# Patient Record
Sex: Female | Born: 1980
Health system: Southern US, Community
[De-identification: ages and names within clinical notes are randomized; demographics above are authoritative.]

## PROBLEM LIST (undated history)

## (undated) DIAGNOSIS — K8020 Calculus of gallbladder without cholecystitis without obstruction: Secondary | ICD-10-CM

## (undated) DIAGNOSIS — D649 Anemia, unspecified: Secondary | ICD-10-CM

---

## 1999-03-10 ENCOUNTER — Emergency Department (HOSPITAL_COMMUNITY): Admission: EM | Admit: 1999-03-10 | Discharge: 1999-03-10 | Payer: Self-pay | Admitting: Emergency Medicine

## 1999-09-17 ENCOUNTER — Emergency Department (HOSPITAL_COMMUNITY): Admission: EM | Admit: 1999-09-17 | Discharge: 1999-09-17 | Payer: Self-pay | Admitting: Emergency Medicine

## 1999-09-23 ENCOUNTER — Emergency Department (HOSPITAL_COMMUNITY): Admission: EM | Admit: 1999-09-23 | Discharge: 1999-09-23 | Payer: Self-pay | Admitting: Emergency Medicine

## 1999-10-28 ENCOUNTER — Inpatient Hospital Stay (HOSPITAL_COMMUNITY): Admission: AD | Admit: 1999-10-28 | Discharge: 1999-10-28 | Payer: Self-pay | Admitting: Obstetrics & Gynecology

## 1999-11-03 ENCOUNTER — Inpatient Hospital Stay (HOSPITAL_COMMUNITY): Admission: AD | Admit: 1999-11-03 | Discharge: 1999-11-03 | Payer: Self-pay | Admitting: Obstetrics & Gynecology

## 1999-11-22 ENCOUNTER — Inpatient Hospital Stay (HOSPITAL_COMMUNITY): Admission: AD | Admit: 1999-11-22 | Discharge: 1999-11-22 | Payer: Self-pay | Admitting: Obstetrics

## 1999-12-21 ENCOUNTER — Inpatient Hospital Stay (HOSPITAL_COMMUNITY): Admission: AD | Admit: 1999-12-21 | Discharge: 1999-12-21 | Payer: Self-pay | Admitting: *Deleted

## 1999-12-23 ENCOUNTER — Inpatient Hospital Stay (HOSPITAL_COMMUNITY): Admission: AD | Admit: 1999-12-23 | Discharge: 1999-12-23 | Payer: Self-pay | Admitting: *Deleted

## 2000-01-17 ENCOUNTER — Inpatient Hospital Stay (HOSPITAL_COMMUNITY): Admission: AD | Admit: 2000-01-17 | Discharge: 2000-01-17 | Payer: Self-pay | Admitting: *Deleted

## 2000-01-17 ENCOUNTER — Encounter: Payer: Self-pay | Admitting: *Deleted

## 2000-03-02 ENCOUNTER — Inpatient Hospital Stay (HOSPITAL_COMMUNITY): Admission: AD | Admit: 2000-03-02 | Discharge: 2000-03-02 | Payer: Self-pay | Admitting: *Deleted

## 2000-04-23 ENCOUNTER — Inpatient Hospital Stay (HOSPITAL_COMMUNITY): Admission: AD | Admit: 2000-04-23 | Discharge: 2000-04-23 | Payer: Self-pay | Admitting: Obstetrics

## 2000-05-22 ENCOUNTER — Inpatient Hospital Stay (HOSPITAL_COMMUNITY): Admission: AD | Admit: 2000-05-22 | Discharge: 2000-05-25 | Payer: Self-pay | Admitting: Obstetrics & Gynecology

## 2000-05-22 ENCOUNTER — Encounter: Payer: Self-pay | Admitting: Obstetrics & Gynecology

## 2000-06-08 ENCOUNTER — Encounter: Admission: RE | Admit: 2000-06-08 | Discharge: 2000-09-06 | Payer: Self-pay | Admitting: Obstetrics

## 2000-10-14 ENCOUNTER — Inpatient Hospital Stay (HOSPITAL_COMMUNITY): Admission: AD | Admit: 2000-10-14 | Discharge: 2000-10-16 | Payer: Self-pay | Admitting: *Deleted

## 2000-10-14 ENCOUNTER — Encounter: Payer: Self-pay | Admitting: Obstetrics and Gynecology

## 2002-06-30 ENCOUNTER — Encounter: Admission: RE | Admit: 2002-06-30 | Discharge: 2002-06-30 | Payer: Self-pay | Admitting: Family Medicine

## 2002-09-22 ENCOUNTER — Encounter: Admission: RE | Admit: 2002-09-22 | Discharge: 2002-09-22 | Payer: Self-pay | Admitting: Family Medicine

## 2003-07-30 ENCOUNTER — Emergency Department (HOSPITAL_COMMUNITY): Admission: EM | Admit: 2003-07-30 | Discharge: 2003-07-30 | Payer: Self-pay | Admitting: Emergency Medicine

## 2003-08-24 ENCOUNTER — Emergency Department (HOSPITAL_COMMUNITY): Admission: EM | Admit: 2003-08-24 | Discharge: 2003-08-25 | Payer: Self-pay | Admitting: Emergency Medicine

## 2004-04-14 ENCOUNTER — Ambulatory Visit: Payer: Self-pay | Admitting: Family Medicine

## 2004-05-05 ENCOUNTER — Ambulatory Visit: Payer: Self-pay | Admitting: Family Medicine

## 2004-05-11 ENCOUNTER — Ambulatory Visit (HOSPITAL_COMMUNITY): Admission: RE | Admit: 2004-05-11 | Discharge: 2004-05-11 | Payer: Self-pay | Admitting: *Deleted

## 2004-06-24 ENCOUNTER — Ambulatory Visit: Payer: Self-pay | Admitting: Family Medicine

## 2004-07-06 ENCOUNTER — Ambulatory Visit (HOSPITAL_COMMUNITY): Admission: RE | Admit: 2004-07-06 | Discharge: 2004-07-06 | Payer: Self-pay | Admitting: *Deleted

## 2004-10-13 ENCOUNTER — Ambulatory Visit: Payer: Self-pay | Admitting: Family Medicine

## 2004-10-13 ENCOUNTER — Encounter: Payer: Self-pay | Admitting: Family Medicine

## 2004-12-02 ENCOUNTER — Inpatient Hospital Stay (HOSPITAL_COMMUNITY): Admission: AD | Admit: 2004-12-02 | Discharge: 2004-12-02 | Payer: Self-pay | Admitting: Obstetrics & Gynecology

## 2004-12-12 ENCOUNTER — Inpatient Hospital Stay (HOSPITAL_COMMUNITY): Admission: AD | Admit: 2004-12-12 | Discharge: 2004-12-12 | Payer: Self-pay | Admitting: *Deleted

## 2004-12-26 ENCOUNTER — Inpatient Hospital Stay (HOSPITAL_COMMUNITY): Admission: AD | Admit: 2004-12-26 | Discharge: 2004-12-26 | Payer: Self-pay | Admitting: Family Medicine

## 2004-12-29 ENCOUNTER — Encounter (INDEPENDENT_AMBULATORY_CARE_PROVIDER_SITE_OTHER): Payer: Self-pay | Admitting: *Deleted

## 2004-12-29 LAB — CONVERTED CEMR LAB

## 2005-01-10 ENCOUNTER — Ambulatory Visit: Payer: Self-pay | Admitting: Family Medicine

## 2005-01-17 ENCOUNTER — Ambulatory Visit: Payer: Self-pay | Admitting: Sports Medicine

## 2005-02-21 ENCOUNTER — Ambulatory Visit: Payer: Self-pay | Admitting: Family Medicine

## 2005-03-10 ENCOUNTER — Ambulatory Visit: Payer: Self-pay | Admitting: Certified Nurse Midwife

## 2005-03-10 ENCOUNTER — Inpatient Hospital Stay (HOSPITAL_COMMUNITY): Admission: AD | Admit: 2005-03-10 | Discharge: 2005-03-10 | Payer: Self-pay | Admitting: Family Medicine

## 2005-03-17 ENCOUNTER — Ambulatory Visit (HOSPITAL_COMMUNITY): Admission: RE | Admit: 2005-03-17 | Discharge: 2005-03-17 | Payer: Self-pay | Admitting: Family Medicine

## 2005-03-21 ENCOUNTER — Ambulatory Visit: Payer: Self-pay | Admitting: Family Medicine

## 2005-04-01 ENCOUNTER — Inpatient Hospital Stay (HOSPITAL_COMMUNITY): Admission: AD | Admit: 2005-04-01 | Discharge: 2005-04-02 | Payer: Self-pay | Admitting: *Deleted

## 2005-04-05 ENCOUNTER — Inpatient Hospital Stay (HOSPITAL_COMMUNITY): Admission: AD | Admit: 2005-04-05 | Discharge: 2005-04-05 | Payer: Self-pay | Admitting: Obstetrics and Gynecology

## 2005-04-17 ENCOUNTER — Ambulatory Visit: Payer: Self-pay | Admitting: Family Medicine

## 2005-04-21 ENCOUNTER — Ambulatory Visit: Payer: Self-pay | Admitting: Family Medicine

## 2005-05-01 ENCOUNTER — Ambulatory Visit: Payer: Self-pay | Admitting: Sports Medicine

## 2005-05-17 ENCOUNTER — Ambulatory Visit: Payer: Self-pay | Admitting: Family Medicine

## 2005-05-30 ENCOUNTER — Ambulatory Visit: Payer: Self-pay | Admitting: Sports Medicine

## 2005-06-06 ENCOUNTER — Inpatient Hospital Stay (HOSPITAL_COMMUNITY): Admission: AD | Admit: 2005-06-06 | Discharge: 2005-06-06 | Payer: Self-pay | Admitting: Family Medicine

## 2005-06-09 ENCOUNTER — Ambulatory Visit: Payer: Self-pay | Admitting: Family Medicine

## 2005-06-12 ENCOUNTER — Inpatient Hospital Stay (HOSPITAL_COMMUNITY): Admission: AD | Admit: 2005-06-12 | Discharge: 2005-06-12 | Payer: Self-pay | Admitting: Family Medicine

## 2005-06-12 ENCOUNTER — Ambulatory Visit: Payer: Self-pay | Admitting: *Deleted

## 2005-06-14 ENCOUNTER — Ambulatory Visit: Payer: Self-pay | Admitting: Family Medicine

## 2005-06-16 ENCOUNTER — Ambulatory Visit: Payer: Self-pay | Admitting: Sports Medicine

## 2005-06-22 ENCOUNTER — Ambulatory Visit: Payer: Self-pay | Admitting: Sports Medicine

## 2005-06-27 ENCOUNTER — Ambulatory Visit: Payer: Self-pay | Admitting: *Deleted

## 2005-06-28 ENCOUNTER — Ambulatory Visit: Payer: Self-pay | Admitting: Family Medicine

## 2005-06-29 ENCOUNTER — Ambulatory Visit: Payer: Self-pay | Admitting: Obstetrics & Gynecology

## 2005-07-03 ENCOUNTER — Ambulatory Visit (HOSPITAL_COMMUNITY): Admission: RE | Admit: 2005-07-03 | Discharge: 2005-07-03 | Payer: Self-pay | Admitting: Family Medicine

## 2005-07-03 ENCOUNTER — Ambulatory Visit: Payer: Self-pay | Admitting: Obstetrics and Gynecology

## 2005-07-05 ENCOUNTER — Ambulatory Visit: Payer: Self-pay | Admitting: Family Medicine

## 2005-07-06 ENCOUNTER — Inpatient Hospital Stay (HOSPITAL_COMMUNITY): Admission: AD | Admit: 2005-07-06 | Discharge: 2005-07-09 | Payer: Self-pay | Admitting: Obstetrics & Gynecology

## 2005-07-06 ENCOUNTER — Ambulatory Visit: Payer: Self-pay | Admitting: Obstetrics & Gynecology

## 2005-07-06 ENCOUNTER — Encounter (INDEPENDENT_AMBULATORY_CARE_PROVIDER_SITE_OTHER): Payer: Self-pay | Admitting: Specialist

## 2005-07-11 ENCOUNTER — Ambulatory Visit: Payer: Self-pay | Admitting: *Deleted

## 2005-08-16 ENCOUNTER — Ambulatory Visit: Payer: Self-pay | Admitting: Sports Medicine

## 2006-08-23 DIAGNOSIS — D649 Anemia, unspecified: Secondary | ICD-10-CM | POA: Insufficient documentation

## 2006-08-23 DIAGNOSIS — Z87442 Personal history of urinary calculi: Secondary | ICD-10-CM | POA: Insufficient documentation

## 2006-08-23 DIAGNOSIS — F410 Panic disorder [episodic paroxysmal anxiety] without agoraphobia: Secondary | ICD-10-CM | POA: Insufficient documentation

## 2006-08-24 ENCOUNTER — Encounter (INDEPENDENT_AMBULATORY_CARE_PROVIDER_SITE_OTHER): Payer: Self-pay | Admitting: *Deleted

## 2007-04-10 ENCOUNTER — Encounter (INDEPENDENT_AMBULATORY_CARE_PROVIDER_SITE_OTHER): Payer: Self-pay | Admitting: Gynecology

## 2007-04-10 ENCOUNTER — Ambulatory Visit: Payer: Self-pay | Admitting: Gynecology

## 2008-07-21 ENCOUNTER — Inpatient Hospital Stay (HOSPITAL_COMMUNITY): Admission: AD | Admit: 2008-07-21 | Discharge: 2008-07-21 | Payer: Self-pay | Admitting: Obstetrics & Gynecology

## 2008-07-22 ENCOUNTER — Ambulatory Visit: Payer: Self-pay | Admitting: Obstetrics and Gynecology

## 2008-07-22 ENCOUNTER — Inpatient Hospital Stay (HOSPITAL_COMMUNITY): Admission: AD | Admit: 2008-07-22 | Discharge: 2008-07-22 | Payer: Self-pay | Admitting: Obstetrics & Gynecology

## 2008-09-08 ENCOUNTER — Ambulatory Visit: Payer: Self-pay | Admitting: Family Medicine

## 2008-09-08 ENCOUNTER — Encounter: Payer: Self-pay | Admitting: Family Medicine

## 2008-09-08 LAB — CONVERTED CEMR LAB
Antibody Screen: NEGATIVE
Basophils Absolute: 0 10*3/uL (ref 0.0–0.1)
Basophils Relative: 0 % (ref 0–1)
Eosinophils Absolute: 0.4 10*3/uL (ref 0.0–0.7)
Eosinophils Relative: 3 % (ref 0–5)
HCT: 35.5 % — ABNORMAL LOW (ref 36.0–46.0)
Hemoglobin: 12.1 g/dL (ref 12.0–15.0)
Hepatitis B Surface Ag: NEGATIVE
Lymphocytes Relative: 15 % (ref 12–46)
Lymphs Abs: 2.2 10*3/uL (ref 0.7–4.0)
MCHC: 34.1 g/dL (ref 30.0–36.0)
MCV: 88.1 fL (ref 78.0–100.0)
Monocytes Absolute: 0.4 10*3/uL (ref 0.1–1.0)
Monocytes Relative: 3 % (ref 3–12)
Neutro Abs: 11.3 10*3/uL — ABNORMAL HIGH (ref 1.7–7.7)
Neutrophils Relative %: 79 % — ABNORMAL HIGH (ref 43–77)
Platelets: 280 10*3/uL (ref 150–400)
RBC: 4.03 M/uL (ref 3.87–5.11)
RDW: 14.9 % (ref 11.5–15.5)
Rh Type: POSITIVE
Rubella: 58.2 intl units/mL — ABNORMAL HIGH
Sickle Cell Screen: NEGATIVE
WBC: 14.3 10*3/uL — ABNORMAL HIGH (ref 4.0–10.5)

## 2008-09-09 ENCOUNTER — Ambulatory Visit: Payer: Self-pay | Admitting: Family Medicine

## 2008-09-09 ENCOUNTER — Telehealth: Payer: Self-pay | Admitting: Family Medicine

## 2008-09-09 ENCOUNTER — Encounter: Payer: Self-pay | Admitting: Family Medicine

## 2008-09-21 ENCOUNTER — Encounter: Payer: Self-pay | Admitting: Family Medicine

## 2008-09-21 ENCOUNTER — Ambulatory Visit: Payer: Self-pay | Admitting: Family Medicine

## 2008-09-21 LAB — CONVERTED CEMR LAB
Chlamydia, DNA Probe: NEGATIVE
GC Probe Amp, Genital: NEGATIVE
Whiff Test: NEGATIVE

## 2008-09-24 ENCOUNTER — Encounter: Payer: Self-pay | Admitting: Family Medicine

## 2008-09-25 ENCOUNTER — Encounter: Payer: Self-pay | Admitting: Family Medicine

## 2008-10-08 ENCOUNTER — Encounter: Payer: Self-pay | Admitting: Family Medicine

## 2008-10-08 ENCOUNTER — Encounter: Payer: Self-pay | Admitting: *Deleted

## 2008-10-08 ENCOUNTER — Ambulatory Visit: Payer: Self-pay | Admitting: Family Medicine

## 2008-10-15 ENCOUNTER — Telehealth: Payer: Self-pay | Admitting: *Deleted

## 2008-10-21 ENCOUNTER — Telehealth (INDEPENDENT_AMBULATORY_CARE_PROVIDER_SITE_OTHER): Payer: Self-pay | Admitting: *Deleted

## 2008-10-23 ENCOUNTER — Ambulatory Visit (HOSPITAL_COMMUNITY): Admission: RE | Admit: 2008-10-23 | Discharge: 2008-10-23 | Payer: Self-pay | Admitting: Family Medicine

## 2008-11-05 ENCOUNTER — Encounter: Payer: Self-pay | Admitting: Family Medicine

## 2008-11-05 ENCOUNTER — Ambulatory Visit: Payer: Self-pay | Admitting: Family Medicine

## 2008-12-22 ENCOUNTER — Ambulatory Visit: Payer: Self-pay | Admitting: Family Medicine

## 2008-12-22 ENCOUNTER — Encounter: Payer: Self-pay | Admitting: Family Medicine

## 2008-12-25 ENCOUNTER — Encounter: Payer: Self-pay | Admitting: Family Medicine

## 2008-12-25 ENCOUNTER — Ambulatory Visit (HOSPITAL_COMMUNITY): Admission: RE | Admit: 2008-12-25 | Discharge: 2008-12-25 | Payer: Self-pay | Admitting: Family Medicine

## 2008-12-30 ENCOUNTER — Encounter: Payer: Self-pay | Admitting: Family Medicine

## 2008-12-30 ENCOUNTER — Ambulatory Visit: Payer: Self-pay | Admitting: Family Medicine

## 2009-01-05 ENCOUNTER — Ambulatory Visit: Payer: Self-pay | Admitting: Family Medicine

## 2009-01-05 ENCOUNTER — Encounter: Payer: Self-pay | Admitting: Family Medicine

## 2009-01-06 ENCOUNTER — Telehealth: Payer: Self-pay | Admitting: *Deleted

## 2009-01-08 ENCOUNTER — Ambulatory Visit: Payer: Self-pay | Admitting: Family Medicine

## 2009-01-08 ENCOUNTER — Encounter: Payer: Self-pay | Admitting: Family Medicine

## 2009-01-08 LAB — CONVERTED CEMR LAB
Bilirubin Urine: NEGATIVE
Blood in Urine, dipstick: NEGATIVE
Glucose, Urine, Semiquant: 500
Ketones, urine, test strip: NEGATIVE
Nitrite: NEGATIVE
Protein, U semiquant: 30
Specific Gravity, Urine: 1.025
Urobilinogen, UA: 0.2
WBC Urine, dipstick: NEGATIVE
pH: 6

## 2009-01-11 ENCOUNTER — Encounter: Payer: Self-pay | Admitting: *Deleted

## 2009-01-19 ENCOUNTER — Encounter: Payer: Self-pay | Admitting: Family Medicine

## 2009-01-19 ENCOUNTER — Ambulatory Visit: Payer: Self-pay | Admitting: Family Medicine

## 2009-01-20 ENCOUNTER — Encounter: Payer: Self-pay | Admitting: Family Medicine

## 2009-01-20 ENCOUNTER — Ambulatory Visit (HOSPITAL_COMMUNITY): Admission: RE | Admit: 2009-01-20 | Discharge: 2009-01-20 | Payer: Self-pay | Admitting: Internal Medicine

## 2009-01-25 ENCOUNTER — Encounter: Payer: Self-pay | Admitting: Family Medicine

## 2009-01-25 ENCOUNTER — Ambulatory Visit: Payer: Self-pay | Admitting: Obstetrics & Gynecology

## 2009-01-25 LAB — CONVERTED CEMR LAB
Chlamydia, DNA Probe: NEGATIVE
GC Probe Amp, Genital: NEGATIVE
HCT: 37.3 % (ref 36.0–46.0)
Hemoglobin: 12.6 g/dL (ref 12.0–15.0)
MCHC: 33.8 g/dL (ref 30.0–36.0)
MCV: 85.4 fL (ref 78.0–100.0)
Platelets: 248 10*3/uL (ref 150–400)
RBC: 4.37 M/uL (ref 3.87–5.11)
RDW: 17 % — ABNORMAL HIGH (ref 11.5–15.5)
WBC: 12.2 10*3/uL — ABNORMAL HIGH (ref 4.0–10.5)

## 2009-01-26 ENCOUNTER — Encounter: Payer: Self-pay | Admitting: Family Medicine

## 2009-02-01 ENCOUNTER — Ambulatory Visit: Payer: Self-pay | Admitting: Obstetrics & Gynecology

## 2009-02-03 ENCOUNTER — Telehealth (INDEPENDENT_AMBULATORY_CARE_PROVIDER_SITE_OTHER): Payer: Self-pay | Admitting: Family Medicine

## 2009-02-03 ENCOUNTER — Ambulatory Visit (HOSPITAL_COMMUNITY): Admission: RE | Admit: 2009-02-03 | Discharge: 2009-02-03 | Payer: Self-pay | Admitting: Family Medicine

## 2009-02-05 ENCOUNTER — Ambulatory Visit: Payer: Self-pay | Admitting: Obstetrics & Gynecology

## 2009-02-08 ENCOUNTER — Ambulatory Visit: Payer: Self-pay | Admitting: Cardiology

## 2009-02-08 ENCOUNTER — Ambulatory Visit: Payer: Self-pay | Admitting: Family Medicine

## 2009-02-10 ENCOUNTER — Ambulatory Visit: Payer: Self-pay

## 2009-02-10 ENCOUNTER — Ambulatory Visit: Payer: Self-pay | Admitting: Advanced Practice Midwife

## 2009-02-10 ENCOUNTER — Encounter: Payer: Self-pay | Admitting: Cardiology

## 2009-02-11 ENCOUNTER — Inpatient Hospital Stay (HOSPITAL_COMMUNITY): Admission: RE | Admit: 2009-02-11 | Discharge: 2009-02-13 | Payer: Self-pay | Admitting: Obstetrics & Gynecology

## 2009-02-20 ENCOUNTER — Inpatient Hospital Stay (HOSPITAL_COMMUNITY): Admission: AD | Admit: 2009-02-20 | Discharge: 2009-02-20 | Payer: Self-pay | Admitting: Obstetrics & Gynecology

## 2009-03-31 ENCOUNTER — Ambulatory Visit: Admission: RE | Admit: 2009-03-31 | Discharge: 2009-03-31 | Payer: Self-pay | Admitting: Obstetrics & Gynecology

## 2009-12-29 ENCOUNTER — Emergency Department (HOSPITAL_COMMUNITY): Admission: EM | Admit: 2009-12-29 | Discharge: 2009-12-29 | Payer: Self-pay | Admitting: Emergency Medicine

## 2010-04-29 ENCOUNTER — Encounter: Payer: Self-pay | Admitting: Family Medicine

## 2010-06-23 IMAGING — US US OB FOLLOW-UP
2 series · 14 of 28 positions shown · non-contrast
Comparison: none

OBSTETRICAL ULTRASOUND:
 This ultrasound exam was performed in the [HOSPITAL] Ultrasound Department.  The OB US report was generated in the AS system, and faxed to the ordering physician.  This report is also available in [REDACTED] PACS.

[Series 1: us ob follow up · 0.24mm/px · 13 of 30 slices shown (1 of 2)]
[im 2/30]
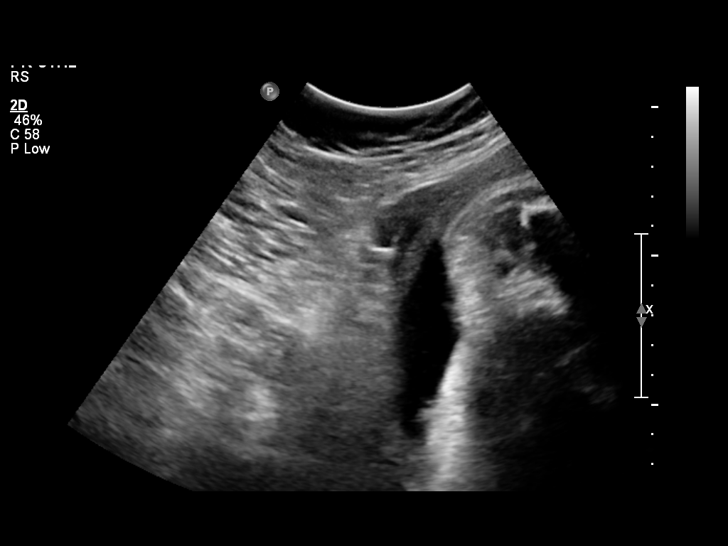
[im 4/30]
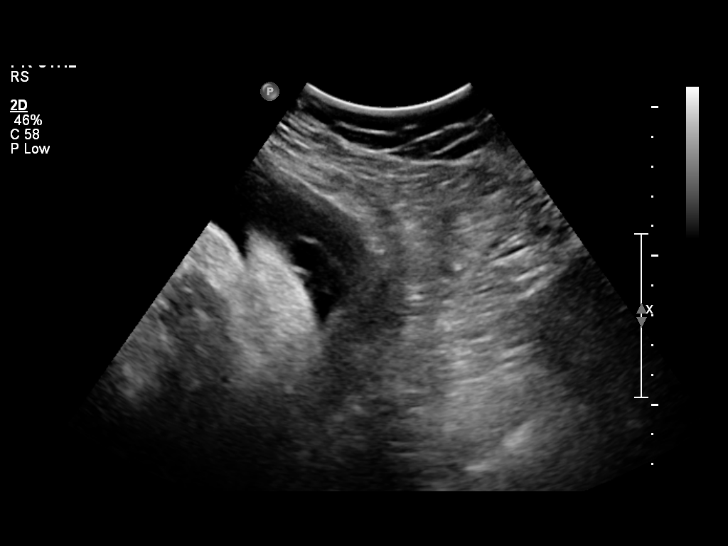
[im 6/30]
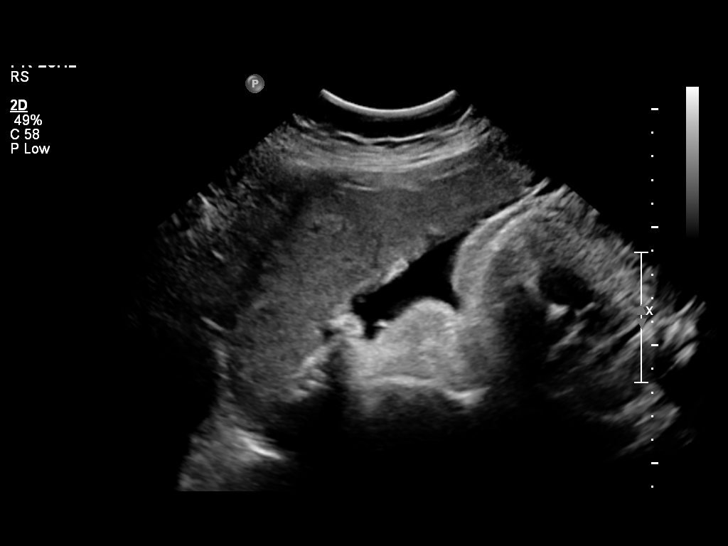
[im 9/30]
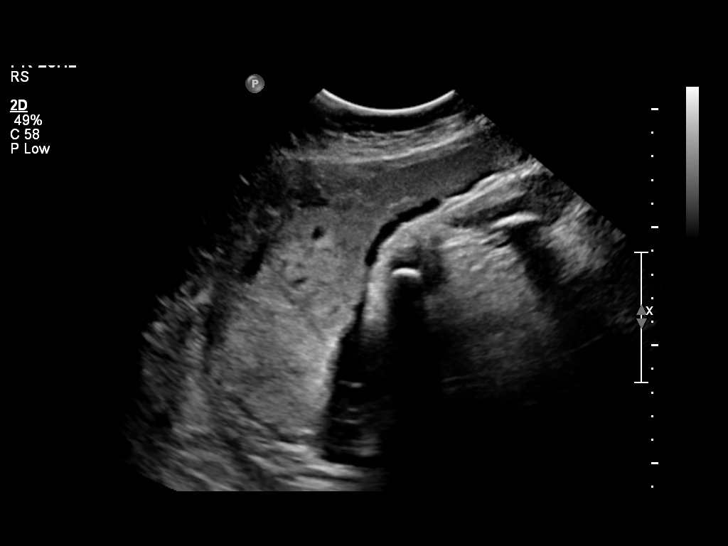
[im 11/30]
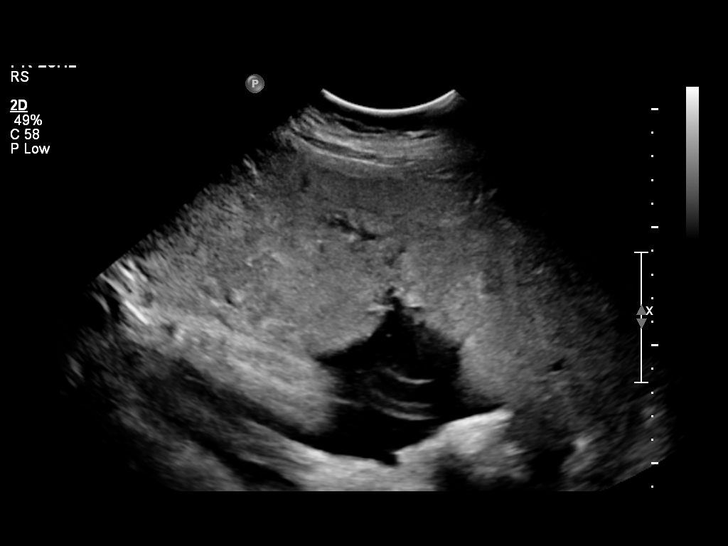
[im 13/30]
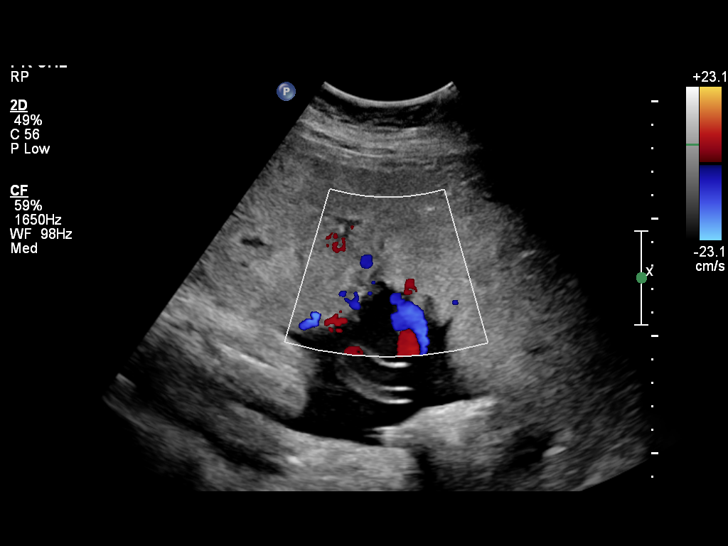
[im 16/30]
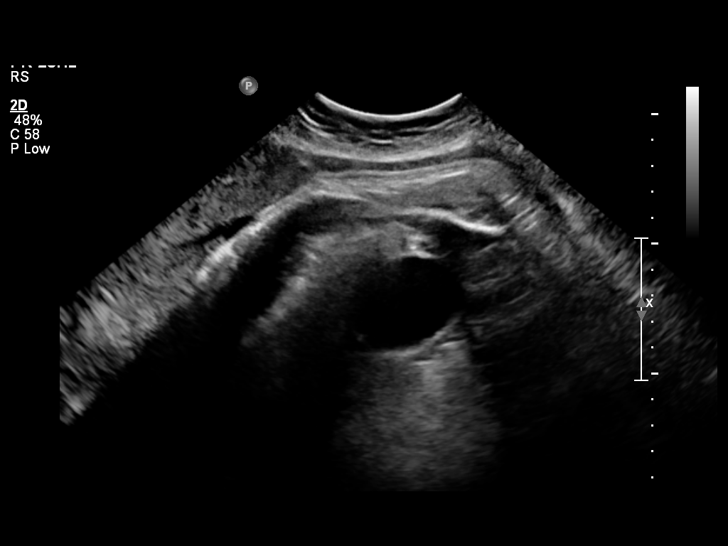
[im 18/30]
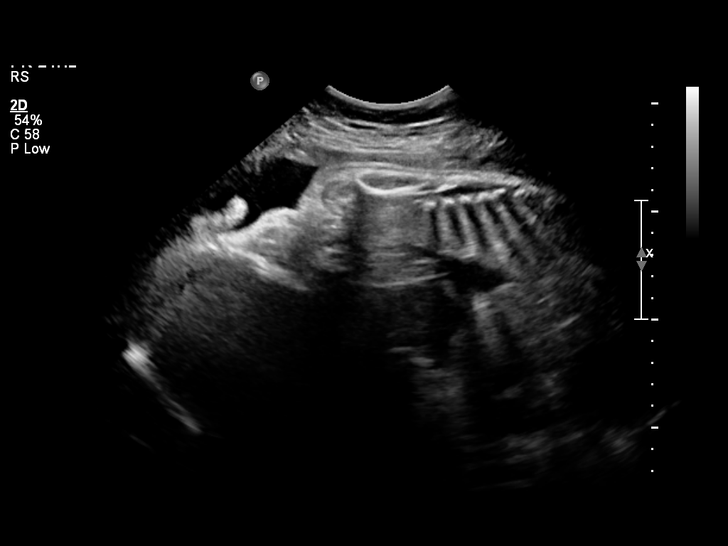
[im 20/30]
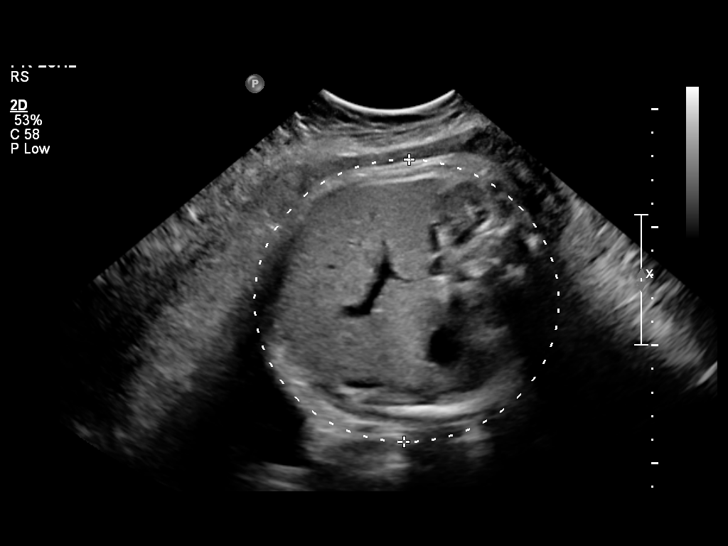
[im 23/30]
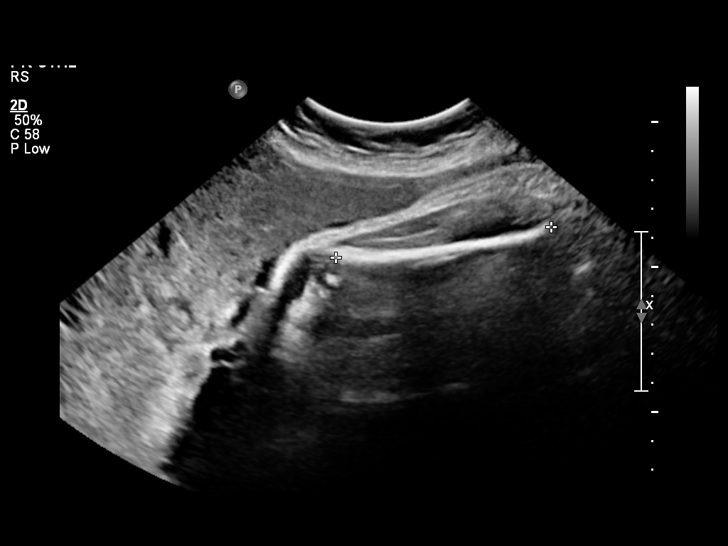
[im 25/30]
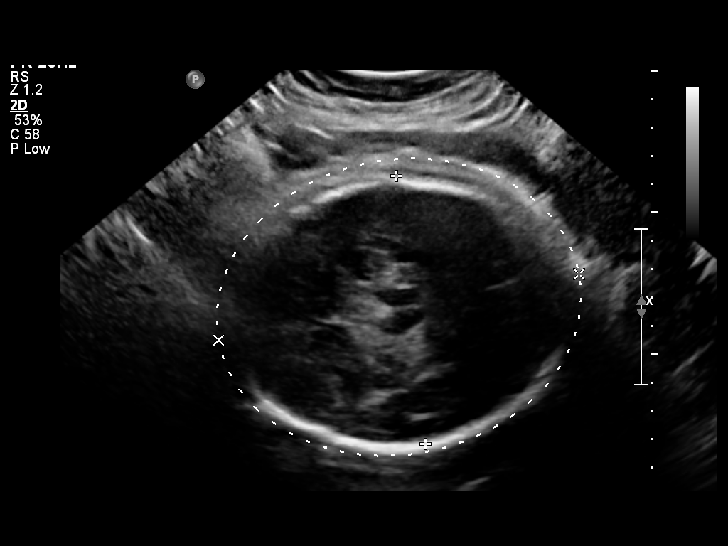
[im 27/30]
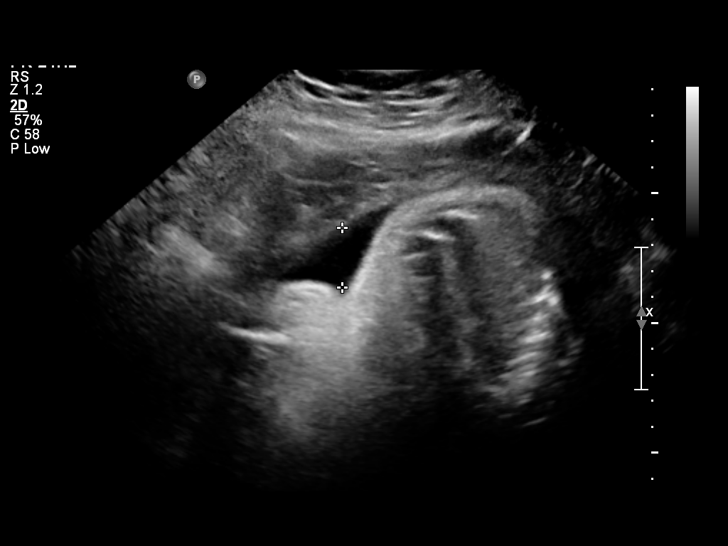
[im 30/30]
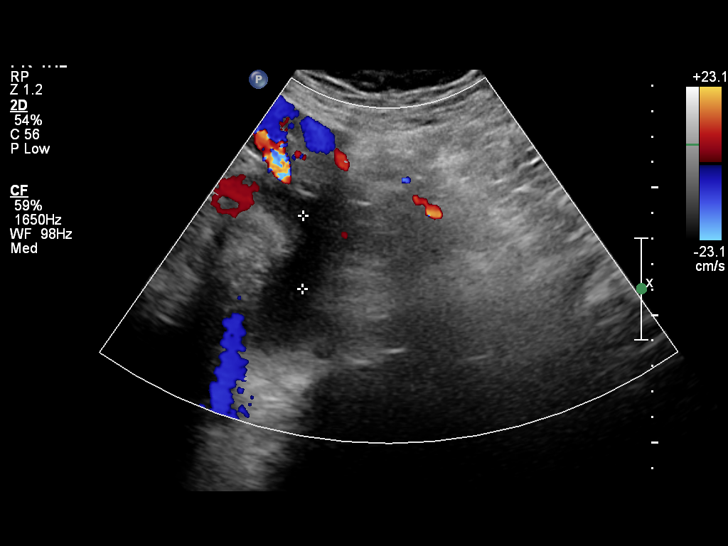

[Series 1: us ob follow up · 0.25mm/px · 1 of 2 slices shown (2 of 2)]
[im 2/2]
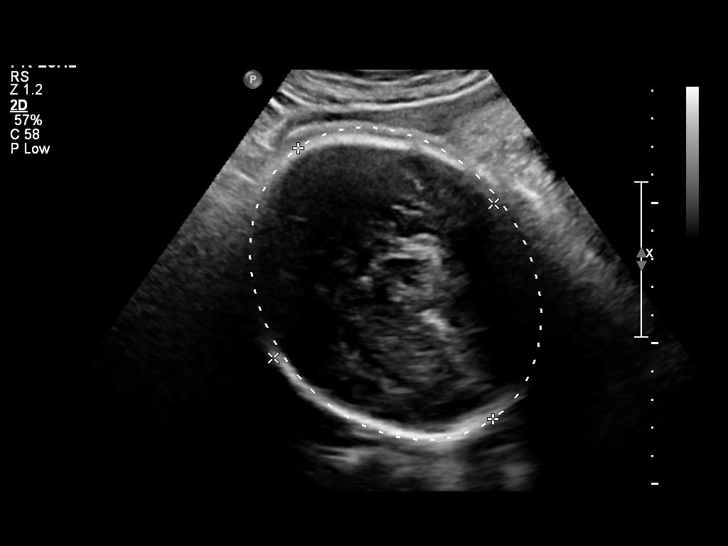

[14 of 28 positions shown; findings below may reference images not displayed]

IMPRESSION: See AS Obstetric US report.

## 2010-07-17 ENCOUNTER — Encounter: Payer: Self-pay | Admitting: Family Medicine

## 2010-07-17 ENCOUNTER — Encounter: Payer: Self-pay | Admitting: *Deleted

## 2010-07-26 NOTE — Assessment & Plan Note (Signed)
Summary: no fetal movement in 2 days. 17 wks preg   Vital Signs:  Patient profile:   30 year old female Weight:      233.1 pounds BP sitting:   111 / 63  (left arm)  Vitals Entered By: Starleen Blue RN (September 09, 2008 1:55 PM) CC: no fetal movement Is Patient Diabetic? No Pain Assessment Patient in pain? no        History of Present Illness: 30 yo G3P2002 (one birth by c/s was  ~3 weeks early so possible one preterm) presents at 92 2/7 wks by 1st trimester U/s on 1/26 - EDC 02/10/09 (not concordant with her LMP of 05/19/08) for no fetal movement.  She states that about 2-3 weeks ago she started feeling some fluttering in her abdomen and felt that every day until 2 days ago - since has not felt any movement in this area.  It felt previously like her other babies did when they were moving in utero.  No vaginal bleeding, leakage of fluid (some mucus only).  No abdominal cramping past 2 days.  First OB appointment is at end of month with Dr. Lelon Perla.  Physical Exam  General:  Well-developed, obese, NAD, pleasant Abdomen:  soft, gravid. Feel fundus above umbilicus - ? large for dates.  FHTs 155 without irregularity.   Impression & Recommendations:  Problem # 1:  DECR FETAL MOVMNTS MGMT MOTH ANTPRTM COND/COMP (ICD-655.73) Assessment New  Doppler fetal heart with strong heartbeat at 155 bpm.  Still early in pregnancy to feel fetal movement - ? if she felt gas though could be intermittent fetal movement.  Advised her not to worry at this point unless vaginal bleeding, leakage of fluid, intense abdominal pain.  Keep appt with Dr. Lelon Perla.  Orders: [facility]- Est Level  3 (16109)  Patient Instructions: 1)  If you develop any of the following, [facility]: 2)  1. Contractions that are regularly spaced 3)  2. Any vaginal bleeding 4)  3. If your water broke or you feel a big gush of fluid 5)  Otherwise keep your appointment at the end of the month with Dr.  Lelon Perla. 6)  It may be days to a few weeks before you feel the baby move regularly but the heart rate is normal and the baby sounds great! 7)  The medication list was reviewed and reconciled.  All changed / newly prescribed medications were explained.  A complete medication list was provided to the patient / caregiver.       OB Initial Intake Information    Race: Hispanic    Marital status: Married   Marketing executive for Follow-up Visit    Weight:     233.1    Blood pressure:   111 / 63

## 2010-07-26 NOTE — Letter (Signed)
Summary: [facility] Obstetrics Management Note  Womens Obstetrics Management Note   Imported By: Roderic Ovens 03/08/2009 16:36:15  _____________________________________________________________________  External Attachment:    Type:   Image     Comment:   External Document

## 2010-07-26 NOTE — Assessment & Plan Note (Signed)
Summary: ob   History of Present Illness: 1. Had multiple questions including: Felt very tired yesterday, if pregnant pts have a faster heart rate, what she should be eating and how much she should be exercising   Flowsheet View for Follow-up Visit    Estimated weeks of       gestation:     20.2    Weight:     234    Blood pressure:   110 / 67    Hx headache?     No    Nausea/vomiting?   nausea    Edema?     0    Bleeding?     no    Leakage/discharge?   d/c    Fetal activity:       yes    Labor symptoms?   no    Fundal height:      20    FHR:       150    Taking Vitamins?   Y    Next visit:     4 wk    Preceptor:     Chambliss  Physical Examination  Vital Signs:  BP (upright): 110/67  Wt: 234    General Exam:  Constitutional:    alert, no acute distress, well hydrated, and OBESE APPEARING.   Skin:    normal turgor and normal color.   Cardiovascular:    RRR.   Respiratory:    no respiratory distress.   Abdomen:    gravid and PRIOR C/S SCAR.    Impression & Recommendations:  Problem # 1:  SUPERVISION OF OTHER NORMAL PREGNANCY (ICD-V22.1) Progressing normally.  Hx. of GDM.  Borderline 1 hour GTT.  Will recheck at next visit. Orders: Ultrasound (Ultrasound) Other OB visit- [facility] (OBCK)  Complete Medication List: 1)  Pnv 27-0.6-0.4 Mg Tabs (Prenatal vit w/fe-methylfol-fa) .Marland Kitchen.. 1 tab by mouth daily 2)  Calcium Carbonate-vitamin D 600-200 Mg-unit Tabs (Calcium carbonate-vitamin d) .... Take 1 tab by mouth daily 3)  Monistat 7 2 % Crea (Miconazole nitrate) .... Apply in vagina as directed  Patient Instructions: 1)  Follow up in 4 weeks for pregnancy 2)  Everything else is giong well 3)  We will let you know when your u/s is 4)  Will recheck for Diabetes at next visit  Flowsheet View for Follow-up Visit    Estimated weeks of       gestation:     20.2    Weight:     234    Blood pressure:   110 / 67    Headache:     No    Nausea/vomiting:   nausea    Edema:      0    Vaginal bleeding:   no    Vaginal discharge:   d/c    Fundal height:      20    FHR:       150    Fetal activity:     yes    Labor symptoms:   no    Taking prenatal vits?   Y    Next visit:     4 wk    Preceptor:     Chambliss   Appended Document: ob   OB Initial Intake Information    Positive HCG by: self    Race: Hispanic    Marital status: Married    Occupation: homemaker    Education (last grade completed): 1 year of college    Number of  children at home: 2    [facility] delivery[facility]  FOB Information    Husband/Father of baby: Korrin Waterfield  Menstrual History    LMP (date): 05/19/2008    On BCP's at conception: no  Prenatal Visit Concerns noted: Discuss with pt. how sure she is of her LMP and if her periods are regular.  If she is sure and they are regular than use LMP for [facility]. EDC Confirmation: Ultrasound Dating Information:    First U/S on 10/23/2008   Gest age: 58 weeks 2 days   EDC: 02/10/2009.

## 2010-07-26 NOTE — Assessment & Plan Note (Signed)
Summary: ob f/u  Prenatal Visit Concerns noted: Was concerned about eating shrimp.  Also thought that she was gaining to much weight. EDC Confirmation:    New working [facility]: 02/22/2009    LMP reliable? YES    Last menses onset (LMP) date: 05/18/2008    EDC by LMP: 02/22/2009 Ultrasound Dating Information:    First U/S on 10/23/2008   Gest age: 31 weeks 2 days   EDC: 02/10/2009.  Vital Signs:  Patient profile:   30 year old female LMP:     05/18/2008 Weight:      239.7 pounds Pulse rate:   103 / minute BP sitting:   110 / 72  (left arm) Cuff size:   large  Vitals Entered By: Garen Grams LPN (Nov 05, 2008 3:14 PM)  CC: ob f/u Pain Assessment Patient in pain? no      LMP (date): 05/18/2008 EDC by LMP==> 02/22/2009 EDC 02/22/2009 LMP - Reliable? YES  years   days  On BCP's at conception: no Enter LMP: 05/18/2008 Last PAP Result NEGATIVE FOR INTRAEPITHELIAL LESIONS OR MALIGNANCY.   Flowsheet View for Follow-up Visit    Estimated weeks of       gestation:     24 3/7    Weight:     239.7    Blood pressure:   110 / 72    Hx headache?     No    Nausea/vomiting?   No    Edema?     0    Bleeding?     no    Leakage/discharge?   no    Fetal activity:       yes    Labor symptoms?   no    Fundal height:      27    FHR:       150    Taking Vitamins?   Y    Next visit:     4 wk    Preceptor:     Mauricio Po  Physical Examination  Vital Signs:  P: 103  BP (upright): 110/72  Wt: 239.7    General Exam:  Constitutional:    alert and OBESE APPEARING.   Skin:    normal turgor and normal color.   Cardiovascular:    RRR.   Respiratory:    no respiratory distress.   Abdomen:    gravid, nondistended, and nontender.   Psychiatric:    normal interaction.    Impression & Recommendations:  Problem # 1:  SUPERVISION OF OTHER NORMAL PREGNANCY (ICD-V22.1) Assessment Unchanged  Progressing normally.  Will use LMP for dating.  Will recheck her for Gestational Diabetes at next  visit.  Orders: Other OB visit- [facility] (OBCK)  Complete Medication List: 1)  Pnv 27-0.6-0.4 Mg Tabs (Prenatal vit w/fe-methylfol-fa) .Marland Kitchen.. 1 tab by mouth daily 2)  Calcium Carbonate-vitamin D 600-200 Mg-unit Tabs (Calcium carbonate-vitamin d) .... Take 1 tab by mouth daily  Patient Instructions: 1)  Please schedule a follow up appt. in 4 weeks 2)  We will recheck you for diabetes at that visit so please plan to be here for that extra hour. 3)  We will use 02/22/09 as your due date.

## 2010-07-26 NOTE — Assessment & Plan Note (Signed)
Summary: np6/dizziness/palps/PT 20 WEEKS   Visit Type:  new pt visit Primary Provider:  Norton Blizzard MD  CC:  pt c/o heart racing..pt is [redacted] weeks gestation..her due date is this Thursday.  History of Present Illness: I was asked by Dr. Penne Lash to evaluate this very pleasant 30 year old female with palpitations.  This is her third pregnancy but the first time she's really had any problems with palpitations. They began approximately 4-6 weeks ago. They occur spontaneously. Sometimes she gets lightheaded with this but has not had syncope. There is no association with any particular activity or position. She is now in her third trimester and is due this Thursday.  She denies any extra caffeine use, tobacco use, recreational drug use or alcohol. She has had no complications with previous pregnancies. Her first delivery was vaginal the second was a C-section.  Her last hemoglobin was 12.1 in March. She does have some mild gestational diabetes.  She denies orthopnea, PND or peripheral edema except for some mild pedal edema in the day. She's had no significant dyspnea on exertion.  Current Medications (verified): 1)  Pnv 27-0.6-0.4 Mg Tabs (Prenatal Vit W/fe-Methylfol-Fa) .Marland Kitchen.. 1 Tab By Mouth Daily 2)  Calcium Carbonate-Vitamin D 600-200 Mg-Unit Tabs (Calcium Carbonate-Vitamin D) .... Take 1 Tab By Mouth Daily  Allergies (verified): 1)  ! Pcn  Past History:  Past Medical History: Last updated: 02/05/2009 `heavy periods` controlled w/Nuvaring, 3h GTT 89/164/163/124, fasting CBG 90, 110 during pregancy, IOL at 39 wks w/G1, macrosomia w/ G1 (10lbs, 6 oz) PYELONEPHRITIS (ICD-590.80) HEMATURIA UNSPECIFIED (ICD-599.70) PANIC ATTACKS (ICD-300.01) ABNORMAL MATERNAL GLUCOSE TOLERANCE ANTEPARTUM (ICD-648.83) DECR FETAL MOVMNTS MGMT MOTH ANTPRTM COND/COMP (ICD-655.73) SUPERVISION OF OTHER NORMAL PREGNANCY (ICD-V22.1) OBESITY, NOS (ICD-278.00) NEPHROLITHIASIS (ICD-592.0) ANEMIA, OTHER, UNSPECIFIED  (ICD-285.9)  Past Surgical History: Last updated: 02/05/2009 G2 abruption at 37wks- emergent C/S - 07/07/2005, HBV neg, RPR NR, HIV NR - 01/17/2005, Hgb 12.1, Plt 275, - 01/17/2005, O+ Ab neg - 01/17/2005  Family History: Last updated: 02/05/2009 brother- rheumatoid arthritis, father- MI, uncle-DM  Social History: Last updated: 02/05/2009 From [place].  Married to [place], in [place] 10+ yrs. Husband walked out on Pt late 12/06.  [facility]. No tob/etoh/drugs.  Unemployed.  Son Channing Mutters healthy, born 2002  Risk Factors: Packs/Day: n/a (01/19/2009)  Review of Systems       negative other than the history of present illness  Vital Signs:  Patient profile:   30 year old female Height:      65 inches Weight:      251 pounds BMI:     41.92 Pulse rate:   86 / minute Pulse rhythm:   regular BP sitting:   98 / 56  (left arm) Cuff size:   large  Vitals Entered By: Danielle Rankin, CMA (February 08, 2009 3:32 PM)  Physical Exam  General:  very pleasant, no acute distress, very pregnant! Head:  normocephalic and atraumatic Eyes:  PERRLA/EOM intact; conjunctiva and lids normal. Mouth:  Teeth, gums and palate normal. Oral mucosa normal. Neck:  Neck supple, no JVD. No masses, thyromegaly or abnormal cervical nodes. Lungs:  Clear bilaterally to auscultation and percussion. Heart:  Non-displaced PMI, chest non-tender; regular rate and rhythm, S1, S2 without murmurs, rubs or gallops. Carotid upstroke normal, no bruit. Normal abdominal aortic size, no bruits. Femorals normal pulses, no bruits. Pedals normal pulses. No edema, no varicosities. Abdomen:  gravida, no extensive exam done Msk:  Back normal, normal gait. Muscle strength and tone normal. Pulses:  pulses normal in all 4 extremities Extremities:  trace left pedal edema and trace right pedal edema.   Neurologic:  Alert and oriented x 3. Skin:  Intact without lesions or rashes. Psych:  Normal affect.   EKG  Procedure  date:  02/08/2009  Findings:      normal sinus rhythm, normal EKG  Impression & Recommendations:  Problem # 1:  PALPITATIONS, OCCASIONAL (ICD-785.1) Assessment New  Orders: Echocardiogram (Echo) EKG w/ Interpretation (93000) We will also check thyroid studies, potassium and magnesium. We need to make sure her 2-D echocardiogram is unremarkable prior to delivery. We will get it scheduled as soon as possible.  Patient Instructions: 1)  Your physician has requested that you have an echocardiogram.  Echocardiography is a painless test that uses sound waves to create images of your heart. It provides your doctor with information about the size and shape of your heart and how well your heart's chambers and valves are working.  This procedure takes approximately one hour. There are no restrictions for this procedure.

## 2010-07-26 NOTE — Progress Notes (Signed)
Summary: phn msg  Phone Note Call from Patient Call back at [phone]   Caller: Patient Summary of Call: pt wants to know if she should continue to come to [facility] Dr Lelon Perla - is not sure if she should go to both.  pls advise Initial call taken by: De Nurse,  February 03, 2009 2:33 PM  Follow-up for Phone Call        Please inform pt. that she only needs to go to [facility] but to please have them call Dr. Lelon Perla when she goes into Labor     Appended Document: phn msg Pt informed of the above

## 2010-07-26 NOTE — Miscellaneous (Signed)
Summary: pap letter  Clinical Lists Changes pap letter returned. no trailor number so they could not deliver it.Will tell pt results when she calls & get full address.Marland KitchenMarland KitchenGolden Circle RN  October 08, 2008 2:10 PM

## 2010-07-26 NOTE — Assessment & Plan Note (Signed)
Summary: blood when urinating x1 today/[place]   Vital Signs:  Patient profile:   30 year old female Weight:      249 pounds Temp:     97.7 degrees F oral Pulse rate:   106 / minute BP sitting:   109 / 71  Vitals Entered By: Jone Baseman CMA (January 08, 2009 12:02 PM) CC: blood in urine   Primary Care Provider:  Norton Blizzard MD  CC:  blood in urine.  History of Present Illness: 1. Blood in Urine:  Pt. woke up this morning and went to the bathroom and noticed a small amount of blood in the toilet after voiding.  She also wiped three times and saw some red blood on the tissue paper. Last night pt. felt a little pressure but denies any contractions, leakage of fluid.  Pt. did have sex with her husband last night.  Pt. denies any pelvic or abdominal pain, no vaginal discharge, no fevers.  Endorses good fetal movement.  Has also been more fatigued than usual.  Pt. used the bathroom in clinic for a UA and noticed no blood this time.   Physical Exam: Vital signs reviewed Gen: obese, alert in no acute distress CV: regular rate and rhythm  Resp: normal work of breathing Abd: soft, non-tender, gravid, consistent with dates Preg: measures 40cm, FHT : 155   Habits & Providers  Alcohol-Tobacco-Diet     Cigarette Packs/Day: n/a  Current Problems (verified): 1)  Hematuria Unspecified  (ICD-599.70) 2)  Abnormal Maternal Glucose Tolerance Antepartum  (ZOX-096.04) 3)  Decr Fetal Movmnts Mgmt Moth Antprtm Cond/comp  (ICD-655.73) 4)  Supervision of Other Normal Pregnancy  (ICD-V22.1) 5)  Panic Attacks  (ICD-300.01) 6)  Obesity, Nos  (ICD-278.00) 7)  Nephrolithiasis  (ICD-592.0) 8)  Anemia, Other, Unspecified  (ICD-285.9)  Current Medications (verified): 1)  Pnv 27-0.6-0.4 Mg Tabs (Prenatal Vit W/fe-Methylfol-Fa) .Marland Kitchen.. 1 Tab By Mouth Daily 2)  Calcium Carbonate-Vitamin D 600-200 Mg-Unit Tabs (Calcium Carbonate-Vitamin D) .... Take 1 Tab By Mouth Daily  Past History:  Past Surgical  History: Last updated: 08/23/2006 G2 abruption at 37wks- emergent C/S - 07/07/2005, HBV neg, RPR NR, HIV NR - 01/17/2005, Hgb 12.1, Plt 275, - 01/17/2005, O+ Ab neg - 01/17/2005  Social History: Packs/Day:  n/a   Impression & Recommendations:  Problem # 1:  HEMATURIA UNSPECIFIED (ICD-599.70) Assessment New Likely from cervical irritation from intercourse.  Told pt. to monitor it closely and RTC if worse or continues.  Also given labor precautions. Orders: Urinalysis-[facility] (00000) Other OB visit- [facility] (OBCK)  Problem # 2:  SUPERVISION OF OTHER NORMAL PREGNANCY (ICD-V22.1) Assessment: Unchanged  Continue routine care.  Pt. needs to be evaluated at [facility] for TOLAC couseling, borderline GDM, large baby.  Asked nursing staff to f/u on her appt, since she hasn't heard from the [facility] yet.  Orders: Other OB visit- [facility] (OBCK)  Complete Medication List: 1)  Pnv 27-0.6-0.4 Mg Tabs (Prenatal vit w/fe-methylfol-fa) .Marland Kitchen.. 1 tab by mouth daily 2)  Calcium Carbonate-vitamin D 600-200 Mg-unit Tabs (Calcium carbonate-vitamin d) .... Take 1 tab by mouth daily  Patient Instructions: 1)  Please keep your regular scheduled OB appt. 2)  I think that the bleeding is most likely from having sex last night and having some cervical irritation. 3)  Keep an eye on the bleeding, if it continues or if it gets worse please return to the clinic or to the the [facility] right away. 4)  Also go to the [facility] if  you start having signs of labor such as contractions and leakage of fluid 5)  We are contacting the OB clinic at the [facility] to have you seen over there.  You should hear from them by early next week.  Laboratory Results   Urine Tests  Date/Time Received: January 08, 2009 11:59 AM  Date/Time Reported: January 08, 2009 1:44 PM   Routine Urinalysis   Color: yellow Appearance: Clear Glucose: 500   (Normal Range: Negative) Bilirubin: negative   (Normal Range: Negative) Ketone: negative    (Normal Range: Negative) Spec. Gravity: 1.025   (Normal Range: 1.003-1.035) Blood: negative   (Normal Range: Negative) pH: 6.0   (Normal Range: 5.0-8.0) Protein: 30   (Normal Range: Negative) Urobilinogen: 0.2   (Normal Range: 0-1) Nitrite: negative   (Normal Range: Negative) Leukocyte Esterace: negative   (Normal Range: Negative)  Urine Microscopic WBC/HPF: 1-5 RBC/HPF: rare Bacteria/HPF: 2+ Mucous/HPF: trace Epithelial/HPF: 5-10 Crystals/HPF: occ calcium oxalate    Comments: ...........test performed by...........Marland KitchenTerese Door, CMA      OB Initial Intake Information    Positive HCG by: self    Race: Hispanic    Marital status: Married    Occupation: homemaker    Education (last grade completed): 1 year of college    Number of children at home: 2    [facility]ospital [facility] delivery: [facility]  FOB Information    Husband/Father of baby: Alonnah Lampkins  Menstrual History    LMP (date): 05/18/2008    On BCP's at conception: no   Flowsheet View for Follow-up Visit    Estimated weeks of       gestation:     33 4/7    Weight:     249    Blood pressure:   109 / 71    Urine Protein:     30    Urine Glucose:   500    Urine Nitrite:     negative    Headache:     No    Nausea/vomiting:   No    Edema:     0    Vaginal bleeding:   yes    Vaginal discharge:   no    Fundal height:      40    FHR:       155    Fetal activity:     yes    Labor symptoms:   no    Taking prenatal vits?   Y    Smoking:     n/a    Next visit:     as scheduled in 2 weeks    Resident:     Lelon Perla    Preceptor:     Mauricio Po

## 2010-07-26 NOTE — Progress Notes (Signed)
----   Converted from flag ---- ---- 01/06/2009 9:14 AM, Garen Grams LPN wrote: Record has been faxed to [facility], they will set up appt date/time.  ---- 01/06/2009 8:47 AM, Angelena Sole MD wrote: Please schedule pt. for OB appt. at [facility] for TOLAC vs. c/section counseling.  Thanks.  ---- 01/05/2009 2:49 PM, Sarah Swaziland MD wrote: Milana Kidney, I think these are good thing to be concerned about.  She certainly does not meet the criteria for GDM, but she probably has some insulin resistance.  I would advise her to watch out for concentrated sweets, for starchy foods, etc (the advice you would give a diabetic).  For the [facility], she needs an appt with the OBs at [facility] to discuss.  We try to schedule these around 30 weeks, but we could send her now.  They will do the consent for a TOLAC or they will schedule her for a repeat section.  I'm sure that you could advise the pt of the risks and benefits of a TOLAC, but since the OBs are ultimately responsible, they prefer to do it.  She is not actually LGA (the definition is 90 or 97%ile, depending on who you ask).  Also, ultrasound is not very good at predicting fetal weight, so I wouldn't put too much stock in it.  Another reliable estimate of fetal weight is the mother's assessment of the baby - is this one larger or smaller than previous ones?  This may well be a large baby.  I think if she continues to measure larger in clinic than we would expect, then serial growth scans would be fine just to monitor things.  (Maybe every 3 weeks).   I hope this helps. I don't she needs high risk at this point, (just the [facility] appt for the [facility] counseling), but I am glad you have are thinking about her issues.  Let me know if you have other questions! SJ   ---- 01/05/2009 1:11 PM, Angelena Sole MD wrote: Karl Luke, could you take a look at Ms. Llerenas and her info.  There are a couple things that are making me uncomfortable in managing her.  1) Borderline GDM only  1/4 positive 3hour GTT but a 1HourGTT of 151.  2) TOLAC - she had a c/s with last pregnancy at 37 weeks.  I had her schedule an appt. with OB clinic in 2 weeks.  Please evaluate and determine if she should be referred to high risk.  Thank you. ------------------------------

## 2010-07-26 NOTE — Assessment & Plan Note (Signed)
Summary: ob f/u   Flowsheet View for Follow-up Visit    Estimated weeks of       gestation:     31 1/7    Weight:     245    Blood pressure:   95 / 59    Hx headache?     No    Nausea/vomiting?   No    Edema?     0    Bleeding?     no    Leakage/discharge?   no    Fetal activity:       yes    Labor symptoms?   no    Fundal height:      40    FHR:       145    Taking Vitamins?   Y    Comment:     Measuring large will repeat u/s    Next visit:     2 wk    Resident:     Lelon Perla    Preceptor:     Jennette Kettle  Physical Examination  Vital Signs:  BP (upright): 95/59  Wt: 245    Primary Care Provider:  Norton Blizzard MD   History of Present Illness: Complaining of fatigue, otherwise is doing well   Physical Exam: Vital signs reviewed Gen: obese, alert in no acute distress CV: regular rate and rhythm without murmurs Resp: clear to auscultation bilaterally, normal work of breathing Abd: soft, non-tender, gravid, consistent with dates Ext: trace LE edema Psych: not depressed appearing    Impression & Recommendations:  Problem # 1:  SUPERVISION OF OTHER NORMAL PREGNANCY (ICD-V22.1) Assessment Deteriorated Measuring large will repeat u/s for growth Orders: Glucose 1 hr-[facility] ([address]) Ultrasound (Ultrasound) Other OB visit- [facility] (OBCK)  Complete Medication List: 1)  Pnv 27-0.6-0.4 Mg Tabs (Prenatal vit w/fe-methylfol-fa) .Marland Kitchen.. 1 tab by mouth daily 2)  Calcium Carbonate-vitamin D 600-200 Mg-unit Tabs (Calcium carbonate-vitamin d) .... Take 1 tab by mouth daily  Patient Instructions: 1)  We will schedule you for another ultrasound to check your babies growth 2)  Please follow up with me in 2 weeks  Flowsheet View for Follow-up Visit    Estimated weeks of       gestation:     31 1/7    Weight:     245    Blood pressure:   95 / 59    Headache:     No    Nausea/vomiting:   No    Edema:     0    Vaginal bleeding:   no    Vaginal discharge:   no    Fundal height:      40     FHR:       145    Fetal activity:     yes    Labor symptoms:   no    Taking prenatal vits?   Y    Next visit:     2 wk    Resident:     Lelon Perla    Preceptor:     Jennette Kettle    Comment:     Measuring large will repeat u/s   OB Ultrasound Data Entry   Ultrasound Date:     10/23/2008   Indication:       Fetal growth   Fetal number:       1            Dating:   Gest age by [place]:  24W 1D         EDC by [place] Gest age:     02/10/2009   Impression:   Impression:       Normal u/s Best Working [facility]:    02/22/2009

## 2010-07-26 NOTE — Progress Notes (Signed)
Summary: phn msg  Phone Note Call from Patient Call back at [phone] or [phone]   Caller: Patient Summary of Call: calling to find out when her ulrasound is. Initial call taken by: Clydell Hakim,  October 15, 2008 12:20 PM  Follow-up for Phone Call        Patient had appt scheduled for yesterday. Called [facility] to reschedule, awaiting callback from [facility] then will call patient with new appt. Follow-up by: Garen Grams LPN,  October 15, 2008 2:02 PM  Additional Follow-up for Phone Call Additional follow up Details #1::        Appt scheduled for 4/28 at 2:30pm, tried calling to inform, number busy x 3 Additional Follow-up by: Garen Grams LPN,  October 15, 2008 5:01 PM    Additional Follow-up for Phone Call Additional follow up Details #2::    Tried calling again, number constantly busy and number in chart disconnected. Cannot send letter, see previous clinical list update. Will have to await callback from patient, please give her the above appt info when she calls. Follow-up by: Garen Grams LPN,  October 16, 2008 11:00 AM  Additional Follow-up for Phone Call Additional follow up Details #3:: Details for Additional Follow-up Action Taken: Patient husband informed Additional Follow-up by: Garen Grams LPN,  October 19, 2008 3:39 PM

## 2010-07-26 NOTE — Miscellaneous (Signed)
  Clinical Lists Changes  Problems: Removed problem of PALPITATIONS, OCCASIONAL (ICD-785.1) Removed problem of PYELONEPHRITIS (ICD-590.80) Removed problem of HEMATURIA UNSPECIFIED (ICD-599.70) Removed problem of ABNORMAL MATERNAL GLUCOSE TOLERANCE ANTEPARTUM (ZSW-109.32) Removed problem of DECR FETAL MOVMNTS MGMT MOTH ANTPRTM COND/COMP (ICD-655.73) Removed problem of SUPERVISION OF OTHER NORMAL PREGNANCY (ICD-V22.1) Changed problem from NEPHROLITHIASIS (ICD-592.0) to NEPHROLITHIASIS, HX OF (ICD-V13.01)

## 2010-07-26 NOTE — Letter (Signed)
Summary: Generic Letter  [facility]  [address]   [place], [place] [address]   Phone: [phone]  Fax: [phone]    09/24/2008  [address] [place], [place]  [address]  Dear Ms. Topper,    Your PAP smear was normal       Sincerely,   Angelena Sole MD [facility]  Appended Document: Generic Letter Mailed

## 2010-07-26 NOTE — Progress Notes (Signed)
Summary: pregnancy - no movement  Phone Note Call from Patient Call back at Home Phone [phone]   Caller: Patient Summary of Call: pt at [phone] and is new OB - she has not felt baby move in past 2 days.  Pt was told by [facility] to call us first, if we cant see her then she should call them back.   Initial call taken by: Rae Roam,  September 09, 2008 11:51 AM  Follow-up for Phone Call        [redacted] weeks pregnant. has been feeling movement for the past 2 weeks, but none in 2 days./ she will be here at 1:30pm to see pcp Follow-up by: Golden Circle RN,  September 09, 2008 11:53 AM  Additional Follow-up for Phone Call Additional follow up Details #1::        Discussed with Dr. Mauricio Po - will do fetal doppler, if no tones heard, will order ultrasound today.  Ok to come here given she is at non-viable stage of pregnancy - if > 20 weeks, she would need to be at [facility] and not be seen here first, especially if she has been feeling the baby move regularly up to that point. Additional Follow-up by: Norton Blizzard MD,  September 09, 2008 12:31 PM

## 2010-07-26 NOTE — Assessment & Plan Note (Signed)
Summary: ob   Flowsheet View for Follow-up Visit    Estimated weeks of       gestation:     35 1/7    Weight:     248.8    Blood pressure:   112 / 73    Hx headache?     No    Nausea/vomiting?   No    Edema?     0    Bleeding?     no    Leakage/discharge?   no    Fetal activity:       yes    Labor symptoms?   no    Fundal height:      40    FHR:       150    Taking Vitamins?   Y    Smoking PPD:   n/a    Comment:     Pt. will be seen by OB/GYN to discuss birth plan    Next visit:     1 wk    Resident:     Lelon Perla    Preceptor:     [place] s  Physical Examination  Vital Signs:  BP (upright): 112/73  Wt: 248.8    General Exam:  Constitutional:    alert and OBESE APPEARING.   Skin:    normal turgor.   Eyes:    EOM intact, no injection, and no icterus.   Mouth:    good dentition.   Cardiovascular:    RRR and no murmurs.   Respiratory:    no respiratory distress and clear to auscultation.   Abdomen:    gravid.  measuring large for dates  Impression & Recommendations:  Problem # 1:  SUPERVISION OF OTHER NORMAL PREGNANCY (ICD-V22.1) Assessment Unchanged Overall doing well.  Baby is still measuring large.  Will get a repeat u/s.  Pt. with meeting with OB/GYN to discuss TOLAC and delivery plan. Orders: Ultrasound (Ultrasound) Other OB visit- [facility] (OBCK)  Problem # 2:  ABNORMAL MATERNAL GLUCOSE TOLERANCE ANTEPARTUM (ZOX-096.04) Assessment: Unchanged  Pt. is trying to watch her diet.  Orders: Other OB visit- [facility] (OBCK)  Problem # 3:  OBESITY, NOS (ICD-278.00) Assessment: Improved  Weight is stable.  Orders: Other OB visit- [facility] (OBCK)  Complete Medication List: 1)  Pnv 27-0.6-0.4 Mg Tabs (Prenatal vit w/fe-methylfol-fa) .Marland Kitchen.. 1 tab by mouth daily 2)  Calcium Carbonate-vitamin D 600-200 Mg-unit Tabs (Calcium carbonate-vitamin d) .... Take 1 tab by mouth daily  Patient Instructions: 1)  Go to [facility] for an appt. with Dr. Adine Madura 01/20/09 at  10am to discuss delivery plan. 2)  We will get another ultrasound to monitor the baby's size. 3)  Schedule a follow up appt. in 1 week.  Flowsheet View for Follow-up Visit    Estimated weeks of       gestation:     35 1/7    Weight:     248.8    Blood pressure:   112 / 73    Headache:     No    Nausea/vomiting:   No    Edema:     0    Vaginal bleeding:   no    Vaginal discharge:   no    Fundal height:      40    FHR:       150    Fetal activity:     yes    Labor symptoms:   no    Taking prenatal  vits?   Y    Smoking:     n/a    Next visit:     1 wk    Resident:     Lelon Perla    Preceptor:     [place]    Comment:     Pt. will be seen by OB/GYN to discuss birth plan

## 2010-07-26 NOTE — Miscellaneous (Signed)
Summary: pregnant & bleeding  Clinical Lists Changes saw blood this am when urinating. none since. no pain or contractions. she will come in before 11am to see her pcp.Golden Circle RN  January 08, 2009 9:57 AM

## 2010-07-26 NOTE — Assessment & Plan Note (Signed)
Flowsheet View for Follow-up Visit    Estimated weeks of       gestation:     33 1/7    Weight:     247    Blood pressure:   104 / 71    Hx headache?     No    Nausea/vomiting?   No    Edema?     0    Bleeding?     no    Leakage/discharge?   no    Fetal activity:       yes    Labor symptoms?   no    Fundal height:      40    FHR:       145    Taking Vitamins?   Y    Comment:     Pt. with risk factors.  Borderline GDM attempting TOLAC. Consider referral to high risk.    Next visit:     2 wk    Resident:     Lelon Perla    Preceptor:     [place]  Physical Examination  Vital Signs:  BP (upright): 104/71  Wt: 247    Impression & Recommendations:  Problem # 1:  SUPERVISION OF OTHER NORMAL PREGNANCY (ICD-V22.1) Assessment Unchanged  Pt. with borderline GDM only 1 positive test on the 3 hour GTT.  Baby is also measuring 3 weeks ahead of LMP and is at 80%.  Mom would like a TOLAC.  Will have pt. seen in OB clinic and determine if she needs to be referred to high risk.  Orders: Other OB visit- [facility] (OBCK)  Complete Medication List: 1)  Pnv 27-0.6-0.4 Mg Tabs (Prenatal vit w/fe-methylfol-fa) .Marland Kitchen.. 1 tab by mouth daily 2)  Calcium Carbonate-vitamin D 600-200 Mg-unit Tabs (Calcium carbonate-vitamin d) .... Take 1 tab by mouth daily  Patient Instructions: 1)  Please schedule a follow up appt. in 2 weeks with the [facility] clinic  Flowsheet View for Follow-up Visit    Estimated weeks of       gestation:     33 1/7    Weight:     247    Blood pressure:   104 / 71    Headache:     No    Nausea/vomiting:   No    Edema:     0    Vaginal bleeding:   no    Vaginal discharge:   no    Fundal height:      40    FHR:       145    Fetal activity:     yes    Labor symptoms:   no    Taking prenatal vits?   Y    Next visit:     2 wk    Resident:     Lelon Perla    Preceptor:     [place]    Comment:     Pt. with risk factors.  Borderline GDM attempting TOLAC. Consider referral to high  risk.    OB Ultrasound Data Entry   Ultrasound Date:     12/25/2008   Indication:       Fetal growth   Fetal number:       1              Amniotic Fl Index (cm):   13.51        Measurements:   Est Fetal Weight (g):     2478  Dating:   Gest age by [place]:     72W 5D         EDC by [place] Gest age:     01/31/2009   Impression:   Impression:       LGA Best Working [facility]:    02/22/2009  Appended Document:  Given interest in TOLAC, would refer for OB visit to discuss/consent on this.

## 2010-07-26 NOTE — Miscellaneous (Signed)
Summary: appt at [facility]. Tell her at 01/19/09 visit  Clinical Lists Changes rec'd call from [facility] at [facility]. she is to see Dr. Adine Madura 01/20/09 at 10am to discuss delivery plan. none of the pt's contact numbers are valid. she has an appt here 01/19/09 & we can give her that info then.Marland KitchenMarland KitchenGolden Circle RN  January 11, 2009 4:27 PM  Patient informed...............................................Marland KitchenGaren Grams LPN January 19, 2009 11:08 AM

## 2010-07-26 NOTE — Assessment & Plan Note (Signed)
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    Symptoms since LMP: nausea  Physical Exam  General:  alert and well-hydrated, obese.   Head:  normocephalic and atraumatic.   Eyes:  vision grossly intact.   Nose:  no external deformity and no nasal discharge.   Lungs:  normal respiratory effort, no crackles, and no wheezes.   Heart:  normal rate and regular rhythm.   Abdomen:  obese, soft, non-tender Genitalia:  normal introitus and no external lesions, .  normal introitus, no external lesions, mucosa pink and moist, no vaginal or cervical lesions, no vaginal atrophy, and no adnexal masses or tenderness.    Pelvic Exam  Vulva:      normal appearance.   Vagina:      normal.   Cervix:      normal, no lesions.   Uterus:      non-tender, gravid.   Adnexa:      normal, no masses bilaterally, nontender bilaterally.      History of Present Illness: 1. Vaginal Itching: has been present for 3 weeks.  Has white discharge.  Feels like a "diaper rash" Still is sexually active  2. Cramps: Is having cramps when she is mad or stressed.  Located in lower pelvis, near round ligament  3. Pain in belly button: was present 2 days ago.  Only lasted for 30 minutes to 1 hour.  Only happened once.    4. Vagina very dry:  This has been going on for 3 months.  It feels like she has a rash.  It bothers her that it is dry all the time.  She had a pap smear about a year ago.  Bothers her during sex but that is not her main concern.  Would like a cream or gel for it.   Vital  Signs:  Patient profile:   30 year old female LMP:     05/19/2008 Weight:      236.8 pounds Temp:     98.1 degrees F oral Pulse rate:   101 / minute BP sitting:   113 / 73  (left arm) Cuff size:   large  Vitals Entered By: Garen Grams LPN (September 21, 2008 10:55 AM)  Pain Assessment Patient in pain? no      LMP (date): 05/19/2008 EDC by LMP==> 02/23/2009  years   days  On BCP's at conception: no Enter LMP: 05/19/2008 Last PAP Result Done.   Past History:  Past Medical History:    `heavy periods` controlled w/Nuvaring, 3h GTT 89/164/163/124, fasting CBG 90, 110 during pregancy, IOL at 39 wks w/G1, macrosomia w/ G1 (10lbs, 6 oz) (08/23/2006)  Past Surgical History:    G2 abruption at 37wks- emergent C/S - 07/07/2005, HBV neg, RPR NR, HIV NR - 01/17/2005, Hgb 12.1, Plt 275, - 01/17/2005, O+ Ab neg - 01/17/2005 (08/23/2006)  Social History:    From [place].  Married to [place], in [place] 10+ yrs. Husband  walked out on Pt late 12/06.  [facility]. No tob/etoh/drugs.  Unemployed.  Son Nancy [name] healthy, born 2002 (08/23/2006)  Past Pregnancy History    Gravida:     3    Term Births:     2    Premature Births:   0    Living Children:   2    Para:       2    Mult. Births:     0    Prev C-Section:   1    Aborta:     0    Elect. Ab:     0    Spont. Ab:     0    Ectopics:     0  Pregnancy # 1    Delivery date:     05/23/2000    Weeks Gestation:   39    Delivery type:     NSVD    Anesthesia type:     epidural    Delivery location:     [facility]    Infant Sex:     Female    Birth weight:     10#    Name:     Nancy [name]    Comments:     No complications  Pregnancy # 2    Delivery date:     07/06/2005    Weeks Gestation:   37    Delivery type:     C-section    Anesthesia type:     epidural    Delivery location:     [facility]    Infant Sex:     Female    Birth weight:     8#13    Name:     Nancy [name]    Comments:     Gestational Diabetes of the last two months - was on some  medicines Emergency c-section for placental abruption  Social History:    Education:  1 year of college    Hepatitis Risk:  no  Genetic History     Thalassemia:     mother: no    Neural tube defect:   mother: no    Down's Syndrome:   mother: no    Tay-Sachs:     mother: no    Sickle Cell Dz/Trait:   mother: no    Hemophilia:     mother: no    Muscular Dystrophy:   mother: no    Cystic Fibrosis:   mother: no    Huntington's Dz:   mother: no    Mental Retardation:   mother: no    Fragile X:     mother: no    Other Genetic or       Chromosomal Dz:   mother: no    Child with other       birth defect:     mother: no    > 3 spont. abortions:   mother: no    Hx of stillbirth:     mother: no  Infection Risk History    High Risk Hepatitis B: no    Immunized against Hepatitis B: no    Exposure to TB: no    Patient with history of Genital Herpes: no    Sexual partner with history of Genital Herpes: no    History of STD (GC, Chlamydia, Syphilis, HPV): no    Rash, Viral, or Febrile Illness since LMP: no    Exposure to Cat Litter: no  [employer] for  Follow-up Visit    Estimated weeks of       gestation:     17.6    Weight:     236.8    Blood pressure:   113 / 73    Hx headache?     No    Nausea/vomiting?   nausea    Edema?     0    Bleeding?     no    Leakage/discharge?   d/c    Fetal activity:       yes    Labor symptoms?   no    Fundal height:      20    FHR:       145    Cx dilation:     0    Cx effacement:   0%    Fetal station:     -3    Taking Vitamins?   Y    Next visit:     4 wk    Preceptor:     [place]  Physical Examination  Vital Signs:  T: 98.1 oral  P: 101  BP (upright): 113/73  Wt: 236.8    General Exam:  Constitutional:    alert.  Well-developed, obese, NAD, pleasant  Impression & Recommendations:  Problem # 1:  SUPERVISION OF OTHER NORMAL PREGNANCY (ICD-V22.1) Assessment New Anatomy Scan scheduled Orders: Glucose 1 hr-[facility] ([address]) Pap  Smear-[facility] (60454-09811) Wet Prep- [facility] ([address]) GC/Chlamydia-[facility] (87591/87491) AFP/Quad Scr-[facility] (29562-13086) Other OB visit- [facility] (OBCK)  Problem # 2:  OBESITY, NOS (ICD-278.00) Check 1 hour GTT today.  Attempting to improve diet.  Complete Medication List: 1)  Pnv 27-0.6-0.4 Mg Tabs (Prenatal vit w/fe-methylfol-fa) .Marland Kitchen.. 1 tab by mouth daily 2)  Calcium Carbonate-vitamin D 600-200 Mg-unit Tabs (Calcium carbonate-vitamin d) .... Take 1 tab by mouth daily 3)  Monistat 7 2 % Crea (Miconazole nitrate) .... Apply in vagina as directed  Patient Instructions: 1)  For your vaginal itching your probably have a yeast infection.  Go to the pharmacy and get Miconazole cream (Monistat) apply it inside your vagina as directed for a 7 day course 2)  For your vaginal dryness try any of the lubricating jellies.  [place] jelly would be fine. 3)  We will let you know of any abnormal results. Prenatal Visit    FOB name: Cynitha Berte [facility] Confirmation:    Last menses onset (LMP) date: 05/19/2008    EDC by LMP: 02/23/2009 Ultrasound Dating Information:    First U/S on 07/21/2008   Gest age: 59   EDC: 02/16/2009.    Laboratory Results  Date/Time Received: September 21, 2008 11:50 AM  Date/Time Reported: September 21, 2008 12:01 PM   [employer] Source: vag WBC/hpf: >20 Bacteria/hpf: 3+  Rods Clue cells/hpf: none  Negative whiff Yeast/hpf: many Trichomonas/hpf: none Comments: ...............test performed by......Marland KitchenBonnie A. [place], [place] (ASCP)     Flowsheet View for Follow-up Visit    Estimated weeks of       gestation:     17.6    Weight:     236.8    Blood pressure:   113 / 73    Headache:     No    Nausea/vomiting:   nausea    Edema:     0    Vaginal bleeding:   no    Vaginal discharge:   d/c    Fundal height:      20    FHR:       145  Fetal activity:     yes    Labor symptoms:   no    Cx Dilation:     0    Cx Effacement:   0%    Cx Station:     -3    Taking prenatal vits?   Y    Next  visit:     4 wk    Preceptor:     [place]

## 2010-07-26 NOTE — Progress Notes (Signed)
Summary: resch appt  Phone Note Call from Patient Call back at [phone]   Caller: Patient Summary of Call: pt's husband doesn't want her to go to [facility], they want to go to [facility] to have the U/S done.  she cancelled her appt at [facility] for today... Initial call taken by: De Nurse,  October 21, 2008 2:37 PM    Rescheduled at [facility] for 4/30, at 1;00.  Pt. informed.  Starleen Blue RN  October 21, 2008 4:54 PM

## 2010-07-26 NOTE — Miscellaneous (Signed)
Summary: Consent for VBAC  Consent for VBAC   Imported By: Clydell Hakim 01/27/2009 14:51:31  _____________________________________________________________________  External Attachment:    Type:   Image     Comment:   External Document

## 2010-10-01 LAB — POCT URINALYSIS DIP (DEVICE)
Bilirubin Urine: NEGATIVE
Glucose, UA: NEGATIVE mg/dL
Glucose, UA: NEGATIVE mg/dL
Glucose, UA: NEGATIVE mg/dL
Hgb urine dipstick: NEGATIVE
Hgb urine dipstick: NEGATIVE
Hgb urine dipstick: NEGATIVE
Ketones, ur: NEGATIVE mg/dL
Ketones, ur: NEGATIVE mg/dL
Ketones, ur: NEGATIVE mg/dL
Nitrite: NEGATIVE
Nitrite: NEGATIVE
Nitrite: NEGATIVE
Protein, ur: 30 mg/dL — AB
Protein, ur: NEGATIVE mg/dL
Protein, ur: NEGATIVE mg/dL
Specific Gravity, Urine: 1.025 (ref 1.005–1.030)
Specific Gravity, Urine: 1.01 (ref 1.005–1.030)
Specific Gravity, Urine: 1.025 (ref 1.005–1.030)
Urobilinogen, UA: 0.2 mg/dL (ref 0.0–1.0)
Urobilinogen, UA: 0.2 mg/dL (ref 0.0–1.0)
Urobilinogen, UA: 0.2 mg/dL (ref 0.0–1.0)
pH: 5.5 (ref 5.0–8.0)
pH: 5.5 (ref 5.0–8.0)
pH: 5.5 (ref 5.0–8.0)

## 2010-10-01 LAB — CBC
HCT: 32.2 % — ABNORMAL LOW (ref 36.0–46.0)
HCT: 37.8 % (ref 36.0–46.0)
HCT: 37.9 % (ref 36.0–46.0)
Hemoglobin: 11.2 g/dL — ABNORMAL LOW (ref 12.0–15.0)
Hemoglobin: 12.6 g/dL (ref 12.0–15.0)
Hemoglobin: 13.1 g/dL (ref 12.0–15.0)
MCHC: 33.4 g/dL (ref 30.0–36.0)
MCHC: 34.7 g/dL (ref 30.0–36.0)
MCHC: 34.7 g/dL (ref 30.0–36.0)
MCV: 88.2 fL (ref 78.0–100.0)
MCV: 89.3 fL (ref 78.0–100.0)
MCV: 89.8 fL (ref 78.0–100.0)
Platelets: 220 10*3/uL (ref 150–400)
Platelets: 221 10*3/uL (ref 150–400)
Platelets: 303 10*3/uL (ref 150–400)
RBC: 3.61 MIL/uL — ABNORMAL LOW (ref 3.87–5.11)
RBC: 4.22 MIL/uL (ref 3.87–5.11)
RBC: 4.29 MIL/uL (ref 3.87–5.11)
RDW: 15.1 % (ref 11.5–15.5)
RDW: 16.5 % — ABNORMAL HIGH (ref 11.5–15.5)
RDW: 16.9 % — ABNORMAL HIGH (ref 11.5–15.5)
WBC: 12.8 10*3/uL — ABNORMAL HIGH (ref 4.0–10.5)
WBC: 12.9 10*3/uL — ABNORMAL HIGH (ref 4.0–10.5)
WBC: 7.2 10*3/uL (ref 4.0–10.5)

## 2010-10-01 LAB — COMPREHENSIVE METABOLIC PANEL
ALT: 76 U/L — ABNORMAL HIGH (ref 0–35)
AST: 70 U/L — ABNORMAL HIGH (ref 0–37)
Albumin: 3.2 g/dL — ABNORMAL LOW (ref 3.5–5.2)
Alkaline Phosphatase: 119 U/L — ABNORMAL HIGH (ref 39–117)
BUN: 13 mg/dL (ref 6–23)
CO2: 24 mEq/L (ref 19–32)
Calcium: 9 mg/dL (ref 8.4–10.5)
Chloride: 106 mEq/L (ref 96–112)
Creatinine, Ser: 0.56 mg/dL (ref 0.4–1.2)
GFR calc Af Amer: >60 mL/min (ref >60)
GFR calc non Af Amer: >60 mL/min (ref >60)
Glucose, Bld: 84 mg/dL (ref 70–99)
Potassium: 4.1 mEq/L (ref 3.5–5.1)
Sodium: 137 mEq/L (ref 135–145)
Total Bilirubin: 0.5 mg/dL (ref 0.3–1.2)
Total Protein: 7.3 g/dL (ref 6.0–8.3)

## 2010-10-01 LAB — URINE MICROSCOPIC-ADD ON

## 2010-10-01 LAB — URINALYSIS, ROUTINE W REFLEX MICROSCOPIC
Bilirubin Urine: NEGATIVE
Bilirubin Urine: NEGATIVE
Glucose, UA: NEGATIVE mg/dL
Glucose, UA: NEGATIVE mg/dL
Ketones, ur: NEGATIVE mg/dL
Ketones, ur: NEGATIVE mg/dL
Nitrite: NEGATIVE
Nitrite: NEGATIVE
Protein, ur: NEGATIVE mg/dL
Protein, ur: NEGATIVE mg/dL
Specific Gravity, Urine: 1.01 (ref 1.005–1.030)
Specific Gravity, Urine: 1.01 (ref 1.005–1.030)
Urobilinogen, UA: 0.2 mg/dL (ref 0.0–1.0)
Urobilinogen, UA: 0.2 mg/dL (ref 0.0–1.0)
pH: 5.5 (ref 5.0–8.0)
pH: 5.5 (ref 5.0–8.0)

## 2010-10-01 LAB — GLUCOSE, CAPILLARY: Glucose-Capillary: 110 mg/dL — ABNORMAL HIGH (ref 70–99)

## 2010-10-01 LAB — DIFFERENTIAL
Basophils Absolute: 0 10*3/uL (ref 0.0–0.1)
Basophils Relative: 1 % (ref 0–1)
Eosinophils Absolute: 0.3 10*3/uL (ref 0.0–0.7)
Eosinophils Relative: 4 % (ref 0–5)
Lymphocytes Relative: 23 % (ref 12–46)
Lymphs Abs: 1.7 10*3/uL (ref 0.7–4.0)
Monocytes Absolute: 0.7 10*3/uL (ref 0.1–1.0)
Monocytes Relative: 9 % (ref 3–12)
Neutro Abs: 4.7 10*3/uL (ref 1.7–7.7)
Neutrophils Relative %: 64 % (ref 43–77)

## 2010-10-01 LAB — RPR: RPR Ser Ql: NONREACTIVE

## 2010-10-01 LAB — CCBB MATERNAL DONOR DRAW

## 2010-10-02 LAB — GLUCOSE, CAPILLARY: Glucose-Capillary: 101 mg/dL — ABNORMAL HIGH (ref 70–99)

## 2010-10-03 LAB — GLUCOSE, CAPILLARY: Glucose-Capillary: 151 mg/dL — ABNORMAL HIGH (ref 70–99)

## 2010-10-06 LAB — GLUCOSE, CAPILLARY: Glucose-Capillary: 130 mg/dL — ABNORMAL HIGH (ref 70–99)

## 2010-10-10 LAB — CBC
HCT: 36.4 % (ref 36.0–46.0)
HCT: 36.6 % (ref 36.0–46.0)
Hemoglobin: 12.4 g/dL (ref 12.0–15.0)
Hemoglobin: 12.6 g/dL (ref 12.0–15.0)
MCHC: 33.8 g/dL (ref 30.0–36.0)
MCHC: 34.7 g/dL (ref 30.0–36.0)
MCV: 87.7 fL (ref 78.0–100.0)
MCV: 89.9 fL (ref 78.0–100.0)
Platelets: 215 10*3/uL (ref 150–400)
Platelets: 239 10*3/uL (ref 150–400)
RBC: 4.07 MIL/uL (ref 3.87–5.11)
RBC: 4.15 MIL/uL (ref 3.87–5.11)
RDW: 12.5 % (ref 11.5–15.5)
RDW: 13.3 % (ref 11.5–15.5)
WBC: 13.9 10*3/uL — ABNORMAL HIGH (ref 4.0–10.5)
WBC: 14.7 10*3/uL — ABNORMAL HIGH (ref 4.0–10.5)

## 2010-10-10 LAB — URINALYSIS, ROUTINE W REFLEX MICROSCOPIC
Bilirubin Urine: NEGATIVE
Glucose, UA: 100 mg/dL — AB
Hgb urine dipstick: NEGATIVE
Ketones, ur: 15 mg/dL — AB
Nitrite: NEGATIVE
Protein, ur: NEGATIVE mg/dL
Specific Gravity, Urine: 1.02 (ref 1.005–1.030)
Urobilinogen, UA: 0.2 mg/dL (ref 0.0–1.0)
pH: 6.5 (ref 5.0–8.0)

## 2010-10-10 LAB — DIFFERENTIAL
Band Neutrophils: 9 % (ref 0–10)
Basophils Absolute: 0 10*3/uL (ref 0.0–0.1)
Basophils Absolute: 0 10*3/uL (ref 0.0–0.1)
Basophils Relative: 0 % (ref 0–1)
Basophils Relative: 0 % (ref 0–1)
Blasts: 0 %
Eosinophils Absolute: 0.3 10*3/uL (ref 0.0–0.7)
Eosinophils Absolute: 0.3 10*3/uL (ref 0.0–0.7)
Eosinophils Relative: 2 % (ref 0–5)
Eosinophils Relative: 2 % (ref 0–5)
Lymphocytes Relative: 16 % (ref 12–46)
Lymphocytes Relative: 22 % (ref 12–46)
Lymphs Abs: 2.4 10*3/uL (ref 0.7–4.0)
Lymphs Abs: 3.1 10*3/uL (ref 0.7–4.0)
Metamyelocytes Relative: 0 %
Monocytes Absolute: 0.6 10*3/uL (ref 0.1–1.0)
Monocytes Absolute: 0.9 10*3/uL (ref 0.1–1.0)
Monocytes Relative: 4 % (ref 3–12)
Monocytes Relative: 6 % (ref 3–12)
Myelocytes: 0 %
Neutro Abs: 11.1 10*3/uL — ABNORMAL HIGH (ref 1.7–7.7)
Neutro Abs: 8.8 10*3/uL — ABNORMAL HIGH (ref 1.7–7.7)
Neutrophils Relative %: 63 % (ref 43–77)
Neutrophils Relative %: 76 % (ref 43–77)
Promyelocytes Absolute: 0 %
nRBC: 0 /100 WBC

## 2010-10-10 LAB — URINE CULTURE: Colony Count: 30000

## 2010-10-10 LAB — WET PREP, GENITAL
Clue Cells Wet Prep HPF POC: NONE SEEN
Trich, Wet Prep: NONE SEEN
Yeast Wet Prep HPF POC: NONE SEEN

## 2010-10-10 LAB — POCT PREGNANCY, URINE: Preg Test, Ur: POSITIVE

## 2010-10-10 LAB — GLUCOSE, CAPILLARY
Glucose-Capillary: 78 mg/dL (ref 70–99)
Glucose-Capillary: 82 mg/dL (ref 70–99)

## 2010-10-10 LAB — GC/CHLAMYDIA PROBE AMP, GENITAL
Chlamydia, DNA Probe: NEGATIVE
GC Probe Amp, Genital: NEGATIVE

## 2010-10-10 LAB — HCG, QUANTITATIVE, PREGNANCY: hCG, Beta Chain, Quant, S: 86378 m[IU]/mL — ABNORMAL HIGH (ref <5)

## 2010-10-10 LAB — ABO/RH: ABO/RH(D): O POS

## 2010-11-08 NOTE — Group Therapy Note (Signed)
NAME:  Nancy [name], Nancy [name] NO.:  [id number]   MEDICAL RECORD NO.:  [id number]          PATIENT TYPE:  WOC   LOCATION:  [facility]                   FACILITY:  [facility]   PHYSICIAN:  Ginger Carne, MD DATE OF BIRTH:  August 23, 1980   DATE OF SERVICE:  04/10/2007                                  CLINIC NOTE   The patient is here today to restart birth control and for yearly exam.  Her principal complaints have been irregular menses minimal occasional  left breast pain and lower back pain, which started after her first  epidural.  Otherwise, she has been in good health.  She is taking no  medications at this time.  Remainder of history per chart,.   PHYSICAL EXAMINATION:  VITAL SIGNS:  Blood pressure 114/73, weight 231  pounds, height 5 feet 1-1/2 inches, pulse 83 and regular.  HEENT:  Grossly normal.  BREASTS EXAM:  Without masses, discharge, thickenings or tenderness.  CHEST:  Clear to percussion and auscultation.  CARDIOVASCULAR:  Without murmurs or enlargements, regular rate and  rhythm.  Extremities, lymphatic, skin, neurological, musculoskeletal system  within normal limits.  ABDOMEN:  Soft, obese without gross hepatosplenomegaly.  PELVIC EXAM:  Normal Pap smear.  EXTERNAL GENITALIA:  Vulva and vagina normal.  Cervix smooth without  erosions or lesions.   IMPRESSION:  Normal yearly exam.   PLAN:  The patient had been NuvaRing in the past, was happy with this  form of birth control and was prescribed for 1 year.  I discussed her  back pain.  I think it has much to due with her weight as possibly any  residual effects of her epidurals in the past.  I suggested that she  consider being followed by Orthopedics to see if physical therapy and/or  an MRI be useful.  I do not think that this is something that should be  handled through our clinic.           ______________________________  Ginger Carne, MD     SHB/MEDQ  D:  04/10/2007  T:  04/11/2007  Job:   [id number]

## 2010-11-11 NOTE — Op Note (Signed)
NAME:  Nancy [name], Nancy [name] NO.:  [id number]   MEDICAL RECORD NO.:  [id number]          PATIENT TYPE:  INP   LOCATION:  9126                          FACILITY:  [facility]   PHYSICIAN:  Lesly Dukes, M.D. DATE OF BIRTH:  10/29/1980   DATE OF PROCEDURE:  07/06/2005  DATE OF DISCHARGE:                                 OPERATIVE REPORT   PRIMARY CARE PHYSICIAN:  Dwana Curd. Para March, M.D.   PREOPERATIVE DIAGNOSES:  1.  Forty week two day intrauterine pregnancy.  2.  Nonreactive stress test with tachycardic and placental abruption on      ultrasound.  3.  Gestational diabetes.   POSTOPERATIVE DIAGNOSES:  1.  Forty week two day intrauterine pregnancy.  2.  Nonreactive stress test with tachycardic and placental abruption on      ultrasound.  3.  Gestational diabetes.   PROCEDURE:  Primary low transverse cesarean section via Pfannenstiel.   SURGEON:  Lesly Dukes, M.D.   ASSISTANT:  Marc Morgans. Mayford Knife, M.D.   ANESTHESIA:  Epidural.   COMPLICATIONS:  None.   ESTIMATED BLOOD LOSS:  2000 mL.   IV FLUIDS:  4000 mL.   URINE OUTPUT:  300 mL of clear urine.   INDICATIONS:  The patient is a 30 year old G2, P61, at 81 and 2 weeks'  gestation, whom we have been monitoring for gestational diabetes when her  nonstress test showed fetal tachycardia.  Ultrasound then revealed suspected  placental abruption.  The patient was counseled on the risks and benefits  and wanted to proceed with cesarean section.  The patient was emergently  sectioned.   FINDINGS:  A female infant in the cephalic presentation.  NICU present at  delivery.  Apgars 8 and 9.  Arterial pH 7.33.  Normal uterus, tubes, and  ovaries.  Placental abruption was very obvious upon incision into the  uterus.   PROCEDURE:  The patient was taken to the operating room, where epidural  anesthesia was placed and found to be adequate.  She was then rapidly  prepped and draped in the normal sterile fashion in the dorsal  supine  position with a leftward tilt.  A Pfannenstiel skin incision was then made  with a scalpel and carried through to the underlying layer of fascia.  The  fascia was nicked in the midline and the incision extended laterally with  Mayo scissors.  The superior aspect of the fascial incision was then grasped  with Kocher clamps, elevated, and the underlying rectus muscles dissected  off bluntly.  Attention was then turned to the inferior aspect of the  incision which, in a similar fashion, was grasped, tented up with Kocher  clamps, and the rectus muscles dissected off bluntly.  The rectus muscles  were then separated in the midline and the peritoneum was entered bluntly  with good visualization of the bladder.  The bladder blade was inserted and  the vesicouterine peritoneum identified, grasped with pick-ups and entered  sharply with the Metzenbaum scissors.  Incision was extended laterally and  the bladder flap created digitally.   The bladder blade was then reinserted  and the lower uterine segment incised  in a transverse fashion with the scalpel.  The uterine incision was extended  laterally manually.  As soon as the uterine incision was made, a very large  clot of blood came out of the incision and there was significant bleeding.  The infant's head and body was delivered atraumatically very quickly.  The  nose and mouth were bulb-suctioned and the cord clamped and cut.  The infant  was carried off to the awaiting neonatologist.  Cord gases were sent.  The  placenta was removed manually, the uterus exteriorized and cleared of all  clots and debris.  The placenta was sent.  The uterine incision was repaired  with 0 Vicryl in a running locked fashion.  There were two areas of bleeding  that were reinforced with figure-of-eights using the same suture.  Finally  excellent hemostasis was obtained.  The uterus was replaced and the gutters  irrigated.   Prior to the fascia being closed,  there was some bleeding coming from the  fascial edge.  Finally, the fascia was reapproximated with 0 Vicryl in a  running fashion.  The skin was closed with staples.  The patient tolerated  the procedure well.  Sponge, lap and needle counts were correct x2.     ______________________________  Marc Morgans. Mayford Knife, M.D.    ______________________________  Lesly Dukes, M.D.    TLW/MEDQ  D:  07/06/2005  T:  07/07/2005  Job:  [id number]   cc:   Dwana Curd. Para March, M.D.  Fax: [phone]

## 2010-11-11 NOTE — Group Therapy Note (Signed)
NAME:  Nancy Nancy [name], Nancy [name] NO.:  [id number]   MEDICAL RECORD NO.:  [id number]          PATIENT TYPE:  WOC   LOCATION:  [facility]                   FACILITY:  [facility]   PHYSICIAN:  Tinnie Gens, MD        DATE OF BIRTH:  [birth year]   DATE OF SERVICE:  10/13/2004                                    CLINIC NOTE   CHIEF COMPLAINT:  Pap smear.   HISTORY OF PRESENT ILLNESS:  The patient is a 30 year old gravida 1 para 0  who is on the NuvaRing for abnormal uterine bleeding followed by anemia. She  is here today for her annual exam Pap smear. She is also complaining of some  dizziness that she has felt with room spinning for the last 2 weeks. She is  worried that she might be anemic. She also would like a blood test for  diabetes as she has a strong family history of this.   PAST MEDICAL HISTORY:  Significant for panic attacks, anxiety, and anemia.   PAST SURGICAL HISTORY:  Negative.   FAMILY HISTORY:  Significant for diabetes, heart disease.   SOCIAL HISTORY:  No tobacco, alcohol, or drug use.   ALLERGIES:  PENICILLIN.   MEDICATIONS:  NuvaRing only.   GYNECOLOGICAL HISTORY:  Menarche at 30 years of age. Regular cycles now with  the NuvaRing. Scant amount of periods.   OBSTETRICAL HISTORY:  G1 P0. Last pregnancy was approximately 5 years ago.   REVIEW OF SYSTEMS:  A 14-point review of systems reviewed and is negative  except for in the HPI.   PHYSICAL EXAMINATION TODAY:  VITAL SIGNS:  As noted in the chart.  GENERAL:  She is a moderately obese Hispanic female in no acute distress.  ABDOMEN:  Soft, nontender, nondistended.  GENITOURINARY:  Normal external female genitalia. The vagina is pink and  rugated. Cervix is visualized. It is nulliparous and without lesion. The  uterus is somewhat enlarged, approximately 8 weeks size. The adnexa were  without mass or tenderness.   IMPRESSION:  1.  Gynecologic examination with Pap.  2.  History of anemia.  3.  Dizziness,  likely vertigo.  4.  Family history of diabetes.   PLAN:  1.  Pap smear today.  2.  CBC.  3.  BMP to check blood glucose.  4.  Refill NuvaRing x1 year.  5.  Trial of meclizine for dizziness.      TP/MEDQ  D:  10/13/2004  T:  10/13/2004  Job:  [id number]

## 2010-11-11 NOTE — Group Therapy Note (Signed)
NAME:  Nancy [name], ATKIN NO.:  [id number]   MEDICAL RECORD NO.:  [id number]          PATIENT TYPE:  WOC   LOCATION:  [facility]                   FACILITY:  [facility]   PHYSICIAN:  Tinnie Gens, MD        DATE OF BIRTH:  01/01/1981   DATE OF SERVICE:  06/24/2004                                    CLINIC NOTE   CHIEF COMPLAINT:  Follow-up.   HISTORY OF PRESENT ILLNESS:  The patient is a 30 year old gravida 1 para 0  who has been seen in this clinic twice for workup of abnormal uterine  bleeding.  The patient's workup thus far has included a normal TSH, profound  anemia with hemoglobin of 8.7, and ferritin that was very low consistent  with iron deficiency.  The patient also had a pelvic sonogram which showed a  thickened endometrium of 16 mm but otherwise normal pelvic sonogram.  The  patient has been using NuvaRing and has had marked decrease in her blood  loss as well as her dizziness and fatigue.   PHYSICAL EXAMINATION TODAY:  VITAL SIGNS:  As noted in the chart.  GENERAL:  She is a well-developed, well-nourished white female in no acute  distress.  Her skin is still pale.   IMPRESSION:  1.  Dysfunctional uterine bleeding.  2.  Thickened endometrial stripe.  3.  Iron deficiency anemia.   PLAN:  1.  Will recheck CBC today.  2.  Follow-up sonohysterogram to delineate if there is a polyp or if the      endometrium is still so thick after treatment with NuvaRing.  If it is,      the patient probably should undergo endometrial biopsy.  3.  Will continue NuvaRing for now.  4.  Follow up after her sonogram.      TP/MEDQ  D:  06/24/2004  T:  06/24/2004  Job:  [id number]

## 2010-11-11 NOTE — Discharge Summary (Signed)
[facility]  Patient:    Nancy [name], Nancy [name]               MRN: [id number] Adm. Date:  [id number] Disc. Date: [id number] Attending:  Tammi Sou                           Discharge Summary  ADMITTING DIAGNOSES:          Pyelonephritis.  DISCHARGE DIAGNOSES:          Pyelonephritis, improved after intravenous antibiotic therapy, discharged home in good condition.  REASON FOR ADMISSION:         A 30 year old G1, P1 with last menstrual period unknown presented with a complaint of generalized abdominal pain, nausea, and vomiting for one day.  She denied dysuria, frequency, or urgency.  She had previous delivery in November 2001 and had not had a period since then.  PAST SURGICAL HISTORY:        None.  PAST MEDICAL HISTORY:         None.  MEDICATIONS:                  Naprosyn.  ALLERGIES:                    PENICILLIN causes hives.  SOCIAL HISTORY:               Does not speak [place].  Married.  Denies alcohol, tobacco, or recreational drug use.  PHYSICAL EXAMINATION  GENERAL:                      Well-nourished, well-developed female in no acute distress.  VITAL SIGNS:                  Temperature 99.2, pulse 82, respiratory rate 18, blood pressure 105/70.  LUNGS:                        Clear to auscultation bilaterally.  CARDIOVASCULAR:               Regular rate and rhythm without murmurs.  ABDOMEN:                      Soft, tender diffusely, but significant right lower quadrant suprapubic tenderness.  PELVIC:                       Uterus:  Slightly tender, but limited examination because of patients obesity.  There was marked suprapubic tenderness.  There are no adnexal masses or tenderness appreciated.  LABORATORIES:                 Urine pregnancy test was negative.  Urinalysis reveals specific gravity 1.025, trace leukocyte esterase, few bacteria, 21-50 white blood cells, rare epithelial cell.  Wet prep revealed  moderate white blood cells and few bacteria.  CBC revealed white blood cell count 20,000, hemoglobin 12, platelets 333,000.  GC and chlamydia cultures were done.  HOSPITAL COURSE:              The patient was admitted for IV antibiotic therapy and responded well to IV antibiotic therapy.  She was discharged home on hospital day #2 in good condition to continue p.o. antibiotic therapy for another 11 days.  DISCHARGE LABORATORIES:       Urine culture revealed 75,000 colonies of E. coli sensitive  to antibiotics that were given.  GC and chlamydia cultures were negative.  Renal ultrasound was within normal limits.  Pelvic ultrasound was within normal limits.  DISCHARGE MEDICATIONS:        Ciprofloxacin 500 mg b.i.d. for 11 days, Motrin or Tylenol p.r.n. for pain.  DISCHARGE INSTRUCTIONS:       Regular diet.  Push p.o. fluids.  Activity as tolerated.  Patient is to follow-up at the [facility] in two weeks for a repeat urinalysis. DD:  01/18/01 TD:  01/19/01 Job: [id number]

## 2010-11-11 NOTE — Discharge Summary (Signed)
NAME:  Nancy [name], Nancy [name] NO.:  [id number]   MEDICAL RECORD NO.:  [id number]          PATIENT TYPE:  INP   LOCATION:                                [facility]   PHYSICIAN:  Lesly Dukes, M.D. DATE OF BIRTH:  1980/08/03   DATE OF ADMISSION:  07/06/2005  DATE OF DISCHARGE:  07/09/2005                                 DISCHARGE SUMMARY   DISCHARGE DIAGNOSIS:  Status post C-section for abruption at 37 weeks and 5  days, with stable infant.   DISCHARGE MEDICATIONS:  1.  Percocet 5/325 mg on tab p.o. q.4 h. p.r.n. pain.  2.  Motrin 20 mg, 3 tabs p.o. 4 times a day p.r.n. pain.  3.  Colace one p.o. q.d. p.r.n. any constipation.  4.  Prenatal vitamins one p.o. q.d.   FOLLOWUP:  The patient is to call [facility] on July 10, 2005, and set up a 6-week followup appointment.  She is to come back on  July 11, 2005 for staple removal at the [facility].  She also knows to schedule  a followup appointment for the baby with me.   PROCEDURES:  See dictated operative note.   HOSPITAL COURSE:  The patient is a pleasant 30 year old Hispanic female with  a history of macrosomia with her first pregnancy, who also had hyperglycemia  and macrosomia during this gestation.  She presented to [facility] on  July 06, 2005 with vaginal bleeding.  She was evaluated and quickly moved  to the operating room for a C-section secondary to abruption.  Female infant  was born with Apgars of 8 at 1 minute and 9 at 5 minutes.  The baby was  transferred to the nursery and had a stable course in the hospital.   The patient had a stable postoperative course.  Her hemoglobin was 9.9.  Her  pain was controlled.  She was able to pass gas, make urine, and walk without  difficulty.  Her wound was healing well.  She remained afebrile and was  appropriate for discharge.   The patient was O+, antibody negative, rubella immune, RPR nonreactive.  She  is breast and bottle-feeding.   She is using condoms for birth control.  She  did not desire circumcision for the baby.  She was instructed to return 2  days  after discharge, for staple removal.  I also instructed her to check fasting  blood sugars for the next several days.  She is not to continue the  Glyburide that she was on previously.   DISCHARGE LABORATORY:  As discussed above.      Dwana Curd Para March, M.D.    ______________________________  Lesly Dukes, M.D.    GSD/MEDQ  D:  07/09/2005  T:  07/10/2005  Job:  [id number]   cc:   Dwana Curd. Para March, M.D.  Fax: [phone]

## 2010-11-11 NOTE — Group Therapy Note (Signed)
NAME:  Nancy [name], COMES NO.:  [id number]   MEDICAL RECORD NO.:  [id number]          PATIENT TYPE:  WOC   LOCATION:  [facility]                   FACILITY:  [facility]   PHYSICIAN:  Tinnie Gens, MD        DATE OF BIRTH:  1980-08-15   DATE OF SERVICE:  05/05/2004                                    CLINIC NOTE   CHIEF COMPLAINT:  Follow-up.   HISTORY OF PRESENT ILLNESS:  The patient is a 30 year old gravida 1 para 0  who was seen on April 14, 2004 by Dr. Toney Rakes.  At that time she had  dysfunctional uterine bleeding and a history of weight loss.  A CBC was  ordered and she was given Provera.  The patient returns today with decreased  bleeding with Provera.  However, her anemia which was markedly profound,  needs to be worked up.  At this point, we will schedule her for a pelvic  ultrasound.   PHYSICAL EXAMINATION:  Her vital signs are as noted in the chart.  She is a  well-developed, well-nourished female in no acute distress.  Her abdomen is  soft, nontender, nondistended.   IMPRESSION:  1.  Dysfunctional uterine bleeding.  2.  Anemia.  3.  Obesity.   PLAN:  Check pelvic ultrasound and anemia workup.      TP/MEDQ  D:  05/13/2004  T:  05/13/2004  Job:  [id number]

## 2011-04-05 LAB — POCT PREGNANCY, URINE
Operator id: 134861
Preg Test, Ur: NEGATIVE

## 2012-02-07 ENCOUNTER — Encounter (HOSPITAL_COMMUNITY): Payer: Self-pay | Admitting: Emergency Medicine

## 2012-02-07 ENCOUNTER — Emergency Department (HOSPITAL_COMMUNITY): Payer: Self-pay

## 2012-02-07 ENCOUNTER — Emergency Department (HOSPITAL_COMMUNITY)
Admission: EM | Admit: 2012-02-07 | Discharge: 2012-02-08 | Disposition: A | Discharge status: Home / Self Care | Payer: Self-pay | Attending: Emergency Medicine | Admitting: Emergency Medicine

## 2012-02-07 DIAGNOSIS — K8020 Calculus of gallbladder without cholecystitis without obstruction: Secondary | ICD-10-CM | POA: Insufficient documentation

## 2012-02-07 DIAGNOSIS — R109 Unspecified abdominal pain: Secondary | ICD-10-CM | POA: Insufficient documentation

## 2012-02-07 DIAGNOSIS — N390 Urinary tract infection, site not specified: Secondary | ICD-10-CM | POA: Insufficient documentation

## 2012-02-07 DIAGNOSIS — R10819 Abdominal tenderness, unspecified site: Secondary | ICD-10-CM | POA: Insufficient documentation

## 2012-02-07 LAB — URINE MICROSCOPIC-ADD ON

## 2012-02-07 LAB — URINALYSIS, ROUTINE W REFLEX MICROSCOPIC
Bilirubin Urine: NEGATIVE
Glucose, UA: NEGATIVE mg/dL
Hgb urine dipstick: NEGATIVE
Ketones, ur: NEGATIVE mg/dL
Nitrite: NEGATIVE
Protein, ur: NEGATIVE mg/dL
Specific Gravity, Urine: 1.015 (ref 1.005–1.030)
Urobilinogen, UA: 0.2 mg/dL (ref 0.0–1.0)
pH: 6 (ref 5.0–8.0)

## 2012-02-07 LAB — CBC WITH DIFFERENTIAL/PLATELET
Basophils Absolute: 0 10*3/uL (ref 0.0–0.1)
Basophils Relative: 0 % (ref 0–1)
Eosinophils Absolute: 0.2 10*3/uL (ref 0.0–0.7)
Eosinophils Relative: 1 % (ref 0–5)
HCT: 23.8 % — ABNORMAL LOW (ref 36.0–46.0)
Hemoglobin: 7.1 g/dL — ABNORMAL LOW (ref 12.0–15.0)
Lymphocytes Relative: 17 % (ref 12–46)
Lymphs Abs: 3.1 10*3/uL (ref 0.7–4.0)
MCH: 18.4 pg — ABNORMAL LOW (ref 26.0–34.0)
MCHC: 29.8 g/dL — ABNORMAL LOW (ref 30.0–36.0)
MCV: 61.8 fL — ABNORMAL LOW (ref 78.0–100.0)
Monocytes Absolute: 0.9 10*3/uL (ref 0.1–1.0)
Monocytes Relative: 5 % (ref 3–12)
Neutro Abs: 14 10*3/uL — ABNORMAL HIGH (ref 1.7–7.7)
Neutrophils Relative %: 77 % (ref 43–77)
Platelets: 434 10*3/uL — ABNORMAL HIGH (ref 150–400)
RBC: 3.85 MIL/uL — ABNORMAL LOW (ref 3.87–5.11)
RDW: 18.7 % — ABNORMAL HIGH (ref 11.5–15.5)
WBC: 18.2 10*3/uL — ABNORMAL HIGH (ref 4.0–10.5)

## 2012-02-07 LAB — COMPREHENSIVE METABOLIC PANEL
ALT: 38 U/L — ABNORMAL HIGH (ref 0–35)
AST: 74 U/L — ABNORMAL HIGH (ref 0–37)
Albumin: 3.6 g/dL (ref 3.5–5.2)
Alkaline Phosphatase: 145 U/L — ABNORMAL HIGH (ref 39–117)
BUN: 15 mg/dL (ref 6–23)
CO2: 23 mEq/L (ref 19–32)
Calcium: 8.8 mg/dL (ref 8.4–10.5)
Chloride: 98 mEq/L (ref 96–112)
Creatinine, Ser: 0.53 mg/dL (ref 0.50–1.10)
GFR calc Af Amer: >90 mL/min (ref >90)
GFR calc non Af Amer: >90 mL/min (ref >90)
Glucose, Bld: 105 mg/dL — ABNORMAL HIGH (ref 70–99)
Potassium: 3.5 mEq/L (ref 3.5–5.1)
Sodium: 132 mEq/L — ABNORMAL LOW (ref 135–145)
Total Bilirubin: 0.4 mg/dL (ref 0.3–1.2)
Total Protein: 8 g/dL (ref 6.0–8.3)

## 2012-02-07 LAB — POCT PREGNANCY, URINE: Preg Test, Ur: NEGATIVE

## 2012-02-07 LAB — LIPASE, BLOOD: Lipase: 31 U/L (ref 11–59)

## 2012-02-07 MED ORDER — MORPHINE SULFATE 4 MG/ML IJ SOLN
4.0000 mg | Freq: Once | INTRAMUSCULAR | Status: AC
  Administered 2012-02-07: 4 mg via INTRAVENOUS
  Filled 2012-02-07: qty 1

## 2012-02-07 MED ORDER — SODIUM CHLORIDE 0.9 % IV BOLUS (SEPSIS)
1000.0000 mL | Freq: Once | INTRAVENOUS | Status: AC
  Administered 2012-02-07: 1000 mL via INTRAVENOUS

## 2012-02-07 MED ORDER — NITROFURANTOIN MONOHYD MACRO 100 MG PO CAPS: 100.0000 mg | ORAL_CAPSULE | Freq: Two times a day (BID) | ORAL | Status: AC

## 2012-02-07 MED ORDER — ONDANSETRON HCL 4 MG/2ML IJ SOLN
4.0000 mg | Freq: Once | INTRAMUSCULAR | Status: AC
  Administered 2012-02-07: 4 mg via INTRAVENOUS
  Filled 2012-02-07: qty 2

## 2012-02-07 NOTE — ED Notes (Signed)
Pt at ultrasound

## 2012-02-07 NOTE — ED Notes (Signed)
[facility] report, pt from home: C/o of RUQ pain.  Riding in the car and had a sudden onset of abd pain.  Started almost 30 minutes ago.  Last menstrual pd 2 weeks ago.  Last thing she had to eat was eggs at 14:00.  Denies N/V, vaginal discharge or bleeding.  Describes pain as a "sharp, cramping feeling."  Still has appendix and gallbladder. BP: 154/98, HR:90, RR:24, 99% RA

## 2012-02-07 NOTE — ED Notes (Signed)
Peter, PA at bedside.

## 2012-02-07 NOTE — ED Notes (Signed)
ZOX:[id number]<VW> Expected date:<BR> Expected time:<BR> Means of arrival:<BR> Comments:<BR> EMS/flank pain

## 2012-02-07 NOTE — ED Provider Notes (Signed)
History     CSN: [id number]  Arrival date & time 02/07/12  4098   First MD Initiated Contact with Patient 02/07/12 2058      Chief Complaint  Patient presents with  . Abdominal Pain   HPI  History provided by the patient. Patient is a 31 year old Hispanic female who presents with complaints of acute onset epigastric and right upper quadrant abdominal pains. Pain began around 6 PM while the patient was driving. She reports having sharp stabbing pains to the upper abdomen area. Pain was initially associated with cold sweat and patient states that her breath was taken from her. Since that time pain has been waxing and waning between a 7/10 pain to a 10 out of 10 pain. Patient is not feel she could drive safely and called EMS. She has not had any intervention for her pain symptoms. Patient does report having some similar pains to the upper abdomen over the past several weeks but this usually resolves on its own in a short matter of time. She denies any associated fever, nausea, vomiting, diarrhea or constipation. She denies having any lower abdominal or pelvic discomfort. She denies any dysuria, hematuria, urinary frequency or flank pain. Patient's last meal was around 2 PM.   History reviewed. No pertinent past medical history.  History reviewed. No pertinent past surgical history.  No family history on file.  History  Substance Use Topics  . Smoking status: Not on file  . Smokeless tobacco: Not on file  . Alcohol Use: Not on file    OB History    Grav Para Term Preterm Abortions TAB SAB Ect Mult Living                  Review of Systems  Constitutional: Positive for chills and diaphoresis. Negative for fever.  Respiratory: Negative for cough and shortness of breath.   Cardiovascular: Negative for chest pain.  Gastrointestinal: Positive for abdominal pain. Negative for nausea, vomiting, diarrhea and constipation.  Genitourinary: Negative for dysuria, frequency, hematuria, flank  pain, vaginal bleeding and vaginal discharge.    Allergies  Penicillins  Home Medications  No current outpatient prescriptions on file.  BP 106/55  Pulse 86  Temp 98.7 F (37.1 C) (Oral)  Resp 20  SpO2 100%  LMP 01/24/2012  Physical Exam  Nursing note and vitals reviewed. Constitutional: She is oriented to person, place, and time. She appears well-developed and well-nourished. No distress.  HENT:  Head: Normocephalic.  Cardiovascular: Normal rate and regular rhythm.   No murmur heard. Pulmonary/Chest: Effort normal and breath sounds normal. No respiratory distress. She has no wheezes. She has no rales.  Abdominal: Soft. She exhibits no mass. There is no hepatosplenomegaly. There is tenderness in the right upper quadrant and epigastric area. There is no rigidity, no rebound, no guarding, no CVA tenderness, no tenderness at McBurney's point and negative Murphy's sign.       Patient is obese  Neurological: She is alert and oriented to person, place, and time.  Skin: Skin is warm and dry. No rash noted.  Psychiatric: She has a normal mood and affect. Her behavior is normal.    ED Course  Procedures    Results for orders placed during the hospital encounter of 02/07/12  CBC WITH DIFFERENTIAL      Component Value Range   WBC 18.2 (*) 4.0 - 10.5 K/uL   RBC 3.85 (*) 3.87 - 5.11 MIL/uL   Hemoglobin 7.1 (*) 12.0 - 15.0 g/dL  HCT 23.8 (*) 36.0 - 46.0 %   MCV 61.8 (*) 78.0 - 100.0 fL   MCH 18.4 (*) 26.0 - 34.0 pg   MCHC 29.8 (*) 30.0 - 36.0 g/dL   RDW 40.9 (*) 81.1 - 91.4 %   Platelets 434 (*) 150 - 400 K/uL   Neutrophils Relative 77  43 - 77 %   Lymphocytes Relative 17  12 - 46 %   Monocytes Relative 5  3 - 12 %   Eosinophils Relative 1  0 - 5 %   Basophils Relative 0  0 - 1 %   Neutro Abs 14.0 (*) 1.7 - 7.7 K/uL   Lymphs Abs 3.1  0.7 - 4.0 K/uL   Monocytes Absolute 0.9  0.1 - 1.0 K/uL   Eosinophils Absolute 0.2  0.0 - 0.7 K/uL   Basophils Absolute 0.0  0.0 - 0.1 K/uL     RBC Morphology STOMATOCYTES    COMPREHENSIVE METABOLIC PANEL      Component Value Range   Sodium 132 (*) 135 - 145 mEq/L   Potassium 3.5  3.5 - 5.1 mEq/L   Chloride 98  96 - 112 mEq/L   CO2 23  19 - 32 mEq/L   Glucose, Bld 105 (*) 70 - 99 mg/dL   BUN 15  6 - 23 mg/dL   Creatinine, Ser 7.82  0.50 - 1.10 mg/dL   Calcium 8.8  8.4 - 95.6 mg/dL   Total Protein 8.0  6.0 - 8.3 g/dL   Albumin 3.6  3.5 - 5.2 g/dL   AST 74 (*) 0 - 37 U/L   ALT 38 (*) 0 - 35 U/L   Alkaline Phosphatase 145 (*) 39 - 117 U/L   Total Bilirubin 0.4  0.3 - 1.2 mg/dL   GFR calc non Af Amer >90  >90 mL/min   GFR calc Af Amer >90  >90 mL/min  LIPASE, BLOOD      Component Value Range   Lipase 31  11 - 59 U/L  URINALYSIS, ROUTINE W REFLEX MICROSCOPIC      Component Value Range   Color, Urine YELLOW  YELLOW   APPearance CLOUDY (*) CLEAR   Specific Gravity, Urine 1.015  1.005 - 1.030   pH 6.0  5.0 - 8.0   Glucose, UA NEGATIVE  NEGATIVE mg/dL   Hgb urine dipstick NEGATIVE  NEGATIVE   Bilirubin Urine NEGATIVE  NEGATIVE   Ketones, ur NEGATIVE  NEGATIVE mg/dL   Protein, ur NEGATIVE  NEGATIVE mg/dL   Urobilinogen, UA 0.2  0.0 - 1.0 mg/dL   Nitrite NEGATIVE  NEGATIVE   Leukocytes, UA MODERATE (*) NEGATIVE  POCT PREGNANCY, URINE      Component Value Range   Preg Test, Ur NEGATIVE  NEGATIVE  URINE MICROSCOPIC-ADD ON      Component Value Range   Squamous Epithelial / LPF MANY (*) RARE   WBC, UA 7-10  <3 WBC/hpf   RBC / HPF 0-2  <3 RBC/hpf   Bacteria, UA MANY (*) RARE   Urine-Other MUCOUS PRESENT        US Abdomen Complete  02/07/2012  *RADIOLOGY REPORT*  Clinical Data:  Abdominal pain  COMPLETE ABDOMINAL ULTRASOUND  Comparison:  None.  Findings:  Gallbladder:  Multiple stones in the lumen of the gallbladder measuring up to 14 mm diameter.  There is no gallbladder wall thickening or pericholecystic fluid.  Common bile duct:  4.2 mm, unremarkable.  Liver:  No focal lesion identified.  Within normal limits in  parenchymal echogenicity.  IVC:  Appears normal.  Pancreas:  Unremarkable, segments obscured by overlying bowel gas.  Spleen:  11.1 cm craniocaudal length, unremarkable.  Right Kidney:  12.5 cm. No hydronephrosis.  Well-preserved cortex. Normal size and parenchymal echotexture without focal abnormalities.  Left Kidney:  12.4 cm. No hydronephrosis.  Well-preserved cortex. Normal size and parenchymal echotexture without focal abnormalities.  Abdominal aorta:  No aneurysm identified.  IMPRESSION:  Cholelithiasis without other ultrasound evidence of cholecystitis or biliary obstruction.  Original Report Authenticated By: Osa Craver, M.D.     1. Cholelithiasis   2. Abdominal pain   3. UTI (lower urinary tract infection)       MDM  9:00 PM patient seen and evaluated. Patient in moderate discomfort. Pain with acute epigastric right upper quadrant pains. Some history of similar pains over the past several weeks but these resolved quickly.   Patient doing much better after medications. She has slight tenderness but states she has significant improvements. Does not complain of pain at this time. Labs do show slight signs for UTI. This was discussed with patient. Will treat UTI with Macrobid. Patient was given general surgery followup for gallstones. She was given strict return precautions.  Angus Seller, [place] 02/08/12 2110

## 2012-10-11 ENCOUNTER — Encounter (HOSPITAL_COMMUNITY): Payer: Self-pay | Admitting: *Deleted

## 2012-10-11 ENCOUNTER — Inpatient Hospital Stay (HOSPITAL_COMMUNITY)
Admission: AD | Admit: 2012-10-11 | Discharge: 2012-10-16 | DRG: 419 | Disposition: A | Discharge status: Home / Self Care | Payer: MEDICAID | Attending: Family Medicine | Admitting: Family Medicine

## 2012-10-11 DIAGNOSIS — K8010 Calculus of gallbladder with chronic cholecystitis without obstruction: Secondary | ICD-10-CM

## 2012-10-11 DIAGNOSIS — N920 Excessive and frequent menstruation with regular cycle: Secondary | ICD-10-CM | POA: Diagnosis present

## 2012-10-11 DIAGNOSIS — Z880 Allergy status to penicillin: Secondary | ICD-10-CM

## 2012-10-11 DIAGNOSIS — D649 Anemia, unspecified: Secondary | ICD-10-CM

## 2012-10-11 DIAGNOSIS — K8020 Calculus of gallbladder without cholecystitis without obstruction: Secondary | ICD-10-CM

## 2012-10-11 DIAGNOSIS — E669 Obesity, unspecified: Secondary | ICD-10-CM

## 2012-10-11 DIAGNOSIS — R1011 Right upper quadrant pain: Secondary | ICD-10-CM

## 2012-10-11 DIAGNOSIS — R7989 Other specified abnormal findings of blood chemistry: Secondary | ICD-10-CM

## 2012-10-11 DIAGNOSIS — Z87442 Personal history of urinary calculi: Secondary | ICD-10-CM

## 2012-10-11 DIAGNOSIS — F410 Panic disorder [episodic paroxysmal anxiety] without agoraphobia: Secondary | ICD-10-CM

## 2012-10-11 DIAGNOSIS — D500 Iron deficiency anemia secondary to blood loss (chronic): Secondary | ICD-10-CM | POA: Diagnosis present

## 2012-10-11 HISTORY — DX: Calculus of gallbladder without cholecystitis without obstruction: K80.20

## 2012-10-11 HISTORY — DX: Anemia, unspecified: D64.9

## 2012-10-11 LAB — IRON AND TIBC
Iron: <10 ug/dL — ABNORMAL LOW (ref 42–135)
UIBC: 368 ug/dL (ref 125–400)

## 2012-10-11 LAB — CBC WITH DIFFERENTIAL/PLATELET
Basophils Absolute: 0 10*3/uL (ref 0.0–0.1)
Basophils Relative: 0 % (ref 0–1)
Eosinophils Absolute: 0 10*3/uL (ref 0.0–0.7)
Eosinophils Relative: 0 % (ref 0–5)
HCT: 20.2 % — ABNORMAL LOW (ref 36.0–46.0)
Hemoglobin: 5.8 g/dL — CL (ref 12.0–15.0)
Lymphocytes Relative: 16 % (ref 12–46)
Lymphs Abs: 1.7 10*3/uL (ref 0.7–4.0)
MCH: 16 pg — ABNORMAL LOW (ref 26.0–34.0)
MCHC: 28.7 g/dL — ABNORMAL LOW (ref 30.0–36.0)
MCV: 55.8 fL — ABNORMAL LOW (ref 78.0–100.0)
Monocytes Absolute: 0.4 10*3/uL (ref 0.1–1.0)
Monocytes Relative: 4 % (ref 3–12)
Neutro Abs: 8.2 10*3/uL — ABNORMAL HIGH (ref 1.7–7.7)
Neutrophils Relative %: 79 % — ABNORMAL HIGH (ref 43–77)
Platelets: 464 10*3/uL — ABNORMAL HIGH (ref 150–400)
RBC: 3.62 MIL/uL — ABNORMAL LOW (ref 3.87–5.11)
RDW: 19.5 % — ABNORMAL HIGH (ref 11.5–15.5)
WBC: 10.3 10*3/uL (ref 4.0–10.5)

## 2012-10-11 LAB — CBC
HCT: 19.6 % — ABNORMAL LOW (ref 36.0–46.0)
Hemoglobin: 5.7 g/dL — CL (ref 12.0–15.0)
MCH: 16.1 pg — ABNORMAL LOW (ref 26.0–34.0)
MCHC: 29.1 g/dL — ABNORMAL LOW (ref 30.0–36.0)
MCV: 55.5 fL — ABNORMAL LOW (ref 78.0–100.0)
Platelets: 475 10*3/uL — ABNORMAL HIGH (ref 150–400)
RBC: 3.53 MIL/uL — ABNORMAL LOW (ref 3.87–5.11)
RDW: 19.6 % — ABNORMAL HIGH (ref 11.5–15.5)
WBC: 10 10*3/uL (ref 4.0–10.5)

## 2012-10-11 LAB — COMPREHENSIVE METABOLIC PANEL
ALT: 102 U/L — ABNORMAL HIGH (ref 0–35)
AST: 201 U/L — ABNORMAL HIGH (ref 0–37)
Albumin: 3.6 g/dL (ref 3.5–5.2)
Alkaline Phosphatase: 143 U/L — ABNORMAL HIGH (ref 39–117)
BUN: 17 mg/dL (ref 6–23)
CO2: 23 mEq/L (ref 19–32)
Calcium: 8.7 mg/dL (ref 8.4–10.5)
Chloride: 104 mEq/L (ref 96–112)
Creatinine, Ser: 0.51 mg/dL (ref 0.50–1.10)
GFR calc Af Amer: >90 mL/min (ref >90)
GFR calc non Af Amer: >90 mL/min (ref >90)
Glucose, Bld: 117 mg/dL — ABNORMAL HIGH (ref 70–99)
Potassium: 3.8 mEq/L (ref 3.5–5.1)
Sodium: 135 mEq/L (ref 135–145)
Total Bilirubin: 0.6 mg/dL (ref 0.3–1.2)
Total Protein: 7.7 g/dL (ref 6.0–8.3)

## 2012-10-11 LAB — FERRITIN: Ferritin: 1 ng/mL — ABNORMAL LOW (ref 10–291)

## 2012-10-11 LAB — FOLATE: Folate: 14.5 ng/mL

## 2012-10-11 LAB — VITAMIN B12: Vitamin B-12: 1091 pg/mL — ABNORMAL HIGH (ref 211–911)

## 2012-10-11 LAB — RETICULOCYTES
RBC.: 3.62 MIL/uL — ABNORMAL LOW (ref 3.87–5.11)
Retic Count, Absolute: 79.6 10*3/uL (ref 19.0–186.0)
Retic Ct Pct: 2.2 % (ref 0.4–3.1)

## 2012-10-11 LAB — LIPASE, BLOOD: Lipase: 33 U/L (ref 11–59)

## 2012-10-11 LAB — ABO/RH: ABO/RH(D): O POS

## 2012-10-11 MED ORDER — SODIUM CHLORIDE 0.9 % IJ SOLN
3.0000 mL | Freq: Two times a day (BID) | INTRAMUSCULAR | Status: DC
  Administered 2012-10-11 – 2012-10-16 (×7): 3 mL via INTRAVENOUS

## 2012-10-11 MED ORDER — ONDANSETRON HCL 4 MG/2ML IJ SOLN
4.0000 mg | Freq: Four times a day (QID) | INTRAMUSCULAR | Status: DC | PRN
  Administered 2012-10-12: 4 mg via INTRAVENOUS
  Filled 2012-10-11 (×2): qty 2

## 2012-10-11 MED ORDER — IBUPROFEN 600 MG PO TABS
600.0000 mg | ORAL_TABLET | Freq: Three times a day (TID) | ORAL | Status: DC | PRN
  Filled 2012-10-11: qty 1

## 2012-10-11 MED ORDER — HYDROMORPHONE HCL PF 1 MG/ML IJ SOLN
1.0000 mg | Freq: Once | INTRAMUSCULAR | Status: DC
  Filled 2012-10-11: qty 1

## 2012-10-11 MED ORDER — ONDANSETRON HCL 4 MG PO TABS: 4.0000 mg | ORAL_TABLET | Freq: Four times a day (QID) | ORAL | Status: DC | PRN

## 2012-10-11 MED ORDER — HYDROMORPHONE HCL PF 1 MG/ML IJ SOLN
1.0000 mg | INTRAMUSCULAR | Status: DC | PRN
  Administered 2012-10-13: 1 mg via INTRAVENOUS
  Filled 2012-10-11: qty 1

## 2012-10-11 MED ORDER — KETOROLAC TROMETHAMINE 30 MG/ML IJ SOLN
30.0000 mg | Freq: Once | INTRAMUSCULAR | Status: AC
  Administered 2012-10-11: 30 mg via INTRAMUSCULAR
  Filled 2012-10-11: qty 1

## 2012-10-11 MED ORDER — OXYCODONE HCL 5 MG PO TABS
5.0000 mg | ORAL_TABLET | Freq: Four times a day (QID) | ORAL | Status: DC | PRN
  Administered 2012-10-12 (×2): 5 mg via ORAL
  Filled 2012-10-11 (×2): qty 1

## 2012-10-11 MED ORDER — SODIUM CHLORIDE 0.9 % IV SOLN
INTRAVENOUS | Status: DC
  Administered 2012-10-11: 75 mL/h via INTRAVENOUS

## 2012-10-11 MED ORDER — ONDANSETRON 4 MG PO TBDP
4.0000 mg | ORAL_TABLET | Freq: Once | ORAL | Status: AC
  Administered 2012-10-11: 4 mg via ORAL
  Filled 2012-10-11: qty 1

## 2012-10-11 NOTE — Consult Note (Signed)
Reason for Consult:Symptomatic cholelithiasis Referring Physician: Dr. Jannifer Franklin  Nancy [name] is an 32 y.o. female.  HPI: I have been asked to see this 32 year old female for right upper quadrant abdominal pain nausea and vomiting. This is her second attack of similar symptoms. Both occurred after fatty meals. She described the pain as sharp and moderate hurting through to the back. She is more comfortable now. Upon admission, she was found to be profoundly anemic and is currently being transfused. Apparently, she is being worked up by gynecologist for severe uterine bleeding during her menstrual cycles.  She also reports constipation. She denies fevers or chills. She does have fatigue and weakness with some dizziness.  Past Medical History  Diagnosis Date  . Gallstones   . Anemia     Past Surgical History  Procedure Laterality Date  . Cesarean section      History reviewed. No pertinent family history.  Social History:  reports that she has never smoked. She has never used smokeless tobacco. She reports that she does not drink alcohol or use illicit drugs.  Allergies:  Allergies  Allergen Reactions  . Penicillins Swelling and Rash    Medications: I have reviewed the patient's current medications.  Results for orders placed during the hospital encounter of 10/11/12 (from the past 48 hour(s))  LIPASE, BLOOD     Status: None   Collection Time    10/11/12  7:55 AM      Result Value Range   Lipase 33  11 - 59 U/L  COMPREHENSIVE METABOLIC PANEL     Status: Abnormal   Collection Time    10/11/12  7:55 AM      Result Value Range   Sodium 135  135 - 145 mEq/L   Potassium 3.8  3.5 - 5.1 mEq/L   Chloride 104  96 - 112 mEq/L   CO2 23  19 - 32 mEq/L   Glucose, Bld 117 (*) 70 - 99 mg/dL   BUN 17  6 - 23 mg/dL   Creatinine, Ser 0.98  0.50 - 1.10 mg/dL   Calcium 8.7  8.4 - 11.9 mg/dL   Total Protein 7.7  6.0 - 8.3 g/dL   Albumin 3.6  3.5 - 5.2 g/dL   AST 147 (*) 0 - 37 U/L   ALT 102 (*) 0 - 35 U/L   Alkaline Phosphatase 143 (*) 39 - 117 U/L   Total Bilirubin 0.6  0.3 - 1.2 mg/dL   GFR calc non Af Amer >90  >90 mL/min   GFR calc Af Amer >90  >90 mL/min   Comment:            The eGFR has been calculated     using the CKD EPI equation.     This calculation has not been     validated in all clinical     situations.     eGFR's persistently     <90 mL/min signify     possible Chronic Kidney Disease.  CBC     Status: Abnormal   Collection Time    10/11/12  7:55 AM      Result Value Range   WBC 10.0  4.0 - 10.5 K/uL   RBC 3.53 (*) 3.87 - 5.11 MIL/uL   Hemoglobin 5.7 (*) 12.0 - 15.0 g/dL   Comment: REPEATED TO VERIFY     CRITICAL RESULT CALLED TO, READ BACK BY AND VERIFIED WITH:     E NEASE AT 0818 ON 04.18.2014 BY  NBROOKS   HCT 19.6 (*) 36.0 - 46.0 %   MCV 55.5 (*) 78.0 - 100.0 fL   MCH 16.1 (*) 26.0 - 34.0 pg   MCHC 29.1 (*) 30.0 - 36.0 g/dL   RDW 16.1 (*) 09.6 - 04.5 %   Platelets 475 (*) 150 - 400 K/uL  ABO/RH     Status: None   Collection Time    10/11/12  8:26 AM      Result Value Range   ABO/RH(D) O POS    CBC WITH DIFFERENTIAL     Status: Abnormal   Collection Time    10/11/12  8:52 AM      Result Value Range   WBC 10.3  4.0 - 10.5 K/uL   RBC 3.62 (*) 3.87 - 5.11 MIL/uL   Hemoglobin 5.8 (*) 12.0 - 15.0 g/dL   Comment: CRITICAL VALUE NOTED.  VALUE IS CONSISTENT WITH PREVIOUSLY REPORTED AND CALLED VALUE.   HCT 20.2 (*) 36.0 - 46.0 %   MCV 55.8 (*) 78.0 - 100.0 fL   MCH 16.0 (*) 26.0 - 34.0 pg   MCHC 28.7 (*) 30.0 - 36.0 g/dL   RDW 40.9 (*) 81.1 - 91.4 %   Platelets 464 (*) 150 - 400 K/uL   Neutrophils Relative 79 (*) 43 - 77 %   Neutro Abs 8.2 (*) 1.7 - 7.7 K/uL   Lymphocytes Relative 16  12 - 46 %   Lymphs Abs 1.7  0.7 - 4.0 K/uL   Monocytes Relative 4  3 - 12 %   Monocytes Absolute 0.4  0.1 - 1.0 K/uL   Eosinophils Relative 0  0 - 5 %   Eosinophils Absolute 0.0  0.0 - 0.7 K/uL   Basophils Relative 0  0 - 1 %   Basophils Absolute  0.0  0.0 - 0.1 K/uL  TYPE AND SCREEN     Status: None   Collection Time    10/11/12  8:52 AM      Result Value Range   ABO/RH(D) O POS     Antibody Screen NEG     Sample Expiration 10/14/2012     Unit Number [id number]     Blood Component Type RBC LR PHER1     Unit division 00     Status of Unit ISSUED     Transfusion Status OK TO TRANSFUSE     Crossmatch Result Compatible     Unit Number [id number]     Blood Component Type RBC LR PHER1     Unit division 00     Status of Unit ALLOCATED     Transfusion Status OK TO TRANSFUSE     Crossmatch Result Compatible    VITAMIN B12     Status: Abnormal   Collection Time    10/11/12  8:52 AM      Result Value Range   Vitamin B-12 1091 (*) 211 - 911 pg/mL  FOLATE     Status: None   Collection Time    10/11/12  8:52 AM      Result Value Range   Folate 14.5     Comment: (NOTE)     Reference Ranges            Deficient:       0.4 - 3.3 ng/mL            Indeterminate:   3.4 - 5.4 ng/mL            Normal:              >  5.4 ng/mL  IRON AND TIBC     Status: Abnormal   Collection Time    10/11/12  8:52 AM      Result Value Range   Iron <10 (*) 42 - 135 ug/dL   TIBC Not calculated due to Iron <10.  250 - 470 ug/dL   Saturation Ratios Not calculated due to Iron <10.  20 - 55 %   UIBC 368  125 - 400 ug/dL  FERRITIN     Status: Abnormal   Collection Time    10/11/12  8:52 AM      Result Value Range   Ferritin 1 (*) 10 - 291 ng/mL  RETICULOCYTES     Status: Abnormal   Collection Time    10/11/12  8:52 AM      Result Value Range   Retic Ct Pct 2.2  0.4 - 3.1 %   RBC. 3.62 (*) 3.87 - 5.11 MIL/uL   Retic Count, Manual 79.6  19.0 - 186.0 K/uL    No results found.  Review of Systems  Constitutional: Positive for malaise/fatigue. Negative for fever and chills.  HENT: Negative.   Eyes: Negative.   Respiratory: Negative.   Cardiovascular: Negative.   Gastrointestinal: Positive for nausea, vomiting, abdominal pain and  constipation.  Genitourinary: Negative.   Musculoskeletal: Negative.   Skin: Negative.   Neurological: Positive for weakness.  Endo/Heme/Allergies: Bruises/bleeds easily.  Psychiatric/Behavioral: Negative.    Blood pressure 115/59, pulse 76, temperature 98.4 F (36.9 C), temperature source Oral, resp. rate 20, height 5\' 8"  (1.727 m), weight 208 lb 1.8 oz (94.4 kg), last menstrual period 10/04/2012, SpO2 100.00%. Physical Exam  Constitutional: She is oriented to person, place, and time. She appears well-developed and well-nourished. No distress.  HENT:  Head: Normocephalic and atraumatic.  Right Ear: External ear normal.  Left Ear: External ear normal.  Nose: Nose normal.  Mouth/Throat: Oropharynx is clear and moist. No oropharyngeal exudate.  Eyes: Conjunctivae are normal. Pupils are equal, round, and reactive to light. Right eye exhibits no discharge. Left eye exhibits no discharge. No scleral icterus.  Neck: Normal range of motion. Neck supple. No tracheal deviation present. No thyromegaly present.  Cardiovascular: Normal rate, regular rhythm, normal heart sounds and intact distal pulses.   No murmur heard. Respiratory: Effort normal and breath sounds normal. No respiratory distress. She has no wheezes. She has no rales.  GI: Soft. Bowel sounds are normal. She exhibits no mass. There is tenderness. There is no rebound and no guarding.  Very mild tenderness in the right upper quadrant with minimal guarding.  Musculoskeletal: Normal range of motion. She exhibits no edema and no tenderness.  Lymphadenopathy:    She has no cervical adenopathy.  Neurological: She is alert and oriented to person, place, and time.  Skin: Skin is warm and dry. No rash noted. She is not diaphoretic. No erythema.  Psychiatric: Her behavior is normal. Judgment normal.    Assessment/Plan: Symptomatic cholelithiasis Symptomatic anemia  She has been admitted to the hospitalist service. She is currently being  transfused packed red blood cells. Pending an improvement in her hemoglobin, we will then proceed with laparoscopic cholecystectomy this admission. If her anemia persists, further workup may be necessary prior to surgery. We will follow her closely with you.  Nancy [name] A 10/11/2012, 5:17 PM

## 2012-10-11 NOTE — ED Notes (Signed)
Per EMS pt coming from home with c/o RUQ abdominal pain x 2 hours; per EMS pt has hx of gallstones and stated this feels the same. EMS reports pt refused IV line and morphine.

## 2012-10-11 NOTE — Progress Notes (Signed)
During [facility] ED 10/10/12 visit CM spoke with pt who confirms self pay [facility] resident with no pcp. CM discussed and provided written information for self pay pcps, importance of pcp for f/u care, www.needymeds.org, discounted pharmacies and other [place] resources such as financial assistance, DSS and  health department Reviewed Health connect number to assist with finding self pay provider close to pt's residence. Reviewed resources for [employer] self pay pcps like [facility], family medicine at [address], [facility] family practice, general medical clinics, [facility] urgent care plus others, [facility] out patient pharmacies and housing Cm provided also a list of [place] crisis assistance programs to help with paying rent and bills (voiced concern of pt and female at bedside) Pt voiced understanding and appreciation of resources provided   Pt presently not on medications at this time but concern for her is cost of medications at d/c CM reviewed EPIC notes and chart review information CM spoke with the pt about [facility] MATCH program ($3 co pay for each Rx through [facility] program and choice of pharmacies) Pt agreed to receive assistance from program    Pt is eligible for [facility] MATCH program, pt informed and encouraged to ask about assistance prior to d/c day on unit

## 2012-10-11 NOTE — ED Notes (Signed)
Pt alert and oriented x4. Respirations even and unlabored. Bilateral rise and fall of chest. Skin warm and dry. In no acute distress. Denies needs.  

## 2012-10-11 NOTE — ED Notes (Signed)
AVW:[id number]<XB> Expected date:10/11/12<BR> Expected time: 6:33 AM<BR> Means of arrival:Ambulance<BR> Comments:<BR> Rt upper quad pain

## 2012-10-11 NOTE — H&P (Signed)
[facility] History and Physical  QUINNLYN HEARNS FAO:[id number] DOB: Oct 09, 1980 DOA: 10/11/2012  Referring physician: Dr.KOHUT PCP: No primary provider on file.  Specialists:   Chief Complaint: Right upper quadrant  HPI: Ladena Jacquez Glab is a 32 y.o. female with past medical history significant for gallstones and menorrhagia with anemia who presents with above complaints. She states that at about 3 AM she was awakened by severe or right upper quadrant pain along with nausea and vomiting. She came to the ED and labwork was done she was found to have elevated LFTs. She reports that she had a prior episode of right upper quadrant pain with nausea and vomiting in the past, and had an ultrasound done August of 2013 which showed gallstones with no evidence of cholecystitis. Also in the ED patient's CBC showed a hemoglobin of 5.7, she admits to menorrhagia and states that about some 7 years or so she did see a gynecologist and was put on a Nuva ring but was unable to afford it and subsequently stopped it and has not followed up with any gynecologist since then, she admits to dizziness and sometimes and easy fatigability. She denies chest pain, melena and no hematochezia.  Review of Systems: The patient denies anorexia, fever, weight loss, vision loss, decreased hearing, hoarseness, chest pain, syncope, dyspnea on exertion, peripheral edema, balance deficits, hemoptysis melena, hematochezia, severe indigestion/heartburn, hematuria, incontinence, genital sores, muscle weakness,transient blindness, difficulty walking, depression, unusual weight change  Past Medical History  Diagnosis Date  . Gallstones   . Anemia    Past Surgical History  Procedure Laterality Date  . Cesarean section     Social History:  reports that she has never smoked. She has never used smokeless tobacco. She reports that she does not drink alcohol or use illicit drugs.  where does patient live--home Can patient  participate in ADLs-yes  Allergies  Allergen Reactions  . Penicillins Swelling and Rash    Family history-her father has diabetes  Prior to Admission medications   Medication Sig Start Date End Date Taking? Authorizing Provider  ibuprofen (ADVIL,MOTRIN) 200 MG tablet Take 600 mg by mouth every 8 (eight) hours as needed for pain.   Yes Historical Provider, MD   Physical Exam: Filed Vitals:   10/11/12 0705 10/11/12 1128 10/11/12 1145 10/11/12 1300  BP: 123/65 104/40 104/47 115/55  Pulse: 98 70  70  Temp: 98.1 F (36.7 C) 98.3 F (36.8 C)  98.2 F (36.8 C)  TempSrc: Oral Oral  Oral  Resp: 18 18  22   Height:    5\' 8"  (1.727 m)  Weight:    94.4 kg (208 lb 1.8 oz)  SpO2: 98% 100%  100%    Constitutional: Vital signs reviewed.  Patient is a well-developed and well-nourished  in no acute distress and cooperative with exam. Alert and oriented x3.  Head: Normocephalic and atraumatic Mouth: no erythema or exudates, MMM Eyes: PERRL, EOMI, conjunctival pallor, No scleral icterus.  Neck: Supple, Trachea midline normal ROM, No JVD, mass, thyromegaly, or carotid bruit present.  Cardiovascular: RRR, S1 normal, S2 normal, no MRG, pulses symmetric and intact bilaterally Pulmonary/Chest: CTAB, no wheezes, rales, or rhonchi Abdominal: Soft. Mild right upper quadrant tenderness present, non-distended, bowel sounds are normal, no masses, organomegaly, or guarding present.  GU: no CVA tenderness Musculoskeletal: No joint deformities, erythema, or stiffness, ROM full and no nontender Hematology: no cervical, inginal, or axillary adenopathy.  Neurological: A&O x3, Strength is normal and symmetric bilaterally, cranial nerve II-XII are  grossly intact, no focal motor deficit, sensory intact to light touch bilaterally.  Skin: Warm, dry and intact. No rash, cyanosis, or clubbing.  Psychiatric: Normal mood and affect. speech and behavior is normal. Judgment and thought content normal. Cognition and memory  are normal.    Labs on Admission:  Basic Metabolic Panel:  Recent Labs Lab 10/11/12 0755  NA 135  K 3.8  CL 104  CO2 23  GLUCOSE 117*  BUN 17  CREATININE 0.51  CALCIUM 8.7   Liver Function Tests:  Recent Labs Lab 10/11/12 0755  AST 201*  ALT 102*  ALKPHOS 143*  BILITOT 0.6  PROT 7.7  ALBUMIN 3.6    Recent Labs Lab 10/11/12 0755  LIPASE 33   No results found for this basename: AMMONIA,  in the last 168 hours CBC:  Recent Labs Lab 10/11/12 0755 10/11/12 0852  WBC 10.0 10.3  NEUTROABS  --  8.2*  HGB 5.7* 5.8*  HCT 19.6* 20.2*  MCV 55.5* 55.8*  PLT 475* 464*   Cardiac Enzymes: No results found for this basename: CKTOTAL, CKMB, CKMBINDEX, TROPONINI,  in the last 168 hours  BNP (last 3 results) No results found for this basename: PROBNP,  in the last 8760 hours CBG: No results found for this basename: GLUCAP,  in the last 168 hours  Radiological Exams on Admission: No results found.    Assessment/Plan Active Problems:   RUQ abdominal pain/cholelithiasis -Continue pain management -Surgery consult-Dr. Magnus Ivan to see for possible cholecystectomy since this is recurrent.   Elevated LFTs -Likely secondary to #1, follow recheck   Microcytic Anemia, chronic blood loss -Likely secondary to menorrhagia, we'll transfuse packed red blood cells follow and recheck -Obtain a complete pelvic ultrasound to evaluate for possible-she will need outpatient followup with GYN -Will need supplemental iron upon discharge.  -Patient will also need to be set up with her PCP upon discharge.     Code Status: full Family Communication: Husband at bedside Disposition Plan: To home when medically stay  Time spent:>30MINS  Kela Millin [facility] Pager [phone]  If 7PM-7AM, please contact night-coverage [email or web address] Password [facility] 10/11/2012, 3:08 PM

## 2012-10-11 NOTE — Progress Notes (Signed)
Nutrition Brief Note  Patient identified on the Malnutrition Screening Tool (MST) Report  Body mass index is 31.65 kg/(m^2). Patient meets criteria for Obesity based on current BMI. Pt reports that she weighed 252 lbs 1 year ago and has intentionally been trying to lose weight. Pt reports that she stopped eating junk food and now 70% of her diet is fruits and vegetables, she doesn't eat red meat, and cooks mostly with olive oil. Recommended that pt get adequate protein daily and take a Multivitamin with minerals daily.   Current diet order is Heart Healthy, patient is consuming approximately 100% of meals at this time. Pt reports having a good appetite and eating 6 small meals daily PTA. Pt has no further questions or concerns at this time.  Labs and medications reviewed.   No nutrition interventions warranted at this time. If nutrition issues arise, please consult RD.   Ian Malkin RD, LDN Inpatient Clinical Dietitian Pager: [phone] After Hours Pager: [phone]

## 2012-10-11 NOTE — ED Notes (Signed)
This RN is aware that the blood is available in blood bank and informed floor RN.  This RN has been unable to start transfusion, due to other Pt care.  Floor RN approved sending Pt up.

## 2012-10-11 NOTE — ED Provider Notes (Addendum)
History    32 year old female with abdominal pain. Right upper quadrant. Symptom onset a few hours prior to arrival. Patient describes the pain is achy intense pressure. Does not radiate. Past history of cholelithiasis and states that her current symptoms feel similar to previous biliary colic. Nausea, but no vomiting. No fevers or chills. No urinary complaints.    CSN: [id number]  Arrival date & time 10/11/12  0704   First MD Initiated Contact with Patient 10/11/12 [phone]      Chief Complaint  Patient presents with  . Abdominal Pain    (Consider location/radiation/quality/duration/timing/severity/associated sxs/prior treatment) HPI  Past Medical History  Diagnosis Date  . Gallstones     Past Surgical History  Procedure Laterality Date  . Cesarean section      History reviewed. No pertinent family history.  History  Substance Use Topics  . Smoking status: Never Smoker   . Smokeless tobacco: Not on file  . Alcohol Use: No    OB History   Grav Para Term Preterm Abortions TAB SAB Ect Mult Living                  Review of Systems  All systems reviewed and negative, other than as noted in HPI.   Allergies  Penicillins  Home Medications  No current outpatient prescriptions on file.  BP 123/65  Pulse 98  Temp(Src) 98.1 F (36.7 C) (Oral)  Resp 18  SpO2 98%  LMP 10/04/2012  Physical Exam  Nursing note and vitals reviewed. Constitutional: She appears well-developed and well-nourished. No distress.  Laying in bed. Mildly uncomfortable appearing.   HENT:  Head: Normocephalic and atraumatic.  Eyes: Conjunctivae are normal. Right eye exhibits no discharge. Left eye exhibits no discharge.  Neck: Neck supple.  Cardiovascular: Normal rate, regular rhythm and normal heart sounds.  Exam reveals no gallop and no friction rub.   No murmur heard. Pulmonary/Chest: Effort normal and breath sounds normal. No respiratory distress.  Abdominal: Soft. She exhibits no  distension. There is tenderness. There is no guarding.  Moderate RUQ tenderness w/o rebound or guarding  Musculoskeletal: She exhibits no edema and no tenderness.  Neurological: She is alert.  Skin: Skin is warm and dry.  Psychiatric: She has a normal mood and affect. Her behavior is normal. Thought content normal.    ED Course  Procedures (including critical care time)  Labs Reviewed  COMPREHENSIVE METABOLIC PANEL - Abnormal; Notable for the following:    Glucose, Bld 117 (*)    AST 201 (*)    ALT 102 (*)    Alkaline Phosphatase 143 (*)    All other components within normal limits  CBC - Abnormal; Notable for the following:    RBC 3.53 (*)    Hemoglobin 5.7 (*)    HCT 19.6 (*)    MCV 55.5 (*)    MCH 16.1 (*)    MCHC 29.1 (*)    RDW 19.6 (*)    Platelets 475 (*)    All other components within normal limits  CBC WITH DIFFERENTIAL - Abnormal; Notable for the following:    RBC 3.62 (*)    Hemoglobin 5.8 (*)    HCT 20.2 (*)    MCV 55.8 (*)    MCH 16.0 (*)    MCHC 28.7 (*)    RDW 19.5 (*)    Platelets 464 (*)    Neutrophils Relative 79 (*)    Neutro Abs 8.2 (*)    All other components within normal  limits  VITAMIN B12 - Abnormal; Notable for the following:    Vitamin B-12 1091 (*)    All other components within normal limits  IRON AND TIBC - Abnormal; Notable for the following:    Iron <10 (*)    All other components within normal limits  FERRITIN - Abnormal; Notable for the following:    Ferritin 1 (*)    All other components within normal limits  RETICULOCYTES - Abnormal; Notable for the following:    RBC. 3.62 (*)    All other components within normal limits  COMPREHENSIVE METABOLIC PANEL - Abnormal; Notable for the following:    Albumin 3.1 (*)    AST 81 (*)    ALT 115 (*)    Alkaline Phosphatase 118 (*)    All other components within normal limits  CBC - Abnormal; Notable for the following:    RBC 3.74 (*)    Hemoglobin 7.0 (*)    HCT 22.6 (*)    MCV 60.4  (*)    MCH 18.7 (*)    RDW 24.1 (*)    All other components within normal limits  LIPASE, BLOOD  FOLATE  HEMOGLOBIN AND HEMATOCRIT, BLOOD  TYPE AND SCREEN  ABO/RH  PREPARE RBC (CROSSMATCH)  PREPARE RBC (CROSSMATCH)   No results found.   1. Anemia   2. RUQ abdominal pain   3. Cholelithiasis   4. Elevated LFTs       MDM    8:26 AM H/H 5.7/19. Pt with no diagnosed hx of anemia and HD stable. Per review of records pt did have hemoglobin of 7.1 in November. Will repeat to verify.    9:45 AM Anemia confirmed. In speaking with pt more, most likely from dysfunctional uterine bleeding. Pt with hx of irregular and prolonged periods. Previously using NuvaRing for same with what sounds like good results but took her self off of it and hasn't used in years. Previously on iron supplementation as well but not recently. On further ROS she does endorse dizziness, particularly with change of position. Also fatigue and occasional palpitations. Symptoms most likely from anemia although probably fairly chronic in nature.   Raeford Razor, MD 10/12/12 1645  Raeford Razor, MD 10/12/12 [phone]

## 2012-10-11 NOTE — Progress Notes (Signed)
[id number] Lihanna Biever,RN,BSn,CCM: Chart reviewed for admission.

## 2012-10-12 ENCOUNTER — Inpatient Hospital Stay (HOSPITAL_COMMUNITY): Payer: Self-pay

## 2012-10-12 LAB — COMPREHENSIVE METABOLIC PANEL
ALT: 115 U/L — ABNORMAL HIGH (ref 0–35)
AST: 81 U/L — ABNORMAL HIGH (ref 0–37)
Albumin: 3.1 g/dL — ABNORMAL LOW (ref 3.5–5.2)
Alkaline Phosphatase: 118 U/L — ABNORMAL HIGH (ref 39–117)
BUN: 11 mg/dL (ref 6–23)
CO2: 25 mEq/L (ref 19–32)
Calcium: 8.5 mg/dL (ref 8.4–10.5)
Chloride: 105 mEq/L (ref 96–112)
Creatinine, Ser: 0.58 mg/dL (ref 0.50–1.10)
GFR calc Af Amer: >90 mL/min (ref >90)
GFR calc non Af Amer: >90 mL/min (ref >90)
Glucose, Bld: 90 mg/dL (ref 70–99)
Potassium: 3.9 mEq/L (ref 3.5–5.1)
Sodium: 135 mEq/L (ref 135–145)
Total Bilirubin: 0.8 mg/dL (ref 0.3–1.2)
Total Protein: 6.6 g/dL (ref 6.0–8.3)

## 2012-10-12 LAB — CBC
HCT: 22.6 % — ABNORMAL LOW (ref 36.0–46.0)
Hemoglobin: 7 g/dL — ABNORMAL LOW (ref 12.0–15.0)
MCH: 18.7 pg — ABNORMAL LOW (ref 26.0–34.0)
MCHC: 31 g/dL (ref 30.0–36.0)
MCV: 60.4 fL — ABNORMAL LOW (ref 78.0–100.0)
Platelets: 399 10*3/uL (ref 150–400)
RBC: 3.74 MIL/uL — ABNORMAL LOW (ref 3.87–5.11)
RDW: 24.1 % — ABNORMAL HIGH (ref 11.5–15.5)
WBC: 8.7 10*3/uL (ref 4.0–10.5)

## 2012-10-12 LAB — HEMOGLOBIN AND HEMATOCRIT, BLOOD
HCT: 26.5 % — ABNORMAL LOW (ref 36.0–46.0)
Hemoglobin: 8.3 g/dL — ABNORMAL LOW (ref 12.0–15.0)

## 2012-10-12 LAB — PREPARE RBC (CROSSMATCH)

## 2012-10-12 MED ORDER — FUROSEMIDE 10 MG/ML IJ SOLN
10.0000 mg | Freq: Once | INTRAMUSCULAR | Status: AC
  Administered 2012-10-12: 10 mg via INTRAVENOUS
  Filled 2012-10-12: qty 1

## 2012-10-12 MED ORDER — SODIUM CHLORIDE 0.9 % IV SOLN
125.0000 mg | Freq: Once | INTRAVENOUS | Status: AC
  Administered 2012-10-12: 125 mg via INTRAVENOUS
  Filled 2012-10-12: qty 10

## 2012-10-12 NOTE — Progress Notes (Signed)
[facility]                                                                                Patient Demographics  Nancy [name], is a 32 y.o. female, DOB - [birth year], ZOX:[id number], WJX:[id number]  Admit date - 10/11/2012  Admitting Physician Kela Millin, MD  Outpatient Primary MD for the patient is No primary provider on file.  LOS - 1   Chief Complaint  Patient presents with  . Abdominal Pain        Assessment & Plan    1.  RUQ abdominal pain nausea and elevated liver enzymes do to symptomatic cholelithiasis, patient scheduled for laparoscopic cholecystectomy by general surgery who are already following the patient, procedure likely later in the day, patient is symptom free right now, she is getting her third unit of packed RBC transfused to get her chronic iron deficiency anemia improved.   2. Chronic iron deficiency anemia due to severe menorrhagia - will get third unit packed RBC transfused today, monitor H&H, IV for 5 and deficiency. Outpatient followup for menorrhagia.     Code Status: Full  Family Communication: Husband  Disposition Plan: Home   Procedures  laparoscopic cholecystectomy scheduled   Consults CCS   DVT Prophylaxis   SCDs   Lab Results  Component Value Date   PLT 399 10/12/2012    Medications  Scheduled Meds: . furosemide  10 mg Intravenous Once  . sodium chloride  3 mL Intravenous Q12H   Continuous Infusions: . sodium chloride 75 mL/hr (10/11/12 1725)   PRN Meds:.HYDROmorphone (DILAUDID) injection, ondansetron (ZOFRAN) IV, ondansetron, oxyCODONE  Antibiotics    Anti-infectives   None       Time Spent in minutes   35   Susa Raring K M.D on 10/12/2012 at 8:27 AM  Between 7am to 7pm - Pager - [phone]  After 7pm go to [email or web address] - password [email or web address]  And look for the night coverage person covering for me after hours  [facility] Office  [phone]    Subjective:   Nancy [name]  today has, No headache, No chest pain, No abdominal pain - No Nausea, No new weakness tingling or numbness, No Cough - SOB.    Objective:   Filed Vitals:   10/11/12 2150 10/11/12 2250 10/11/12 2350 10/12/12 0557  BP: 116/61 112/64 105/55 108/66  Pulse: 77 81 78 72  Temp: 98.4 F (36.9 C) 97.7 F (36.5 C) 98.5 F (36.9 C) 97.8 F (36.6 C)  TempSrc: Oral Oral Oral Oral  Resp: 18 18 18 19   Height:      Weight:      SpO2:    100%    Wt Readings from Last 3 Encounters:  10/11/12 94.4 kg (208 lb 1.8 oz)  02/08/09 113.853 kg (251 lb)  01/19/09 112.855 kg (248 lb 12.8 oz)     Intake/Output Summary (Last 24 hours) at 10/12/12 0827 Last data filed at 10/12/12 0700  Gross per 24 hour  Intake 1463.83 ml  Output      0 ml  Net 1463.83 ml    Exam Awake Alert, Oriented X 3, No new F.N deficits, Normal affect [place].AT,PERRAL Supple Neck,No JVD,  No cervical lymphadenopathy appriciated.  Symmetrical Chest wall movement, Good air movement bilaterally, CTAB RRR,No Gallops,Rubs or new Murmurs, No Parasternal Heave +ve B.Sounds, Abd Soft, Non tender, No organomegaly appriciated, No rebound - guarding or rigidity. No Cyanosis, Clubbing or edema, No new Rash or bruise     Data Review   Micro Results No results found for this or any previous visit (from the past 240 hour(s)).  Radiology Reports No results found.  CBC  Recent Labs Lab 10/11/12 0755 10/11/12 0852 10/12/12 0417  WBC 10.0 10.3 8.7  HGB 5.7* 5.8* 7.0*  HCT 19.6* 20.2* 22.6*  PLT 475* 464* 399  MCV 55.5* 55.8* 60.4*  MCH 16.1* 16.0* 18.7*  MCHC 29.1* 28.7* 31.0  RDW 19.6* 19.5* 24.1*  LYMPHSABS  --  1.7  --   MONOABS  --  0.4  --   EOSABS  --  0.0  --   BASOSABS  --  0.0  --     Chemistries   Recent Labs Lab 10/11/12 0755 10/12/12 0417  NA 135 135  K 3.8 3.9  CL 104 105  CO2 23 25  GLUCOSE 117* 90  BUN 17 11  CREATININE 0.51 0.58  CALCIUM 8.7 8.5  AST 201* 81*  ALT 102* 115*  ALKPHOS 143* 118*   BILITOT 0.6 0.8   ------------------------------------------------------------------------------------------------------------------ estimated creatinine clearance is 121.3 ml/min (by C-G formula based on Cr of 0.58). ------------------------------------------------------------------------------------------------------------------ No results found for this basename: HGBA1C,  in the last 72 hours ------------------------------------------------------------------------------------------------------------------ No results found for this basename: CHOL, HDL, LDLCALC, TRIG, CHOLHDL, LDLDIRECT,  in the last 72 hours ------------------------------------------------------------------------------------------------------------------ No results found for this basename: TSH, T4TOTAL, FREET3, T3FREE, THYROIDAB,  in the last 72 hours ------------------------------------------------------------------------------------------------------------------  Recent Labs  10/11/12 0852  VITAMINB12 1091*  FOLATE 14.5  FERRITIN 1*  TIBC Not calculated due to Iron <10.  IRON <10*  RETICCTPCT 2.2    Coagulation profile No results found for this basename: INR, PROTIME,  in the last 168 hours  No results found for this basename: DDIMER,  in the last 72 hours  Cardiac Enzymes No results found for this basename: CK, CKMB, TROPONINI, MYOGLOBIN,  in the last 168 hours ------------------------------------------------------------------------------------------------------------------ No components found with this basename: POCBNP,

## 2012-10-12 NOTE — Progress Notes (Signed)
MEDICATION RELATED CONSULT NOTE - INITIAL   Pharmacy Consult for Venofer (formulary substitute to Ferrlecit) Indication: Anemia, low iron  Allergies  Allergen Reactions  . Penicillins Swelling and Rash    Patient Measurements: Height: 5\' 8"  (172.7 cm) (patient was unsure, just a guess.) Weight: 208 lb 1.8 oz (94.4 kg) (patient is "trying to lose weight") IBW/kg (Calculated) : 63.9  Vital Signs: Temp: 97.8 F (36.6 C) (04/19 0557) Temp src: Oral (04/19 0557) BP: 108/66 mmHg (04/19 0557) Pulse Rate: 72 (04/19 0557) Intake/Output from previous day: 04/18 0701 - 04/19 0700 In: 1055.5 [I.V.:410; Blood:645.5] Out: -   Labs:  Recent Labs  10/11/12 0755 10/11/12 0852 10/12/12 0417  WBC 10.0 10.3 8.7  HGB 5.7* 5.8* 7.0*  HCT 19.6* 20.2* 22.6*  PLT 475* 464* 399  CREATININE 0.51  --  0.58  ALBUMIN 3.6  --  3.1*  PROT 7.7  --  6.6  AST 201*  --  81*  ALT 102*  --  115*  ALKPHOS 143*  --  118*  BILITOT 0.6  --  0.8   Estimated Creatinine Clearance: 121.3 ml/min (by C-G formula based on Cr of 0.58).   Medical History: Past Medical History  Diagnosis Date  . Gallstones   . Anemia     Medications:  Scheduled:  . furosemide  10 mg Intravenous Once  . sodium chloride  3 mL Intravenous Q12H  . [DISCONTINUED]  HYDROmorphone (DILAUDID) injection  1 mg Intramuscular Once   Infusions:  . sodium chloride 75 mL/hr (10/11/12 1725)    Assessment: 38 yoF with chronic iron deficiency anemia d/t severe menorrhagia.    Hgb improved from 5.8 to 7; pt to have third unit packed RBC transfused today.  Serum Iron < 10, Ferritin 1.  (TIBC and saturation not calculated)  Folate and Vit B12 and WNL  Goal of Therapy:  Improved serum iron levels, anemia  Plan:   Ferrlecit 125mg  IV  Follow up CBC, iron profile  Lynann Beaver PharmD, BCPS Pager [phone] 10/12/2012 8:54 AM

## 2012-10-12 NOTE — Progress Notes (Signed)
  Subjective: Patient reports pain much less, still with some nausea  Objective: Vital signs in last 24 hours: Temp:  [97.3 F (36.3 C)-98.7 F (37.1 C)] 97.8 F (36.6 C) (04/19 0557) Pulse Rate:  [70-87] 72 (04/19 0557) Resp:  [18-22] 19 (04/19 0557) BP: (104-123)/(40-69) 108/66 mmHg (04/19 0557) SpO2:  [100 %] 100 % (04/19 0557) Weight:  [208 lb 1.8 oz (94.4 kg)] 208 lb 1.8 oz (94.4 kg) (04/18 1300) Last BM Date: 10/11/12  Intake/Output from previous day: 04/18 0701 - 04/19 0700 In: 1055.5 [I.V.:410; Blood:645.5] Out: -  Intake/Output this shift:    Abdomen soft with minimal RUQ tenderness on exam  Lab Results:   Recent Labs  10/11/12 0852 10/12/12 0417  WBC 10.3 8.7  HGB 5.8* 7.0*  HCT 20.2* 22.6*  PLT 464* 399   BMET  Recent Labs  10/11/12 0755 10/12/12 0417  NA 135 135  K 3.8 3.9  CL 104 105  CO2 23 25  GLUCOSE 117* 90  BUN 17 11  CREATININE 0.51 0.58  CALCIUM 8.7 8.5   PT/INR No results found for this basename: LABPROT, INR,  in the last 72 hours ABG No results found for this basename: PHART, PCO2, PO2, HCO3,  in the last 72 hours  Studies/Results: No results found.  Anti-infectives: Anti-infectives   None      Assessment/Plan: s/p * No surgery found *  Symptomatic cholelithiasis and anemia  Did respond to PRBC, but still too anemic, too much of a surgical risk for lap chole today. Will allow liquids If she improves further, may be able to readdress as outpt to let anemia improve.  If she, however, does not improve further, will have to proceed this admission  LOS: 1 day    Nancy [name] A 10/12/2012

## 2012-10-13 ENCOUNTER — Encounter (HOSPITAL_COMMUNITY)
Admission: AD | Disposition: A | Discharge status: Home / Self Care | Payer: Self-pay | Source: Home / Self Care | Attending: Internal Medicine

## 2012-10-13 ENCOUNTER — Encounter (HOSPITAL_COMMUNITY): Payer: Self-pay | Admitting: Anesthesiology

## 2012-10-13 ENCOUNTER — Inpatient Hospital Stay (HOSPITAL_COMMUNITY): Payer: Self-pay | Admitting: Anesthesiology

## 2012-10-13 ENCOUNTER — Inpatient Hospital Stay (HOSPITAL_COMMUNITY): Payer: Self-pay

## 2012-10-13 HISTORY — PX: CHOLECYSTECTOMY: SHX55

## 2012-10-13 LAB — COMPREHENSIVE METABOLIC PANEL
ALT: 85 U/L — ABNORMAL HIGH (ref 0–35)
AST: 43 U/L — ABNORMAL HIGH (ref 0–37)
Albumin: 3.1 g/dL — ABNORMAL LOW (ref 3.5–5.2)
Alkaline Phosphatase: 109 U/L (ref 39–117)
BUN: 8 mg/dL (ref 6–23)
CO2: 27 mEq/L (ref 19–32)
Calcium: 8.7 mg/dL (ref 8.4–10.5)
Chloride: 102 mEq/L (ref 96–112)
Creatinine, Ser: 0.66 mg/dL (ref 0.50–1.10)
GFR calc Af Amer: >90 mL/min (ref >90)
GFR calc non Af Amer: >90 mL/min (ref >90)
Glucose, Bld: 93 mg/dL (ref 70–99)
Potassium: 3.5 mEq/L (ref 3.5–5.1)
Sodium: 135 mEq/L (ref 135–145)
Total Bilirubin: 0.8 mg/dL (ref 0.3–1.2)
Total Protein: 6.9 g/dL (ref 6.0–8.3)

## 2012-10-13 LAB — TYPE AND SCREEN
ABO/RH(D): O POS
Antibody Screen: NEGATIVE
Unit division: 0
Unit division: 0
Unit division: 0

## 2012-10-13 LAB — CBC
HCT: 27.2 % — ABNORMAL LOW (ref 36.0–46.0)
Hemoglobin: 8.3 g/dL — ABNORMAL LOW (ref 12.0–15.0)
MCH: 19.3 pg — ABNORMAL LOW (ref 26.0–34.0)
MCHC: 30.5 g/dL (ref 30.0–36.0)
MCV: 63.4 fL — ABNORMAL LOW (ref 78.0–100.0)
Platelets: 430 10*3/uL — ABNORMAL HIGH (ref 150–400)
RBC: 4.29 MIL/uL (ref 3.87–5.11)
RDW: 26.5 % — ABNORMAL HIGH (ref 11.5–15.5)
WBC: 11.1 10*3/uL — ABNORMAL HIGH (ref 4.0–10.5)

## 2012-10-13 SURGERY — LAPAROSCOPIC CHOLECYSTECTOMY WITH INTRAOPERATIVE CHOLANGIOGRAM
Anesthesia: General | Site: Abdomen | Wound class: Clean Contaminated

## 2012-10-13 MED ORDER — CIPROFLOXACIN IN D5W 400 MG/200ML IV SOLN
400.0000 mg | INTRAVENOUS | Status: AC
  Administered 2012-10-13: 400 mg via INTRAVENOUS

## 2012-10-13 MED ORDER — RINGERS IRRIGATION IR SOLN
Status: DC | PRN
  Administered 2012-10-13: 1

## 2012-10-13 MED ORDER — GLYCOPYRROLATE 0.2 MG/ML IJ SOLN
INTRAMUSCULAR | Status: DC | PRN
  Administered 2012-10-13: .8 mg via INTRAVENOUS

## 2012-10-13 MED ORDER — NEOSTIGMINE METHYLSULFATE 1 MG/ML IJ SOLN
INTRAMUSCULAR | Status: DC | PRN
  Administered 2012-10-13: 5 mg via INTRAVENOUS

## 2012-10-13 MED ORDER — BUPIVACAINE HCL (PF) 0.5 % IJ SOLN
INTRAMUSCULAR | Status: DC | PRN
  Administered 2012-10-13: 20 mL

## 2012-10-13 MED ORDER — 0.9 % SODIUM CHLORIDE (POUR BTL) OPTIME
TOPICAL | Status: DC | PRN
  Administered 2012-10-13: 1000 mL

## 2012-10-13 MED ORDER — FENTANYL CITRATE 0.05 MG/ML IJ SOLN
INTRAMUSCULAR | Status: DC | PRN
  Administered 2012-10-13 (×2): 100 ug via INTRAVENOUS
  Administered 2012-10-13: 50 ug via INTRAVENOUS

## 2012-10-13 MED ORDER — METOCLOPRAMIDE HCL 5 MG/ML IJ SOLN
INTRAMUSCULAR | Status: DC | PRN
  Administered 2012-10-13: 10 mg via INTRAVENOUS

## 2012-10-13 MED ORDER — IOHEXOL 300 MG/ML  SOLN
INTRAMUSCULAR | Status: DC | PRN
  Administered 2012-10-13: 5 mL via INTRAVENOUS

## 2012-10-13 MED ORDER — LACTATED RINGERS IV SOLN
INTRAVENOUS | Status: DC | PRN
  Administered 2012-10-13 (×2): via INTRAVENOUS

## 2012-10-13 MED ORDER — OXYCODONE-ACETAMINOPHEN 5-325 MG PO TABS
1.0000 | ORAL_TABLET | ORAL | Status: DC | PRN
  Administered 2012-10-13: 2 via ORAL
  Filled 2012-10-13: qty 2

## 2012-10-13 MED ORDER — ACETAMINOPHEN 10 MG/ML IV SOLN
INTRAVENOUS | Status: DC | PRN
  Administered 2012-10-13: 1000 mg via INTRAVENOUS

## 2012-10-13 MED ORDER — KETOROLAC TROMETHAMINE 30 MG/ML IJ SOLN
INTRAMUSCULAR | Status: DC | PRN
  Administered 2012-10-13: 30 mg via INTRAVENOUS

## 2012-10-13 MED ORDER — LIDOCAINE HCL 4 % MT SOLN
OROMUCOSAL | Status: DC | PRN
  Administered 2012-10-13: 4 mL via TOPICAL

## 2012-10-13 MED ORDER — SUCCINYLCHOLINE CHLORIDE 20 MG/ML IJ SOLN
INTRAMUSCULAR | Status: DC | PRN
  Administered 2012-10-13: 100 mg via INTRAVENOUS

## 2012-10-13 MED ORDER — LIDOCAINE HCL (CARDIAC) 20 MG/ML IV SOLN
INTRAVENOUS | Status: DC | PRN
  Administered 2012-10-13: 60 mg via INTRAVENOUS

## 2012-10-13 MED ORDER — FENTANYL CITRATE 0.05 MG/ML IJ SOLN
25.0000 ug | INTRAMUSCULAR | Status: DC | PRN
  Administered 2012-10-13 (×2): 50 ug via INTRAVENOUS

## 2012-10-13 MED ORDER — CISATRACURIUM BESYLATE (PF) 10 MG/5ML IV SOLN
INTRAVENOUS | Status: DC | PRN
  Administered 2012-10-13: 8 mg via INTRAVENOUS

## 2012-10-13 MED ORDER — ONDANSETRON HCL 4 MG/2ML IJ SOLN: 4.0000 mg | Freq: Four times a day (QID) | INTRAMUSCULAR | Status: DC | PRN

## 2012-10-13 MED ORDER — DEXAMETHASONE SODIUM PHOSPHATE 10 MG/ML IJ SOLN
INTRAMUSCULAR | Status: DC | PRN
  Administered 2012-10-13: 8 mg via INTRAVENOUS

## 2012-10-13 MED ORDER — HYDROMORPHONE HCL PF 1 MG/ML IJ SOLN
1.0000 mg | INTRAMUSCULAR | Status: DC | PRN
  Administered 2012-10-13 – 2012-10-14 (×2): 1 mg via INTRAVENOUS
  Filled 2012-10-13 (×2): qty 1

## 2012-10-13 MED ORDER — ONDANSETRON HCL 4 MG/2ML IJ SOLN
INTRAMUSCULAR | Status: DC | PRN
  Administered 2012-10-13: 4 mg via INTRAVENOUS

## 2012-10-13 MED ORDER — MIDAZOLAM HCL 5 MG/5ML IJ SOLN
INTRAMUSCULAR | Status: DC | PRN
  Administered 2012-10-13: 2 mg via INTRAVENOUS

## 2012-10-13 MED ORDER — PROPOFOL 10 MG/ML IV BOLUS
INTRAVENOUS | Status: DC | PRN
  Administered 2012-10-13: 200 mg via INTRAVENOUS

## 2012-10-13 SURGICAL SUPPLY — 38 items
APL SKNCLS STERI-STRIP NONHPOA (GAUZE/BANDAGES/DRESSINGS) ×1
APPLIER CLIP 5 13 M/L LIGAMAX5 (MISCELLANEOUS) ×2
APR CLP MED LRG 5 ANG JAW (MISCELLANEOUS) ×1
BAG SPEC RTRVL LRG 6X4 10 (ENDOMECHANICALS) ×1
BANDAGE ADHESIVE 1X3 (GAUZE/BANDAGES/DRESSINGS) ×5 IMPLANT
BENZOIN TINCTURE PRP APPL 2/3 (GAUZE/BANDAGES/DRESSINGS) ×2 IMPLANT
CANISTER SUCTION 2500CC (MISCELLANEOUS) ×2 IMPLANT
CHLORAPREP W/TINT 26ML (MISCELLANEOUS) ×2 IMPLANT
CLIP APPLIE 5 13 M/L LIGAMAX5 (MISCELLANEOUS) ×1 IMPLANT
CLOSURE STERI-STRIP 1/4X4 (GAUZE/BANDAGES/DRESSINGS) ×1 IMPLANT
CLOTH BEACON ORANGE TIMEOUT ST (SAFETY) ×2 IMPLANT
COVER MAYO STAND STRL (DRAPES) ×1 IMPLANT
DECANTER SPIKE VIAL GLASS SM (MISCELLANEOUS) ×2 IMPLANT
DRAPE C-ARM 42X72 X-RAY (DRAPES) ×1 IMPLANT
DRAPE LAPAROSCOPIC ABDOMINAL (DRAPES) ×2 IMPLANT
ELECT REM PT RETURN 9FT ADLT (ELECTROSURGICAL) ×2
ELECTRODE REM PT RTRN 9FT ADLT (ELECTROSURGICAL) ×1 IMPLANT
GLOVE BIOGEL PI IND STRL 7.0 (GLOVE) ×1 IMPLANT
GLOVE BIOGEL PI INDICATOR 7.0 (GLOVE) ×1
GLOVE SURG SIGNA 7.5 PF LTX (GLOVE) ×4 IMPLANT
GOWN STRL NON-REIN LRG LVL3 (GOWN DISPOSABLE) ×2 IMPLANT
GOWN STRL REIN XL XLG (GOWN DISPOSABLE) ×4 IMPLANT
HEMOSTAT SURGICEL 4X8 (HEMOSTASIS) IMPLANT
KIT BASIN OR (CUSTOM PROCEDURE TRAY) ×2 IMPLANT
NS IRRIG 1000ML POUR BTL (IV SOLUTION) ×1 IMPLANT
POUCH SPECIMEN RETRIEVAL 10MM (ENDOMECHANICALS) ×1 IMPLANT
SET CHOLANGIOGRAPH MIX (MISCELLANEOUS) ×1 IMPLANT
SET IRRIG TUBING LAPAROSCOPIC (IRRIGATION / IRRIGATOR) ×2 IMPLANT
SOLUTION ANTI FOG 6CC (MISCELLANEOUS) ×2 IMPLANT
STRIP CLOSURE SKIN 1/2X4 (GAUZE/BANDAGES/DRESSINGS) ×2 IMPLANT
SUT MNCRL AB 4-0 PS2 18 (SUTURE) ×2 IMPLANT
SUT NOVA NAB GS-21 0 18 T12 DT (SUTURE) ×1 IMPLANT
SUT PROLENE 0 SH 30 (SUTURE) ×1 IMPLANT
TOWEL OR 17X26 10 PK STRL BLUE (TOWEL DISPOSABLE) ×2 IMPLANT
TRAY LAP CHOLE (CUSTOM PROCEDURE TRAY) ×2 IMPLANT
TROCAR BLADELESS OPT 5 75 (ENDOMECHANICALS) ×6 IMPLANT
TROCAR XCEL BLUNT TIP 100MML (ENDOMECHANICALS) ×2 IMPLANT
TUBING INSUFFLATION 10FT LAP (TUBING) ×2 IMPLANT

## 2012-10-13 NOTE — Transfer of Care (Signed)
Immediate Anesthesia Transfer of Care Note  Patient: Nancy [name]  Procedure(s) Performed: Procedure(s): LAPAROSCOPIC CHOLECYSTECTOMY WITH INTRAOPERATIVE CHOLANGIOGRAM (N/A)  Patient Location: PACU  Anesthesia Type:General  Level of Consciousness: awake, alert , oriented and patient cooperative  Airway & Oxygen Therapy: Patient Spontanous Breathing and Patient connected to face mask oxygen  Post-op Assessment: Report given to PACU RN, Post -op Vital signs reviewed and stable and Patient moving all extremities X 4  Post vital signs: stable  Complications: No apparent anesthesia complications

## 2012-10-13 NOTE — Progress Notes (Signed)
PATIENT DETAILS Name: Nancy [name] Age: 32 y.o. Sex: female Date of Birth: 03/19/1981 Admit Date: 10/11/2012 Admitting Physician Adeline Joselyn Glassman, MD PCP:No primary provider on file.  Subjective: No major issues overnight  Assessment/Plan: Active Problems: Symptomatic cholelithiasis -for Lap Cholecystectomy today -CCS on board  Chronic iron deficiency anemia due to severe menorrhagia  -s/p 3 units of PRBC this admit -received IV Iron on 4/19-supplement with oral Fe on discharge -pelvic and transvaginal ultrasound-reviewed -outpatient follow up with GYN  Disposition: Remain inpatient  DVT Prophylaxis: SCD's  Code Status: Full Code  Family Communication Daughter at bedside  Procedures:  Lap Cholecystectomy 4/20  CONSULTS:  general surgery  PHYSICAL EXAM: Vital signs in last 24 hours: Filed Vitals:   10/13/12 1048 10/13/12 1050 10/13/12 1055 10/13/12 1100  BP:      Pulse:  96 59   Temp: 98.4 F (36.9 C)     TempSrc:      Resp: 12  15 10   Height:      Weight:      SpO2:  100% 100%     Weight change:  Filed Weights   10/11/12 1300  Weight: 94.4 kg (208 lb 1.8 oz)   Body mass index is 31.65 kg/(m^2).   Gen Exam: Awake and alert with clear speech.   Neck: Supple, No JVD.   Chest: B/L Clear.   CVS: S1 S2 Regular, no murmurs.  Abdomen: soft, BS +, non tender, non distended.  Extremities: no edema, lower extremities warm to touch. Neurologic: Non Focal.   Skin: No Rash.   Wounds: N/A.    Intake/Output from previous day:  Intake/Output Summary (Last 24 hours) at 10/13/12 1118 Last data filed at 10/13/12 1048  Gross per 24 hour  Intake 3100.42 ml  Output    600 ml  Net 2500.42 ml     LAB RESULTS: CBC  Recent Labs Lab 10/11/12 0755 10/11/12 0852 10/12/12 0417 10/12/12 1625 10/13/12 0426  WBC 10.0 10.3 8.7  --  11.1*  HGB 5.7* 5.8* 7.0* 8.3* 8.3*  HCT 19.6* 20.2* 22.6* 26.5* 27.2*  PLT 475* 464* 399  --  430*  MCV 55.5* 55.8*  60.4*  --  63.4*  MCH 16.1* 16.0* 18.7*  --  19.3*  MCHC 29.1* 28.7* 31.0  --  30.5  RDW 19.6* 19.5* 24.1*  --  26.5*  LYMPHSABS  --  1.7  --   --   --   MONOABS  --  0.4  --   --   --   EOSABS  --  0.0  --   --   --   BASOSABS  --  0.0  --   --   --     Chemistries   Recent Labs Lab 10/11/12 0755 10/12/12 0417 10/13/12 0426  NA 135 135 135  K 3.8 3.9 3.5  CL 104 105 102  CO2 23 25 27   GLUCOSE 117* 90 93  BUN 17 11 8   CREATININE 0.51 0.58 0.66  CALCIUM 8.7 8.5 8.7    CBG: No results found for this basename: GLUCAP,  in the last 168 hours  GFR Estimated Creatinine Clearance: 121.3 ml/min (by C-G formula based on Cr of 0.66).  Coagulation profile No results found for this basename: INR, PROTIME,  in the last 168 hours  Cardiac Enzymes No results found for this basename: CK, CKMB, TROPONINI, MYOGLOBIN,  in the last 168 hours  No components found with this basename: POCBNP,  No results found  for this basename: DDIMER,  in the last 72 hours No results found for this basename: HGBA1C,  in the last 72 hours No results found for this basename: CHOL, HDL, LDLCALC, TRIG, CHOLHDL, LDLDIRECT,  in the last 72 hours No results found for this basename: TSH, T4TOTAL, FREET3, T3FREE, THYROIDAB,  in the last 72 hours  Recent Labs  10/11/12 0852  VITAMINB12 1091*  FOLATE 14.5  FERRITIN 1*  TIBC Not calculated due to Iron <10.  IRON <10*  RETICCTPCT 2.2    Recent Labs  10/11/12 0755  LIPASE 33    Urine Studies No results found for this basename: UACOL, UAPR, USPG, UPH, UTP, UGL, UKET, UBIL, UHGB, UNIT, UROB, ULEU, UEPI, UWBC, URBC, UBAC, CAST, CRYS, UCOM, BILUA,  in the last 72 hours  MICROBIOLOGY: No results found for this or any previous visit (from the past 240 hour(s)).  RADIOLOGY STUDIES/RESULTS: Dg Cholangiogram Operative  10/13/2012  *RADIOLOGY REPORT*  Clinical Data:   Laparoscopic cholecystectomy.  INTRAOPERATIVE CHOLANGIOGRAM  Comparison: Ultrasound  10/12/2012  Findings: Intraoperative spot images show normal caliber biliary system.  No evidence of retained stone or obstruction.  Free passage of contrast into the small bowel noted.  IMPRESSION: No evidence of retained stone or obstruction.  These images were submitted for radiologic interpretation only. Please see the procedural report for the amount of contrast and the fluoroscopy time utilized.   Original Report Authenticated By: Charlett Nose, M.D.    US Transvaginal Non-ob  10/12/2012  *RADIOLOGY REPORT*  Clinical Data: Menorrhagia  TRANSABDOMINAL AND TRANSVAGINAL ULTRASOUND OF PELVIS Technique:  Both transabdominal and transvaginal ultrasound examinations of the pelvis were performed. Transabdominal technique was performed for global imaging of the pelvis including uterus, ovaries, adnexal regions, and pelvic cul-de-sac.  It was necessary to proceed with endovaginal exam following the transabdominal exam to visualize the endometrium.  Comparison:  None  Findings:  Uterus: Retroverted.  Measures 8.7 x 4.1 x 5.7 cm.  Endometrium: Normal in thickness and appearance, measuring 6 mm.  Right ovary:  Measures 4.3 x 2.3 x 2.8 cm and is notable for multiple immature peripheral follicles.  Left ovary: Measures 3.9 x 2.4 x 2.3 cm and is notable for multiple immature peripheral follicles.  Other findings: Trace free fluid.  IMPRESSION: Normal sonographic appearance of the uterus.  Endometrial complex measures 6 mm.  Bilateral ovaries are notable for multiple immature peripheral follicles, nonspecific. In the appropriate clinical setting, this could be compatible with the clinical diagnosis of PCOS.   Original Report Authenticated By: Charline Bills, M.D.    US Pelvis Complete  10/12/2012  *RADIOLOGY REPORT*  Clinical Data: Menorrhagia  TRANSABDOMINAL AND TRANSVAGINAL ULTRASOUND OF PELVIS Technique:  Both transabdominal and transvaginal ultrasound examinations of the pelvis were performed. Transabdominal  technique was performed for global imaging of the pelvis including uterus, ovaries, adnexal regions, and pelvic cul-de-sac.  It was necessary to proceed with endovaginal exam following the transabdominal exam to visualize the endometrium.  Comparison:  None  Findings:  Uterus: Retroverted.  Measures 8.7 x 4.1 x 5.7 cm.  Endometrium: Normal in thickness and appearance, measuring 6 mm.  Right ovary:  Measures 4.3 x 2.3 x 2.8 cm and is notable for multiple immature peripheral follicles.  Left ovary: Measures 3.9 x 2.4 x 2.3 cm and is notable for multiple immature peripheral follicles.  Other findings: Trace free fluid.  IMPRESSION: Normal sonographic appearance of the uterus.  Endometrial complex measures 6 mm.  Bilateral ovaries are notable for multiple immature peripheral  follicles, nonspecific. In the appropriate clinical setting, this could be compatible with the clinical diagnosis of PCOS.   Original Report Authenticated By: Charline Bills, M.D.     MEDICATIONS: Scheduled Meds: . [facility] HOLD] sodium chloride  3 mL Intravenous Q12H   Continuous Infusions: . sodium chloride 75 mL/hr (10/11/12 1725)   PRN Meds:.fentaNYL, [MAR HOLD]  HYDROmorphone (DILAUDID) injection, [MAR HOLD] ondansetron (ZOFRAN) IV, ondansetron, [MAR HOLD] ondansetron, [MAR HOLD] oxyCODONE  Antibiotics: Anti-infectives   Start     Dose/Rate Route Frequency Ordered Stop   10/13/12 0715  ciprofloxacin (CIPRO) IVPB 400 mg     400 mg 200 mL/hr over 60 Minutes Intravenous On call to O.R. 10/13/12 1610 10/13/12 0945       Jeoffrey Massed, MD  [facility] Pager:[phone]  If 7PM-7AM, please contact night-coverage [email or web address] Password [email or web address] 10/13/2012, 11:18 AM   LOS: 2 days

## 2012-10-13 NOTE — Op Note (Signed)
Laparoscopic Cholecystectomy with IOC Procedure Note  Indications: This patient presents with symptomatic gallbladder disease and will undergo laparoscopic cholecystectomy.  Pre-operative Diagnosis: Calculus of gallbladder with other cholecystitis, without mention of obstruction  Post-operative Diagnosis: Same  Surgeon: Abigail Miyamoto A   Assistants: 0  Anesthesia: General endotracheal anesthesia  ASA Class: 2  Procedure Details  The patient was seen again in the Holding Room. The risks, benefits, complications, treatment options, and expected outcomes were discussed with the patient. The possibilities of reaction to medication, pulmonary aspiration, perforation of viscus, bleeding, recurrent infection, finding a normal gallbladder, the need for additional procedures, failure to diagnose a condition, the possible need to convert to an open procedure, and creating a complication requiring transfusion or operation were discussed with the patient. The likelihood of improving the patient's symptoms with return to their baseline status is good.  The patient and/or family concurred with the proposed plan, giving informed consent. The site of surgery properly noted. The patient was taken to Operating Room, identified as Nancy [name] and the procedure verified as Laparoscopic Cholecystectomy with Intraoperative Cholangiogram. A Time Out was held and the above information confirmed.  Prior to the induction of general anesthesia, antibiotic prophylaxis was administered. General endotracheal anesthesia was then administered and tolerated well. After the induction, the abdomen was prepped with Chloraprep and draped in the sterile fashion. The patient was positioned in the supine position.  Local anesthetic agent was injected into the skin near the umbilicus and an incision made. We dissected down to the abdominal fascia with blunt dissection.  The fascia was incised vertically and we entered the  peritoneal cavity bluntly.  A pursestring suture of 0-Vicryl was placed around the fascial opening.  The Hasson cannula was inserted and secured with the stay suture.  Pneumoperitoneum was then created with CO2 and tolerated well without any adverse changes in the patient's vital signs. An 11-mm port was placed in the subxiphoid position.  Two 5-mm ports were placed in the right upper quadrant. All skin incisions were infiltrated with a local anesthetic agent before making the incision and placing the trocars.   We positioned the patient in reverse Trendelenburg, tilted slightly to the patient's left.  The gallbladder was identified, the fundus grasped and retracted cephalad. Adhesions were lysed bluntly and with the electrocautery where indicated, taking care not to injure any adjacent organs or viscus. The infundibulum was grasped and retracted laterally, exposing the peritoneum overlying the triangle of Calot. This was then divided and exposed in a blunt fashion. A critical view of the cystic duct and cystic artery was obtained.  The cystic duct was clearly identified and bluntly dissected circumferentially. The cystic duct was ligated with a clip distally.   An incision was made in the cystic duct and the [facility] cholangiogram catheter introduced. The catheter was secured using a clip. A cholangiogram was then obtained which showed good visualization of the distal and proximal biliary tree with no sign of filling defects or obstruction.  Contrast flowed easily into the duodenum. The catheter was then removed.   The cystic duct was then ligated with clips and divided. The cystic artery was identified, dissected free, ligated with clips and divided as well.   The gallbladder was dissected from the liver bed in retrograde fashion with the electrocautery. The gallbladder was removed and placed in an Endocatch sac. The liver bed was irrigated and inspected. Hemostasis was achieved with the electrocautery. Copious  irrigation was utilized and was repeatedly aspirated until clear.  The gallbladder and Endocatch sac were then removed through the umbilical port site.  The pursestring suture was used to close the umbilical fascia.    We again inspected the right upper quadrant for hemostasis.  Pneumoperitoneum was released as we removed the trocars.  4-0 Monocryl was used to close the skin.   Benzoin, steri-strips, and clean dressings were applied. The patient was then extubated and brought to the recovery room in stable condition. Instrument, sponge, and needle counts were correct at closure and at the conclusion of the case.   Findings: Cholecystitis with Cholelithiasis  Estimated Blood Loss: Minimal         Drains: 0         Specimens: Gallbladder           Complications: None; patient tolerated the procedure well.         Disposition: PACU - hemodynamically stable.         Condition: stable

## 2012-10-13 NOTE — Anesthesia Postprocedure Evaluation (Signed)
  Anesthesia Post-op Note  Patient: Nancy [name]  Procedure(s) Performed: Procedure(s): LAPAROSCOPIC CHOLECYSTECTOMY WITH INTRAOPERATIVE CHOLANGIOGRAM (N/A) Patient is awake and responsive. Pain and nausea are reasonably well controlled. Vital signs are stable and clinically acceptable. Oxygen saturation is clinically acceptable. There are no apparent anesthetic complications at this time. Patient is ready for discharge.

## 2012-10-13 NOTE — Anesthesia Preprocedure Evaluation (Addendum)

## 2012-10-13 NOTE — Progress Notes (Signed)
Subjective: Complains of mild discomfort at the umbilicus.  RUQ pain and nausea better  Objective: Vital signs in last 24 hours: Temp:  [97.5 F (36.4 C)-98.9 F (37.2 C)] 98 F (36.7 C) (04/20 0500) Pulse Rate:  [60-81] 60 (04/20 0500) Resp:  [18-20] 20 (04/20 0500) BP: (107-130)/(49-63) 107/49 mmHg (04/20 0500) SpO2:  [100 %] 100 % (04/20 0500) Last BM Date: 10/11/12  Intake/Output from previous day: 04/19 0701 - 04/20 0700 In: 1740 [P.O.:480; I.V.:910; Blood:350] Out: 600 [Urine:600] Intake/Output this shift:    Abdomen soft with mild tenderness  Lab Results:   Recent Labs  10/12/12 0417 10/12/12 1625 10/13/12 0426  WBC 8.7  --  11.1*  HGB 7.0* 8.3* 8.3*  HCT 22.6* 26.5* 27.2*  PLT 399  --  430*   BMET  Recent Labs  10/12/12 0417 10/13/12 0426  NA 135 135  K 3.9 3.5  CL 105 102  CO2 25 27  GLUCOSE 90 93  BUN 11 8  CREATININE 0.58 0.66  CALCIUM 8.5 8.7   PT/INR No results found for this basename: LABPROT, INR,  in the last 72 hours ABG No results found for this basename: PHART, PCO2, PO2, HCO3,  in the last 72 hours  Studies/Results: US Transvaginal Non-ob  10/12/2012  *RADIOLOGY REPORT*  Clinical Data: Menorrhagia  TRANSABDOMINAL AND TRANSVAGINAL ULTRASOUND OF PELVIS Technique:  Both transabdominal and transvaginal ultrasound examinations of the pelvis were performed. Transabdominal technique was performed for global imaging of the pelvis including uterus, ovaries, adnexal regions, and pelvic cul-de-sac.  It was necessary to proceed with endovaginal exam following the transabdominal exam to visualize the endometrium.  Comparison:  None  Findings:  Uterus: Retroverted.  Measures 8.7 x 4.1 x 5.7 cm.  Endometrium: Normal in thickness and appearance, measuring 6 mm.  Right ovary:  Measures 4.3 x 2.3 x 2.8 cm and is notable for multiple immature peripheral follicles.  Left ovary: Measures 3.9 x 2.4 x 2.3 cm and is notable for multiple immature peripheral  follicles.  Other findings: Trace free fluid.  IMPRESSION: Normal sonographic appearance of the uterus.  Endometrial complex measures 6 mm.  Bilateral ovaries are notable for multiple immature peripheral follicles, nonspecific. In the appropriate clinical setting, this could be compatible with the clinical diagnosis of PCOS.   Original Report Authenticated By: Charline Bills, M.D.    US Pelvis Complete  10/12/2012  *RADIOLOGY REPORT*  Clinical Data: Menorrhagia  TRANSABDOMINAL AND TRANSVAGINAL ULTRASOUND OF PELVIS Technique:  Both transabdominal and transvaginal ultrasound examinations of the pelvis were performed. Transabdominal technique was performed for global imaging of the pelvis including uterus, ovaries, adnexal regions, and pelvic cul-de-sac.  It was necessary to proceed with endovaginal exam following the transabdominal exam to visualize the endometrium.  Comparison:  None  Findings:  Uterus: Retroverted.  Measures 8.7 x 4.1 x 5.7 cm.  Endometrium: Normal in thickness and appearance, measuring 6 mm.  Right ovary:  Measures 4.3 x 2.3 x 2.8 cm and is notable for multiple immature peripheral follicles.  Left ovary: Measures 3.9 x 2.4 x 2.3 cm and is notable for multiple immature peripheral follicles.  Other findings: Trace free fluid.  IMPRESSION: Normal sonographic appearance of the uterus.  Endometrial complex measures 6 mm.  Bilateral ovaries are notable for multiple immature peripheral follicles, nonspecific. In the appropriate clinical setting, this could be compatible with the clinical diagnosis of PCOS.   Original Report Authenticated By: Charline Bills, M.D.     Anti-infectives: Anti-infectives   None  Assessment/Plan: s/p * No surgery found *  Symptomatic cholelithiasis  Anemia improved with transfusion.  Will proceed with lap chole today.  I again discussed the risks with the patient and her husband in detail including but not limited to bleeding, infection, bile leak, bile  duct injury, need to convert to an open procedure, etc.  They agree to proceed.  LOS: 2 days    Karlisa Gaubert A 10/13/2012

## 2012-10-14 ENCOUNTER — Encounter (HOSPITAL_COMMUNITY): Payer: Self-pay | Admitting: Surgery

## 2012-10-14 LAB — CBC
HCT: 26.7 % — ABNORMAL LOW (ref 36.0–46.0)
Hemoglobin: 8.4 g/dL — ABNORMAL LOW (ref 12.0–15.0)
MCH: 19.9 pg — ABNORMAL LOW (ref 26.0–34.0)
MCHC: 31.5 g/dL (ref 30.0–36.0)
MCV: 63.1 fL — ABNORMAL LOW (ref 78.0–100.0)
Platelets: 455 10*3/uL — ABNORMAL HIGH (ref 150–400)
RBC: 4.23 MIL/uL (ref 3.87–5.11)
RDW: 27.3 % — ABNORMAL HIGH (ref 11.5–15.5)
WBC: 12.6 10*3/uL — ABNORMAL HIGH (ref 4.0–10.5)

## 2012-10-14 LAB — COMPREHENSIVE METABOLIC PANEL
ALT: 164 U/L — ABNORMAL HIGH (ref 0–35)
AST: 110 U/L — ABNORMAL HIGH (ref 0–37)
Albumin: 3.1 g/dL — ABNORMAL LOW (ref 3.5–5.2)
Alkaline Phosphatase: 172 U/L — ABNORMAL HIGH (ref 39–117)
BUN: 8 mg/dL (ref 6–23)
CO2: 26 mEq/L (ref 19–32)
Calcium: 9.1 mg/dL (ref 8.4–10.5)
Chloride: 102 mEq/L (ref 96–112)
Creatinine, Ser: 0.58 mg/dL (ref 0.50–1.10)
GFR calc Af Amer: >90 mL/min (ref >90)
GFR calc non Af Amer: >90 mL/min (ref >90)
Glucose, Bld: 114 mg/dL — ABNORMAL HIGH (ref 70–99)
Potassium: 3.9 mEq/L (ref 3.5–5.1)
Sodium: 135 mEq/L (ref 135–145)
Total Bilirubin: 1 mg/dL (ref 0.3–1.2)
Total Protein: 6.9 g/dL (ref 6.0–8.3)

## 2012-10-14 MED ORDER — POLYETHYLENE GLYCOL 3350 17 G PO PACK
17.0000 g | PACK | Freq: Every day | ORAL | Status: DC
  Administered 2012-10-14 – 2012-10-16 (×3): 17 g via ORAL
  Filled 2012-10-14 (×3): qty 1

## 2012-10-14 MED ORDER — IBUPROFEN 600 MG PO TABS
600.0000 mg | ORAL_TABLET | Freq: Four times a day (QID) | ORAL | Status: DC | PRN
  Administered 2012-10-14: 600 mg via ORAL
  Filled 2012-10-14: qty 1

## 2012-10-14 MED ORDER — OXYCODONE HCL 5 MG PO TABS
5.0000 mg | ORAL_TABLET | ORAL | Status: DC | PRN
  Administered 2012-10-14 – 2012-10-15 (×3): 5 mg via ORAL
  Filled 2012-10-14 (×4): qty 1

## 2012-10-14 NOTE — Care Management Note (Addendum)
    Page 1 of 2   10/16/2012     11:07:08 AM   CARE MANAGEMENT NOTE 10/16/2012  Patient:  Nancy [name], Nancy [name]   Account Number:  [id number]  Date Initiated:  10/11/2012  Documentation initiated by:  DAVIS,RHONDA  Subjective/Objective Assessment:   pt with severe anemia and low folate and iron levels hgb5.7     Action/Plan:   from home   Anticipated DC Date:  10/16/2012   Anticipated DC Plan:  HOME/SELF CARE  In-house referral  Artist  PCP / [facility]      DC Planning Services  CM consult      [facility] Choice  NA   Choice offered to / List presented to:  NA   DME arranged  NA      DME agency  NA     HH arranged  NA      HH agency  NA   Status of service:  Completed, signed off Medicare Important Message given?  NA - LOS <3 / Initial given by admissions (If response is "NO", the following Medicare IM given date fields will be blank) Date Medicare IM given:   Date Additional Medicare IM given:    Discharge Disposition:  HOME/SELF CARE  Per UR Regulation:  Reviewed for med. necessity/level of care/duration of stay  If discussed at Long Length of Stay Meetings, dates discussed:    Comments:  10/16/12 Nancy Piekarski RN,BSN NCM [phone] D/C HOME TODAY.PATIENT ABLE TO AFFORD SCRIPT FOR IRON & NARCOTIC.THESE MEDS ARE NOT PROVIDED ON MATCH PROGRAM SINCE OTC/NARCOTIC.NO FURTHER D/C NEEDS.  10/14/12 Nancy Occhipinti RN,BSN NCM [phone] PROVIDED W/PCP LIST-PHYSICIAN REFERRAL SERVICE-[facility],GYNECOLOGIST LISTING.PROVIDED W/$4 [facility] LIST FOR MEDS.ALSO WEBSITE FOR HEALTHCARE/DSS,& OTHER COMMUNITY RESOURCES.NO FURTHER D/C NEEDS.  [id number] Davis,Rn,BSN,CCM

## 2012-10-14 NOTE — Evaluation (Signed)
Physical Therapy Evaluation Patient Details Name: Nancy [name] MRN: [id number] DOB: 21-Jan-1981 Today's Date: 10/14/2012 Time: 8295-6213 PT Time Calculation (min): 20 min  PT Assessment / Plan / Recommendation Clinical Impression  32 yo female s/p lap chole. On eval, pt required Min assist for mobility-able to ambulate ~150 feet while holding onto IV pole and husband. Anticipate pt will progress well during and after stay. No follow PT recommendations at this time. Husband states he can be with pt during day, but works in evenings. Instructed pt to walk at least 1 more time today in hallway with husband or nursing. Will follow up in morning to further assess need for walker.     PT Assessment  Patient needs continued PT services    Follow Up Recommendations  No PT follow up    Does the patient have the potential to tolerate intense rehabilitation      Barriers to Discharge        Equipment Recommendations   (to be determined)    Recommendations for Other Services     Frequency Min 3X/week    Precautions / Restrictions Precautions Precautions: None Precaution Comments: abdominal surgery Restrictions Weight Bearing Restrictions: No   Pertinent Vitals/Pain 6/10 abdomen      Mobility  Bed Mobility Bed Mobility: Supine to Sit Supine to Sit: 4: Min assist Sit to Sidelying: Min assist Details for Bed Mobility Assistance: Assist for trunk to upright. Pt preferred for husband to give arm/hand to pull pt up. VCs safety, technique. Increased time. Assist for 1 LE back onto bed. Pt did rely on bedrail.  Transfers Transfers: Sit to Stand;Stand to Sit Sit to Stand: 4: Min assist;From bed Stand to Sit: 4: Min guard;To bed Details for Transfer Assistance: VCs safety, technique, hand placement. Assist to rise.  Ambulation/Gait Ambulation/Gait Assistance: 4: Min assist Ambulation Distance (Feet): 150 Feet Ambulation/Gait Assistance Details: Husband held pt's R hand while pt held  onto IV pole with L hand. Slow gait speed. NO LOB. 4-5 short standing rest breaks. Pt c/o mild dizziness but was able to continue.  Gait Pattern: Decreased step length - right;Decreased step length - left;Decreased stride length    Exercises     PT Diagnosis: Difficulty walking;Acute pain  PT Problem List: Decreased mobility;Pain PT Treatment Interventions: Gait training;Stair training;Functional mobility training;Therapeutic activities;Therapeutic exercise;Patient/family education   PT Goals Acute Rehab PT Goals PT Goal Formulation: With patient Time For Goal Achievement: 10/21/12 Potential to Achieve Goals: Good Pt will go Supine/Side to Sit: with supervision PT Goal: Supine/Side to Sit - Progress: Goal set today Pt will go Sit to Supine/Side: with supervision PT Goal: Sit to Supine/Side - Progress: Goal set today Pt will go Sit to Stand: with supervision PT Goal: Sit to Stand - Progress: Goal set today Pt will Ambulate: 51 - 150 feet;with supervision PT Goal: Ambulate - Progress: Goal set today Pt will Go Up / Down Stairs: 3-5 stairs;with min assist PT Goal: Up/Down Stairs - Progress: Goal set today  Visit Information  Last PT Received On: 10/14/12 Assistance Needed: +1    Subjective Data  Subjective: I feel a little dizzy Patient Stated Goal: home tomorrow. Less pain   Prior Functioning  Home Living Lives With: Spouse Available Help at Discharge: Family Type of Home: Mobile home Home Access: Stairs to enter Entrance Stairs-Number of Steps: 4 Entrance Stairs-Rails: None Home Layout: One level Prior Function Level of Independence: Independent Able to Take Stairs?: Yes Driving: Yes Communication Communication: No difficulties  Cognition  Cognition Arousal/Alertness: Awake/alert Behavior During Therapy: WFL for tasks assessed/performed Overall Cognitive Status: Within Functional Limits for tasks assessed    Extremity/Trunk Assessment Right Lower Extremity  Assessment RLE ROM/Strength/Tone: WFL for tasks assessed Left Lower Extremity Assessment LLE ROM/Strength/Tone: WFL for tasks assessed Trunk Assessment Trunk Assessment: Normal   Balance    End of Session PT - End of Session Activity Tolerance: Patient limited by pain Patient left: in bed;with call bell/phone within reach;with family/visitor present  GP     Rebeca Alert, MPT Pager: [phone]

## 2012-10-14 NOTE — Progress Notes (Signed)
Pt seen.  Many complaints but surgically stable.

## 2012-10-14 NOTE — Progress Notes (Signed)
1 Day Post-Op  Subjective: Complaining of some pain in abdomen and some nausea.  Better with Ginger ale.  Objective: Vital signs in last 24 hours: Temp:  [97.2 F (36.2 C)-98.4 F (36.9 C)] 98 F (36.7 C) (04/21 0510) Pulse Rate:  [59-96] 64 (04/21 0510) Resp:  [8-18] 18 (04/20 2128) BP: (117-133)/(63-74) 117/67 mmHg (04/21 0510) SpO2:  [100 %] 100 % (04/21 0510) Last BM Date: 10/11/12 710 ml blood transfused yesterday. Diet: clears Afebrile, VSS, H/h up to 8.4/26.7 form admit H/H 5.7/19.6;  LFT'S  OK IOC showed no evidence of stones  Intake/Output from previous day: 04/20 0701 - 04/21 0700 In: 1710 [I.V.:1710] Out: -  Intake/Output this shift:    General appearance: alert, cooperative, no distress and still feels bad, just got and IV med for pain. GI: soft, tender, few BS, incisions look fine.  Lab Results:   Recent Labs  10/13/12 0426 10/14/12 0428  WBC 11.1* 12.6*  HGB 8.3* 8.4*  HCT 27.2* 26.7*  PLT 430* 455*    BMET  Recent Labs  10/13/12 0426 10/14/12 0428  NA 135 135  K 3.5 3.9  CL 102 102  CO2 27 26  GLUCOSE 93 114*  BUN 8 8  CREATININE 0.66 0.58  CALCIUM 8.7 9.1   PT/INR No results found for this basename: LABPROT, INR,  in the last 72 hours   Recent Labs Lab 10/11/12 0755 10/12/12 0417 10/13/12 0426 10/14/12 0428  AST 201* 81* 43* 110*  ALT 102* 115* 85* 164*  ALKPHOS 143* 118* 109 172*  BILITOT 0.6 0.8 0.8 1.0  PROT 7.7 6.6 6.9 6.9  ALBUMIN 3.6 3.1* 3.1* 3.1*     Lipase     Component Value Date/Time   LIPASE 33 10/11/2012 0755     Studies/Results: Dg Cholangiogram Operative  10/13/2012  *RADIOLOGY REPORT*  Clinical Data:   Laparoscopic cholecystectomy.  INTRAOPERATIVE CHOLANGIOGRAM  Comparison: Ultrasound 10/12/2012  Findings: Intraoperative spot images show normal caliber biliary system.  No evidence of retained stone or obstruction.  Free passage of contrast into the small bowel noted.  IMPRESSION: No evidence of retained  stone or obstruction.  These images were submitted for radiologic interpretation only. Please see the procedural report for the amount of contrast and the fluoroscopy time utilized.   Original Report Authenticated By: Charlett Nose, M.D.    US Transvaginal Non-ob  10/12/2012  *RADIOLOGY REPORT*  Clinical Data: Menorrhagia  TRANSABDOMINAL AND TRANSVAGINAL ULTRASOUND OF PELVIS Technique:  Both transabdominal and transvaginal ultrasound examinations of the pelvis were performed. Transabdominal technique was performed for global imaging of the pelvis including uterus, ovaries, adnexal regions, and pelvic cul-de-sac.  It was necessary to proceed with endovaginal exam following the transabdominal exam to visualize the endometrium.  Comparison:  None  Findings:  Uterus: Retroverted.  Measures 8.7 x 4.1 x 5.7 cm.  Endometrium: Normal in thickness and appearance, measuring 6 mm.  Right ovary:  Measures 4.3 x 2.3 x 2.8 cm and is notable for multiple immature peripheral follicles.  Left ovary: Measures 3.9 x 2.4 x 2.3 cm and is notable for multiple immature peripheral follicles.  Other findings: Trace free fluid.  IMPRESSION: Normal sonographic appearance of the uterus.  Endometrial complex measures 6 mm.  Bilateral ovaries are notable for multiple immature peripheral follicles, nonspecific. In the appropriate clinical setting, this could be compatible with the clinical diagnosis of PCOS.   Original Report Authenticated By: Charline Bills, M.D.    US Pelvis Complete  10/12/2012  *RADIOLOGY  REPORT*  Clinical Data: Menorrhagia  TRANSABDOMINAL AND TRANSVAGINAL ULTRASOUND OF PELVIS Technique:  Both transabdominal and transvaginal ultrasound examinations of the pelvis were performed. Transabdominal technique was performed for global imaging of the pelvis including uterus, ovaries, adnexal regions, and pelvic cul-de-sac.  It was necessary to proceed with endovaginal exam following the transabdominal exam to visualize the  endometrium.  Comparison:  None  Findings:  Uterus: Retroverted.  Measures 8.7 x 4.1 x 5.7 cm.  Endometrium: Normal in thickness and appearance, measuring 6 mm.  Right ovary:  Measures 4.3 x 2.3 x 2.8 cm and is notable for multiple immature peripheral follicles.  Left ovary: Measures 3.9 x 2.4 x 2.3 cm and is notable for multiple immature peripheral follicles.  Other findings: Trace free fluid.  IMPRESSION: Normal sonographic appearance of the uterus.  Endometrial complex measures 6 mm.  Bilateral ovaries are notable for multiple immature peripheral follicles, nonspecific. In the appropriate clinical setting, this could be compatible with the clinical diagnosis of PCOS.   Original Report Authenticated By: Charline Bills, M.D.     Medications: . sodium chloride  3 mL Intravenous Q12H    Assessment/Plan Calculus of gallbladder with other cholecystitis, without mention of obstruction S/p Laparoscopic Cholecystectomy with IOC,  10/13/2012, Shelly Rubenstein, MD.  Severe uterine bleeding with profound anemia, s/p transfused 3  Units PRBC yesterday.   Plan:  Advance diet as tolerated, mobilize, wean O2 per primary.  From our standpoint she can go home when she can she is tolerating PO's and pain controlled with PO meds.  I will advance diet as tolerated and defer to primary for anemia and work up of this issue.  LOS: 3 days    Terasa Orsini 10/14/2012

## 2012-10-14 NOTE — Progress Notes (Signed)
PATIENT DETAILS Name: Nancy [name] Age: 32 y.o. Sex: female Date of Birth: 1981/06/21 Admit Date: 10/11/2012 Admitting Physician Adeline Joselyn Glassman, MD PCP:No primary provider on file.  Subjective: Pain at incision site-received IV Dilaudid this am-so somewhat drowsy  Assessment/Plan: Active Problems: Symptomatic cholelithiasis -s/p Lap Cholecystectomy 4/20 -abdomen soft-ambulate, advance diet-stop all IV narcotics-explained to patient and family-that we are trying to control pain and not eliminate it. -re-assess later today for potential discharge  Chronic iron deficiency anemia due to severe menorrhagia  -s/p 3 units of PRBC this admit -received IV Iron on 4/19-supplement with oral Fe on discharge -pelvic and transvaginal ultrasound-reviewed -outpatient follow up with GYN-have asked case management to see if she can get this patient a outpatient appt at [facility]  Disposition: Remain inpatient  DVT Prophylaxis: SCD's  Code Status: Full Code  Family Communication Husband at bedside  Procedures:  Lap Cholecystectomy 4/20  CONSULTS:  general surgery  PHYSICAL EXAM: Vital signs in last 24 hours: Filed Vitals:   10/13/12 1150 10/13/12 1440 10/13/12 2128 10/14/12 0510  BP: 118/72 132/63 131/68 117/67  Pulse: 67 65 68 64  Temp: 97.6 F (36.4 C) 97.2 F (36.2 C) 97.7 F (36.5 C) 98 F (36.7 C)  TempSrc:   Oral Oral  Resp: 14 12 18    Height:      Weight:      SpO2: 100% 100% 100% 100%    Weight change:  Filed Weights   10/11/12 1300  Weight: 94.4 kg (208 lb 1.8 oz)   Body mass index is 31.65 kg/(m^2).   Gen Exam: Awake and alert with clear speech.   Neck: Supple, No JVD.   Chest: B/L Clear.   CVS: S1 S2 Regular, no murmurs.  Abdomen: soft, BS +, mildly tender at incision site, non distended.  Extremities: no edema, lower extremities warm to touch. Neurologic: Non Focal.   Skin: No Rash.   Wounds: N/A.    Intake/Output from previous  day:  Intake/Output Summary (Last 24 hours) at 10/14/12 0953 Last data filed at 10/14/12 0601  Gross per 24 hour  Intake   1590 ml  Output      0 ml  Net   1590 ml     LAB RESULTS: CBC  Recent Labs Lab 10/11/12 0755 10/11/12 0852 10/12/12 0417 10/12/12 1625 10/13/12 0426 10/14/12 0428  WBC 10.0 10.3 8.7  --  11.1* 12.6*  HGB 5.7* 5.8* 7.0* 8.3* 8.3* 8.4*  HCT 19.6* 20.2* 22.6* 26.5* 27.2* 26.7*  PLT 475* 464* 399  --  430* 455*  MCV 55.5* 55.8* 60.4*  --  63.4* 63.1*  MCH 16.1* 16.0* 18.7*  --  19.3* 19.9*  MCHC 29.1* 28.7* 31.0  --  30.5 31.5  RDW 19.6* 19.5* 24.1*  --  26.5* 27.3*  LYMPHSABS  --  1.7  --   --   --   --   MONOABS  --  0.4  --   --   --   --   EOSABS  --  0.0  --   --   --   --   BASOSABS  --  0.0  --   --   --   --     Chemistries   Recent Labs Lab 10/11/12 0755 10/12/12 0417 10/13/12 0426 10/14/12 0428  NA 135 135 135 135  K 3.8 3.9 3.5 3.9  CL 104 105 102 102  CO2 23 25 27 26   GLUCOSE 117* 90 93 114*  BUN 17 11 8 8   CREATININE 0.51 0.58 0.66 0.58  CALCIUM 8.7 8.5 8.7 9.1    CBG: No results found for this basename: GLUCAP,  in the last 168 hours  GFR Estimated Creatinine Clearance: 121.3 ml/min (by C-G formula based on Cr of 0.58).  Coagulation profile No results found for this basename: INR, PROTIME,  in the last 168 hours  Cardiac Enzymes No results found for this basename: CK, CKMB, TROPONINI, MYOGLOBIN,  in the last 168 hours  No components found with this basename: POCBNP,  No results found for this basename: DDIMER,  in the last 72 hours No results found for this basename: HGBA1C,  in the last 72 hours No results found for this basename: CHOL, HDL, LDLCALC, TRIG, CHOLHDL, LDLDIRECT,  in the last 72 hours No results found for this basename: TSH, T4TOTAL, FREET3, T3FREE, THYROIDAB,  in the last 72 hours No results found for this basename: VITAMINB12, FOLATE, FERRITIN, TIBC, IRON, RETICCTPCT,  in the last 72 hours No  results found for this basename: LIPASE, AMYLASE,  in the last 72 hours  Urine Studies No results found for this basename: UACOL, UAPR, USPG, UPH, UTP, UGL, UKET, UBIL, UHGB, UNIT, UROB, ULEU, UEPI, UWBC, URBC, UBAC, CAST, CRYS, UCOM, BILUA,  in the last 72 hours  MICROBIOLOGY: No results found for this or any previous visit (from the past 240 hour(s)).  RADIOLOGY STUDIES/RESULTS: Dg Cholangiogram Operative  10/13/2012  *RADIOLOGY REPORT*  Clinical Data:   Laparoscopic cholecystectomy.  INTRAOPERATIVE CHOLANGIOGRAM  Comparison: Ultrasound 10/12/2012  Findings: Intraoperative spot images show normal caliber biliary system.  No evidence of retained stone or obstruction.  Free passage of contrast into the small bowel noted.  IMPRESSION: No evidence of retained stone or obstruction.  These images were submitted for radiologic interpretation only. Please see the procedural report for the amount of contrast and the fluoroscopy time utilized.   Original Report Authenticated By: Charlett Nose, M.D.    US Transvaginal Non-ob  10/12/2012  *RADIOLOGY REPORT*  Clinical Data: Menorrhagia  TRANSABDOMINAL AND TRANSVAGINAL ULTRASOUND OF PELVIS Technique:  Both transabdominal and transvaginal ultrasound examinations of the pelvis were performed. Transabdominal technique was performed for global imaging of the pelvis including uterus, ovaries, adnexal regions, and pelvic cul-de-sac.  It was necessary to proceed with endovaginal exam following the transabdominal exam to visualize the endometrium.  Comparison:  None  Findings:  Uterus: Retroverted.  Measures 8.7 x 4.1 x 5.7 cm.  Endometrium: Normal in thickness and appearance, measuring 6 mm.  Right ovary:  Measures 4.3 x 2.3 x 2.8 cm and is notable for multiple immature peripheral follicles.  Left ovary: Measures 3.9 x 2.4 x 2.3 cm and is notable for multiple immature peripheral follicles.  Other findings: Trace free fluid.  IMPRESSION: Normal sonographic appearance of the  uterus.  Endometrial complex measures 6 mm.  Bilateral ovaries are notable for multiple immature peripheral follicles, nonspecific. In the appropriate clinical setting, this could be compatible with the clinical diagnosis of PCOS.   Original Report Authenticated By: Charline Bills, M.D.    US Pelvis Complete  10/12/2012  *RADIOLOGY REPORT*  Clinical Data: Menorrhagia  TRANSABDOMINAL AND TRANSVAGINAL ULTRASOUND OF PELVIS Technique:  Both transabdominal and transvaginal ultrasound examinations of the pelvis were performed. Transabdominal technique was performed for global imaging of the pelvis including uterus, ovaries, adnexal regions, and pelvic cul-de-sac.  It was necessary to proceed with endovaginal exam following the transabdominal exam to visualize the endometrium.  Comparison:  None  Findings:  Uterus: Retroverted.  Measures 8.7 x 4.1 x 5.7 cm.  Endometrium: Normal in thickness and appearance, measuring 6 mm.  Right ovary:  Measures 4.3 x 2.3 x 2.8 cm and is notable for multiple immature peripheral follicles.  Left ovary: Measures 3.9 x 2.4 x 2.3 cm and is notable for multiple immature peripheral follicles.  Other findings: Trace free fluid.  IMPRESSION: Normal sonographic appearance of the uterus.  Endometrial complex measures 6 mm.  Bilateral ovaries are notable for multiple immature peripheral follicles, nonspecific. In the appropriate clinical setting, this could be compatible with the clinical diagnosis of PCOS.   Original Report Authenticated By: Charline Bills, M.D.     MEDICATIONS: Scheduled Meds: . sodium chloride  3 mL Intravenous Q12H   Continuous Infusions: . sodium chloride 20 mL/hr (10/14/12 0842)   PRN Meds:.ibuprofen, ondansetron (ZOFRAN) IV, ondansetron, ondansetron, oxyCODONE  Antibiotics: Anti-infectives   Start     Dose/Rate Route Frequency Ordered Stop   10/13/12 0715  ciprofloxacin (CIPRO) IVPB 400 mg     400 mg 200 mL/hr over 60 Minutes Intravenous On call to O.R.  10/13/12 1610 10/13/12 0945       Jeoffrey Massed, MD  [facility] Pager:[phone]  If 7PM-7AM, please contact night-coverage [email or web address] Password [facility] 10/14/2012, 9:53 AM   LOS: 3 days

## 2012-10-14 NOTE — Progress Notes (Signed)
PT NOTE  Order received. Chart reviewed. Attempted PT eval. Pt preferred to have pain medicine prior to mobilizing with PT. RN made aware. Will check back, as schedule permits, for PT eval. Thanks. Rebeca Alert, PT [phone]

## 2012-10-15 ENCOUNTER — Inpatient Hospital Stay (HOSPITAL_COMMUNITY): Payer: MEDICAID

## 2012-10-15 LAB — COMPREHENSIVE METABOLIC PANEL
ALT: 219 U/L — ABNORMAL HIGH (ref 0–35)
AST: 217 U/L — ABNORMAL HIGH (ref 0–37)
Albumin: 3.1 g/dL — ABNORMAL LOW (ref 3.5–5.2)
Alkaline Phosphatase: 211 U/L — ABNORMAL HIGH (ref 39–117)
BUN: 12 mg/dL (ref 6–23)
CO2: 27 mEq/L (ref 19–32)
Calcium: 8.8 mg/dL (ref 8.4–10.5)
Chloride: 102 mEq/L (ref 96–112)
Creatinine, Ser: 0.59 mg/dL (ref 0.50–1.10)
GFR calc Af Amer: >90 mL/min (ref >90)
GFR calc non Af Amer: >90 mL/min (ref >90)
Glucose, Bld: 90 mg/dL (ref 70–99)
Potassium: 3.6 mEq/L (ref 3.5–5.1)
Sodium: 136 mEq/L (ref 135–145)
Total Bilirubin: 1.3 mg/dL — ABNORMAL HIGH (ref 0.3–1.2)
Total Protein: 6.6 g/dL (ref 6.0–8.3)

## 2012-10-15 MED ORDER — DOCUSATE SODIUM 100 MG PO CAPS
100.0000 mg | ORAL_CAPSULE | Freq: Two times a day (BID) | ORAL | Status: DC | PRN
  Filled 2012-10-15: qty 1

## 2012-10-15 MED ORDER — TECHNETIUM TC 99M MEBROFENIN IV KIT
5.7000 | PACK | Freq: Once | INTRAVENOUS | Status: AC | PRN
  Administered 2012-10-15: 6 via INTRAVENOUS

## 2012-10-15 NOTE — Progress Notes (Addendum)
PATIENT DETAILS Name: Nancy [name] Age: 32 y.o. Sex: female Date of Birth: 12-01-1980 Admit Date: 10/11/2012 Admitting Physician Adeline Joselyn Glassman, MD PCP:No primary provider on file.  Subjective: Looks better than yesterday-pain more controlled. Tolerating diet.   Assessment/Plan: Active Problems: Symptomatic cholelithiasis -s/p Lap Cholecystectomy 4/20 -abdomen soft,now on regular diet - Pain controlled with oral narcotics - Bump in her LFTs-spoke with [facility]-attending surgeon to reevaluate to see if we need to do further studies or can discharge patient today  Chronic iron deficiency anemia due to severe menorrhagia  -s/p 3 units of PRBC this admit -received IV Iron on 4/19-supplement with oral Fe on discharge -pelvic and transvaginal ultrasound-reviewed -outpatient follow up with GYN-have asked case management to see if she can get this patient a outpatient appt at [facility] women's clinic  Disposition: Remain inpatient- potential discharge later today after cleared by central [place] surgery.  DVT Prophylaxis: SCD's  Code Status: Full Code  Family Communication Husband at bedside  Procedures:  Lap Cholecystectomy 4/20  CONSULTS:  general surgery  PHYSICAL EXAM: Vital signs in last 24 hours: Filed Vitals:   10/14/12 0510 10/14/12 1436 10/14/12 2058 10/15/12 0613  BP: 117/67 111/64 116/69 113/60  Pulse: 64 62 77 74  Temp: 98 F (36.7 C) 97.3 F (36.3 C) 98.3 F (36.8 C) 98.4 F (36.9 C)  TempSrc: Oral Oral Oral Oral  Resp:  18 18 18   Height:      Weight:      SpO2: 100% 100% 99% 99%    Weight change:  Filed Weights   10/11/12 1300  Weight: 94.4 kg (208 lb 1.8 oz)   Body mass index is 31.65 kg/(m^2).   Gen Exam: Awake and alert with clear speech.   Neck: Supple, No JVD.   Chest: B/L Clear.   CVS: S1 S2 Regular, no murmurs.  Abdomen: soft, BS +, mildly tender at incision site, non distended.  Extremities: no edema, lower extremities warm to  touch. Neurologic: Non Focal.   Skin: No Rash.   Wounds: N/A.    Intake/Output from previous day:  Intake/Output Summary (Last 24 hours) at 10/15/12 1130 Last data filed at 10/14/12 2200  Gross per 24 hour  Intake   1039 ml  Output      0 ml  Net   1039 ml     LAB RESULTS: CBC  Recent Labs Lab 10/11/12 0755 10/11/12 0852 10/12/12 0417 10/12/12 1625 10/13/12 0426 10/14/12 0428  WBC 10.0 10.3 8.7  --  11.1* 12.6*  HGB 5.7* 5.8* 7.0* 8.3* 8.3* 8.4*  HCT 19.6* 20.2* 22.6* 26.5* 27.2* 26.7*  PLT 475* 464* 399  --  430* 455*  MCV 55.5* 55.8* 60.4*  --  63.4* 63.1*  MCH 16.1* 16.0* 18.7*  --  19.3* 19.9*  MCHC 29.1* 28.7* 31.0  --  30.5 31.5  RDW 19.6* 19.5* 24.1*  --  26.5* 27.3*  LYMPHSABS  --  1.7  --   --   --   --   MONOABS  --  0.4  --   --   --   --   EOSABS  --  0.0  --   --   --   --   BASOSABS  --  0.0  --   --   --   --     Chemistries   Recent Labs Lab 10/11/12 0755 10/12/12 0417 10/13/12 0426 10/14/12 0428 10/15/12 0505  NA 135 135 135 135 136  K 3.8 3.9  3.5 3.9 3.6  CL 104 105 102 102 102  CO2 23 25 27 26 27   GLUCOSE 117* 90 93 114* 90  BUN 17 11 8 8 12   CREATININE 0.51 0.58 0.66 0.58 0.59  CALCIUM 8.7 8.5 8.7 9.1 8.8    CBG: No results found for this basename: GLUCAP,  in the last 168 hours  GFR Estimated Creatinine Clearance: 121.3 ml/min (by C-G formula based on Cr of 0.59).  Coagulation profile No results found for this basename: INR, PROTIME,  in the last 168 hours  Cardiac Enzymes No results found for this basename: CK, CKMB, TROPONINI, MYOGLOBIN,  in the last 168 hours  No components found with this basename: POCBNP,  No results found for this basename: DDIMER,  in the last 72 hours No results found for this basename: HGBA1C,  in the last 72 hours No results found for this basename: CHOL, HDL, LDLCALC, TRIG, CHOLHDL, LDLDIRECT,  in the last 72 hours No results found for this basename: TSH, T4TOTAL, FREET3, T3FREE, THYROIDAB,   in the last 72 hours No results found for this basename: VITAMINB12, FOLATE, FERRITIN, TIBC, IRON, RETICCTPCT,  in the last 72 hours No results found for this basename: LIPASE, AMYLASE,  in the last 72 hours  Urine Studies No results found for this basename: UACOL, UAPR, USPG, UPH, UTP, UGL, UKET, UBIL, UHGB, UNIT, UROB, ULEU, UEPI, UWBC, URBC, UBAC, CAST, CRYS, UCOM, BILUA,  in the last 72 hours  MICROBIOLOGY: No results found for this or any previous visit (from the past 240 hour(s)).  RADIOLOGY STUDIES/RESULTS: Dg Cholangiogram Operative  10/13/2012  *RADIOLOGY REPORT*  Clinical Data:   Laparoscopic cholecystectomy.  INTRAOPERATIVE CHOLANGIOGRAM  Comparison: Ultrasound 10/12/2012  Findings: Intraoperative spot images show normal caliber biliary system.  No evidence of retained stone or obstruction.  Free passage of contrast into the small bowel noted.  IMPRESSION: No evidence of retained stone or obstruction.  These images were submitted for radiologic interpretation only. Please see the procedural report for the amount of contrast and the fluoroscopy time utilized.   Original Report Authenticated By: Charlett Nose, M.D.    US Transvaginal Non-ob  10/12/2012  *RADIOLOGY REPORT*  Clinical Data: Menorrhagia  TRANSABDOMINAL AND TRANSVAGINAL ULTRASOUND OF PELVIS Technique:  Both transabdominal and transvaginal ultrasound examinations of the pelvis were performed. Transabdominal technique was performed for global imaging of the pelvis including uterus, ovaries, adnexal regions, and pelvic cul-de-sac.  It was necessary to proceed with endovaginal exam following the transabdominal exam to visualize the endometrium.  Comparison:  None  Findings:  Uterus: Retroverted.  Measures 8.7 x 4.1 x 5.7 cm.  Endometrium: Normal in thickness and appearance, measuring 6 mm.  Right ovary:  Measures 4.3 x 2.3 x 2.8 cm and is notable for multiple immature peripheral follicles.  Left ovary: Measures 3.9 x 2.4 x 2.3 cm and is  notable for multiple immature peripheral follicles.  Other findings: Trace free fluid.  IMPRESSION: Normal sonographic appearance of the uterus.  Endometrial complex measures 6 mm.  Bilateral ovaries are notable for multiple immature peripheral follicles, nonspecific. In the appropriate clinical setting, this could be compatible with the clinical diagnosis of PCOS.   Original Report Authenticated By: Charline Bills, M.D.    US Pelvis Complete  10/12/2012  *RADIOLOGY REPORT*  Clinical Data: Menorrhagia  TRANSABDOMINAL AND TRANSVAGINAL ULTRASOUND OF PELVIS Technique:  Both transabdominal and transvaginal ultrasound examinations of the pelvis were performed. Transabdominal technique was performed for global imaging of the pelvis including uterus, ovaries,  adnexal regions, and pelvic cul-de-sac.  It was necessary to proceed with endovaginal exam following the transabdominal exam to visualize the endometrium.  Comparison:  None  Findings:  Uterus: Retroverted.  Measures 8.7 x 4.1 x 5.7 cm.  Endometrium: Normal in thickness and appearance, measuring 6 mm.  Right ovary:  Measures 4.3 x 2.3 x 2.8 cm and is notable for multiple immature peripheral follicles.  Left ovary: Measures 3.9 x 2.4 x 2.3 cm and is notable for multiple immature peripheral follicles.  Other findings: Trace free fluid.  IMPRESSION: Normal sonographic appearance of the uterus.  Endometrial complex measures 6 mm.  Bilateral ovaries are notable for multiple immature peripheral follicles, nonspecific. In the appropriate clinical setting, this could be compatible with the clinical diagnosis of PCOS.   Original Report Authenticated By: Charline Bills, M.D.     MEDICATIONS: Scheduled Meds: . polyethylene glycol  17 g Oral Daily  . sodium chloride  3 mL Intravenous Q12H   Continuous Infusions: . sodium chloride Stopped (10/14/12 1849)   PRN Meds:.docusate sodium, ibuprofen, ondansetron (ZOFRAN) IV, ondansetron, ondansetron,  oxyCODONE  Antibiotics: Anti-infectives   Start     Dose/Rate Route Frequency Ordered Stop   10/13/12 0715  ciprofloxacin (CIPRO) IVPB 400 mg     400 mg 200 mL/hr over 60 Minutes Intravenous On call to O.R. 10/13/12 9629 10/13/12 0945       Jeoffrey Massed, MD  [facility] Pager:[phone]  If 7PM-7AM, please contact night-coverage [email or web address] Password [email or web address] 10/15/2012, 11:30 AM   LOS: 4 days

## 2012-10-15 NOTE — Progress Notes (Signed)
Patient ID: Nancy [name], female   DOB: Apr 06, 1981, 32 y.o.   MRN: [id number] 2 Days Post-Op  Subjective: Complaining of some pain in abdomen and some nausea.  Better then yesterday however.  Tolerating small amounts of food  Objective: Vital signs in last 24 hours: Temp:  [97.3 F (36.3 C)-98.4 F (36.9 C)] 98.4 F (36.9 C) (04/22 4098) Pulse Rate:  [62-77] 74 (04/22 0613) Resp:  [18] 18 (04/22 0613) BP: (111-116)/(60-69) 113/60 mmHg (04/22 0613) SpO2:  [99 %-100 %] 99 % (04/22 0613) Last BM Date: 10/11/12  Intake/Output from previous day: 04/21 0701 - 04/22 0700 In: 1279 [P.O.:960; I.V.:319] Out: -  Intake/Output this shift:    General appearance: alert, cooperative, no distress. GI: soft, tender, active bowel sounds, incisions look fine.  Lab Results:   Recent Labs  10/13/12 0426 10/14/12 0428  WBC 11.1* 12.6*  HGB 8.3* 8.4*  HCT 27.2* 26.7*  PLT 430* 455*    BMET  Recent Labs  10/14/12 0428 10/15/12 0505  NA 135 136  K 3.9 3.6  CL 102 102  CO2 26 27  GLUCOSE 114* 90  BUN 8 12  CREATININE 0.58 0.59  CALCIUM 9.1 8.8   PT/INR No results found for this basename: LABPROT, INR,  in the last 72 hours   Recent Labs Lab 10/11/12 0755 10/12/12 0417 10/13/12 0426 10/14/12 0428 10/15/12 0505  AST 201* 81* 43* 110* 217*  ALT 102* 115* 85* 164* 219*  ALKPHOS 143* 118* 109 172* 211*  BILITOT 0.6 0.8 0.8 1.0 1.3*  PROT 7.7 6.6 6.9 6.9 6.6  ALBUMIN 3.6 3.1* 3.1* 3.1* 3.1*     Lipase     Component Value Date/Time   LIPASE 33 10/11/2012 0755     Studies/Results: Dg Cholangiogram Operative  10/13/2012  *RADIOLOGY REPORT*  Clinical Data:   Laparoscopic cholecystectomy.  INTRAOPERATIVE CHOLANGIOGRAM  Comparison: Ultrasound 10/12/2012  Findings: Intraoperative spot images show normal caliber biliary system.  No evidence of retained stone or obstruction.  Free passage of contrast into the small bowel noted.  IMPRESSION: No evidence of retained stone or  obstruction.  These images were submitted for radiologic interpretation only. Please see the procedural report for the amount of contrast and the fluoroscopy time utilized.   Original Report Authenticated By: Charlett Nose, M.D.     Medications: . polyethylene glycol  17 g Oral Daily  . sodium chloride  3 mL Intravenous Q12H    Assessment/Plan Calculus of gallbladder with other cholecystitis, without mention of obstruction S/p Laparoscopic Cholecystectomy with IOC,  10/13/2012, Shelly Rubenstein, MD.  Severe uterine bleeding with profound anemia, s/p transfused 3  Units PRBC yesterday.   Plan:  Advance diet as tolerated, mobilize, From our standpoint she can go home when she can she is tolerating PO's and pain controlled with PO meds.  Will need follow up with [place] in 2-3 weeks.       LOS: 4 days    Nancy [name] 10/15/2012  Addendum  Pts LFTs and bilirubin have bumped from yesterday, will have Dr. Luisa Hart see patient, Will hold on discharge for now.  Nancy [name] 9:41 AM 10/15/2012

## 2012-10-15 NOTE — Progress Notes (Signed)
PT Cancellation Note  Patient Details Name: Nancy [name] MRN: [id number] DOB: [birth year]   Cancelled Treatment:    Reason Eval/Treat Not Completed: Pain limiting ability to participate Dizzy from pain meds.  Not agreeable to therapy at this time.   Illona Bulman,KATHrine E 10/15/2012, 10:36 AM Zenovia Jarred, PT, DPT 10/15/2012 Pager: [phone]

## 2012-10-15 NOTE — Progress Notes (Signed)
Check HIDA  To evaluate elevated LFT increase.

## 2012-10-16 LAB — COMPREHENSIVE METABOLIC PANEL
ALT: 155 U/L — ABNORMAL HIGH (ref 0–35)
AST: 62 U/L — ABNORMAL HIGH (ref 0–37)
Albumin: 3.3 g/dL — ABNORMAL LOW (ref 3.5–5.2)
Alkaline Phosphatase: 197 U/L — ABNORMAL HIGH (ref 39–117)
BUN: 11 mg/dL (ref 6–23)
CO2: 29 mEq/L (ref 19–32)
Calcium: 9.2 mg/dL (ref 8.4–10.5)
Chloride: 103 mEq/L (ref 96–112)
Creatinine, Ser: 0.57 mg/dL (ref 0.50–1.10)
GFR calc Af Amer: >90 mL/min (ref >90)
GFR calc non Af Amer: >90 mL/min (ref >90)
Glucose, Bld: 88 mg/dL (ref 70–99)
Potassium: 3.9 mEq/L (ref 3.5–5.1)
Sodium: 137 mEq/L (ref 135–145)
Total Bilirubin: 0.8 mg/dL (ref 0.3–1.2)
Total Protein: 7.1 g/dL (ref 6.0–8.3)

## 2012-10-16 LAB — CBC
HCT: 28.4 % — ABNORMAL LOW (ref 36.0–46.0)
Hemoglobin: 8.8 g/dL — ABNORMAL LOW (ref 12.0–15.0)
MCH: 20 pg — ABNORMAL LOW (ref 26.0–34.0)
MCHC: 31 g/dL (ref 30.0–36.0)
MCV: 64.5 fL — ABNORMAL LOW (ref 78.0–100.0)
Platelets: 373 10*3/uL (ref 150–400)
RBC: 4.4 MIL/uL (ref 3.87–5.11)
RDW: 30 % — ABNORMAL HIGH (ref 11.5–15.5)
WBC: 10.8 10*3/uL — ABNORMAL HIGH (ref 4.0–10.5)

## 2012-10-16 MED ORDER — FERROUS SULFATE 325 (65 FE) MG PO TABS: 325.0000 mg | ORAL_TABLET | Freq: Every day | ORAL | Status: DC

## 2012-10-16 MED ORDER — OXYCODONE HCL 5 MG PO TABS: 5.0000 mg | ORAL_TABLET | ORAL | Status: DC | PRN

## 2012-10-16 NOTE — Discharge Summary (Addendum)
Physician Discharge Summary  Nancy [name] ZOX:[id number] DOB: 27-May-1981 DOA: 10/11/2012  PCP: No primary provider on file.  Admit date: 10/11/2012 Discharge date: 10/16/2012  Time spent: 50* minutes  Recommendations for Outpatient Follow-up:  1. Follow up GYN as outpatient  Discharge Diagnoses:  Active Problems:   RUQ abdominal pain   Elevated LFTs   Anemia   Cholelithiasis   Discharge Condition:  Stable  Diet recommendation: regular  Filed Weights   10/11/12 1300  Weight: 94.4 kg (208 lb 1.8 oz)    History of present illness:  32 y.o. female with past medical history significant for gallstones and menorrhagia with anemia who presents with above complaints. She states that at about 3 AM she was awakened by severe or right upper quadrant pain along with nausea and vomiting. She came to the ED and labwork was done she was found to have elevated LFTs. She reports that she had a prior episode of right upper quadrant pain with nausea and vomiting in the past, and had an ultrasound done August of 2013 which showed gallstones with no evidence of cholecystitis.  Also in the ED patient's CBC showed a hemoglobin of 5.7, she admits to menorrhagia and states that about some 7 years or so she did see a gynecologist and was put on a Nuva ring but was unable to afford it and subsequently stopped it and has not followed up with any gynecologist since then, she admits to dizziness and sometimes and easy fatigability. She denies chest pain, melena and no hematochezia.   Hospital Course:  Symptomatic cholelithiasis  -s/p Lap Cholecystectomy 4/20  -abdomen soft,now on regular diet  - Pain controlled with oral narcotics  - patient's LFT"s were elevate wich is now improving. She had HIDa scan which was negative, Surgeon has cleared her for dioscharge, she will follow up surgery in 2 weeks  Chronic iron deficiency anemia due to severe menorrhagia  -s/p 3 units of PRBC this admit  -received IV  Iron on 4/19-supplement with oral Fe on discharge  -pelvic and transvaginal ultrasound-reviewed , has multiple cysts in the ovaries. -outpatient follow up with GYN-have asked case management to see if she can get this patient a outpatient appt at [facility] women's clinic  - patient will be discharged on ferrous sulfate 325 mg po daily  Anemia She is s/p three units prbc Will send her on ferrous sulfate 325 mg po daily Will follow up OB GYN as outpatient Hb is stable today at 8.8  Procedures:  Laparoscopic cholecystectomy  Consultations:  surgery  Discharge Exam: Filed Vitals:   10/15/12 0613 10/15/12 1356 10/15/12 2148 10/16/12 0509  BP: 113/60 118/67 119/67 113/64  Pulse: 74 79 82 84  Temp: 98.4 F (36.9 C) 98.1 F (36.7 C) 98.7 F (37.1 C) 98.1 F (36.7 C)  TempSrc: Oral Oral Oral Oral  Resp: 18 18 18 18   Height:      Weight:      SpO2: 99% 98% 100% 96%    General: Appear in no acute distress Cardiovascular: s1s2 RRR Respiratory: Clear bilaterally  Discharge Instructions  Discharge Orders   Future Appointments Provider Department Dept Phone   10/29/2012 10:50 AM Shelly Rubenstein, MD [facility], [place] [phone]   11/01/2012 11:30 AM Adam Phenix, MD [facility] [phone]   Future Orders Complete By Expires     Diet - low sodium heart healthy  As directed     Increase activity slowly  As directed  Medication List    TAKE these medications       ferrous sulfate 325 (65 FE) MG tablet  Commonly known as:  FERROUSUL  Take 1 tablet (325 mg total) by mouth daily with breakfast.     ibuprofen 200 MG tablet  Commonly known as:  ADVIL,MOTRIN  Take 600 mg by mouth every 8 (eight) hours as needed for pain.     oxyCODONE 5 MG immediate release tablet  Commonly known as:  Oxy IR/ROXICODONE  Take 1 tablet (5 mg total) by mouth every 4 (four) hours as needed.           Follow-up Information   Follow up with Shelly Rubenstein, MD  On 10/29/2012. (apt is 10-29-12 @ 10:50.)    Contact information:   [address] [place] [address] [phone]       Follow up with [facility].   Contact information:   [address] [place] [address] [phone]      Follow up On 10/22/2012. (3:30p)    Contact information:   [facility] [address] [place]       The results of significant diagnostics from this hospitalization (including imaging, microbiology, ancillary and laboratory) are listed below for reference.    Significant Diagnostic Studies: Dg Cholangiogram Operative  10/13/2012  *RADIOLOGY REPORT*  Clinical Data:   Laparoscopic cholecystectomy.  INTRAOPERATIVE CHOLANGIOGRAM  Comparison: Ultrasound 10/12/2012  Findings: Intraoperative spot images show normal caliber biliary system.  No evidence of retained stone or obstruction.  Free passage of contrast into the small bowel noted.  IMPRESSION: No evidence of retained stone or obstruction.  These images were submitted for radiologic interpretation only. Please see the procedural report for the amount of contrast and the fluoroscopy time utilized.   Original Report Authenticated By: Charlett Nose, M.D.    Nm Hepatobiliary  10/15/2012  *RADIOLOGY REPORT*  NUCLEAR MEDICINE HEPATOHBILIARY INCLUDE GB  Clinical data:  Elevated liver enzymes post cholecystectomy.  Views:  Anterior right upper quadrant  Radiopharmaceutical: Technetium 60m Choletec  Dose:  5.7 mCi  Route of administration:  Intravenous  Findings: Liver uptake of radiotracer is normal.  Gallbladder is known to be absent.  There is prompt visualization of small bowel, consistent with patency of the common bile duct.  There is no ectopic radiotracer to suggest bile leak.  IMPRESSION: No evidence suggesting bile leak.  Common bile duct patent. Gallbladder absent.   Original Report Authenticated By: Bretta Bang, M.D.    US Transvaginal  Non-ob  10/12/2012  *RADIOLOGY REPORT*  Clinical Data: Menorrhagia  TRANSABDOMINAL AND TRANSVAGINAL ULTRASOUND OF PELVIS Technique:  Both transabdominal and transvaginal ultrasound examinations of the pelvis were performed. Transabdominal technique was performed for global imaging of the pelvis including uterus, ovaries, adnexal regions, and pelvic cul-de-sac.  It was necessary to proceed with endovaginal exam following the transabdominal exam to visualize the endometrium.  Comparison:  None  Findings:  Uterus: Retroverted.  Measures 8.7 x 4.1 x 5.7 cm.  Endometrium: Normal in thickness and appearance, measuring 6 mm.  Right ovary:  Measures 4.3 x 2.3 x 2.8 cm and is notable for multiple immature peripheral follicles.  Left ovary: Measures 3.9 x 2.4 x 2.3 cm and is notable for multiple immature peripheral follicles.  Other findings: Trace free fluid.  IMPRESSION: Normal sonographic appearance of the uterus.  Endometrial complex measures 6 mm.  Bilateral ovaries are notable for multiple immature peripheral follicles,  nonspecific. In the appropriate clinical setting, this could be compatible with the clinical diagnosis of PCOS.   Original Report Authenticated By: Charline Bills, M.D.    US Pelvis Complete  10/12/2012  *RADIOLOGY REPORT*  Clinical Data: Menorrhagia  TRANSABDOMINAL AND TRANSVAGINAL ULTRASOUND OF PELVIS Technique:  Both transabdominal and transvaginal ultrasound examinations of the pelvis were performed. Transabdominal technique was performed for global imaging of the pelvis including uterus, ovaries, adnexal regions, and pelvic cul-de-sac.  It was necessary to proceed with endovaginal exam following the transabdominal exam to visualize the endometrium.  Comparison:  None  Findings:  Uterus: Retroverted.  Measures 8.7 x 4.1 x 5.7 cm.  Endometrium: Normal in thickness and appearance, measuring 6 mm.  Right ovary:  Measures 4.3 x 2.3 x 2.8 cm and is notable for multiple immature peripheral follicles.   Left ovary: Measures 3.9 x 2.4 x 2.3 cm and is notable for multiple immature peripheral follicles.  Other findings: Trace free fluid.  IMPRESSION: Normal sonographic appearance of the uterus.  Endometrial complex measures 6 mm.  Bilateral ovaries are notable for multiple immature peripheral follicles, nonspecific. In the appropriate clinical setting, this could be compatible with the clinical diagnosis of PCOS.   Original Report Authenticated By: Charline Bills, M.D.     Microbiology: No results found for this or any previous visit (from the past 240 hour(s)).   Labs: Basic Metabolic Panel:  Recent Labs Lab 10/12/12 0417 10/13/12 0426 10/14/12 0428 10/15/12 0505 10/16/12 0505  NA 135 135 135 136 137  K 3.9 3.5 3.9 3.6 3.9  CL 105 102 102 102 103  CO2 25 27 26 27 29   GLUCOSE 90 93 114* 90 88  BUN 11 8 8 12 11   CREATININE 0.58 0.66 0.58 0.59 0.57  CALCIUM 8.5 8.7 9.1 8.8 9.2   Liver Function Tests:  Recent Labs Lab 10/12/12 0417 10/13/12 0426 10/14/12 0428 10/15/12 0505 10/16/12 0505  AST 81* 43* 110* 217* 62*  ALT 115* 85* 164* 219* 155*  ALKPHOS 118* 109 172* 211* 197*  BILITOT 0.8 0.8 1.0 1.3* 0.8  PROT 6.6 6.9 6.9 6.6 7.1  ALBUMIN 3.1* 3.1* 3.1* 3.1* 3.3*    Recent Labs Lab 10/11/12 0755  LIPASE 33   No results found for this basename: AMMONIA,  in the last 168 hours CBC:  Recent Labs Lab 10/11/12 0852 10/12/12 0417 10/12/12 1625 10/13/12 0426 10/14/12 0428 10/16/12 0505  WBC 10.3 8.7  --  11.1* 12.6* 10.8*  NEUTROABS 8.2*  --   --   --   --   --   HGB 5.8* 7.0* 8.3* 8.3* 8.4* 8.8*  HCT 20.2* 22.6* 26.5* 27.2* 26.7* 28.4*  MCV 55.8* 60.4*  --  63.4* 63.1* 64.5*  PLT 464* 399  --  430* 455* 373   Cardiac Enzymes: No results found for this basename: CKTOTAL, CKMB, CKMBINDEX, TROPONINI,  in the last 168 hours BNP: BNP (last 3 results) No results found for this basename: PROBNP,  in the last 8760 hours CBG: No results found for this basename:  GLUCAP,  in the last 168 hours     Signed:  Doriann Zuch S  [facility] 10/16/2012, 10:58 AM

## 2012-10-16 NOTE — Progress Notes (Signed)
Patient ID: Nancy [name], female   DOB: 1981/03/14, 32 y.o.   MRN: [id number] 3 Days Post-Op  Subjective: Better today, tolerating diet, min pain, denies vomiting, just a little nausea at times.  Objective: Vital signs in last 24 hours: Temp:  [98.1 F (36.7 C)-98.7 F (37.1 C)] 98.1 F (36.7 C) (04/23 0509) Pulse Rate:  [79-84] 84 (04/23 0509) Resp:  [18] 18 (04/23 0509) BP: (113-119)/(64-67) 113/64 mmHg (04/23 0509) SpO2:  [96 %-100 %] 96 % (04/23 0509) Last BM Date: 10/11/12  Intake/Output from previous day: 04/22 0701 - 04/23 0700 In: 480 [P.O.:480] Out: -  Intake/Output this shift:    General appearance: alert, cooperative, no distress. GI: soft, tender over incisions but better then yesterday, active bowel sounds, incisions look fine.  Lab Results:   Recent Labs  10/14/12 0428 10/16/12 0505  WBC 12.6* 10.8*  HGB 8.4* 8.8*  HCT 26.7* 28.4*  PLT 455* 373    BMET  Recent Labs  10/15/12 0505 10/16/12 0505  NA 136 137  K 3.6 3.9  CL 102 103  CO2 27 29  GLUCOSE 90 88  BUN 12 11  CREATININE 0.59 0.57  CALCIUM 8.8 9.2   PT/INR No results found for this basename: LABPROT, INR,  in the last 72 hours   Recent Labs Lab 10/12/12 0417 10/13/12 0426 10/14/12 0428 10/15/12 0505 10/16/12 0505  AST 81* 43* 110* 217* 62*  ALT 115* 85* 164* 219* 155*  ALKPHOS 118* 109 172* 211* 197*  BILITOT 0.8 0.8 1.0 1.3* 0.8  PROT 6.6 6.9 6.9 6.6 7.1  ALBUMIN 3.1* 3.1* 3.1* 3.1* 3.3*     Lipase     Component Value Date/Time   LIPASE 33 10/11/2012 0755     Studies/Results: Nm Hepatobiliary  10/15/2012  *RADIOLOGY REPORT*  NUCLEAR MEDICINE HEPATOHBILIARY INCLUDE GB  Clinical data:  Elevated liver enzymes post cholecystectomy.  Views:  Anterior right upper quadrant  Radiopharmaceutical: Technetium 72m Choletec  Dose:  5.7 mCi  Route of administration:  Intravenous  Findings: Liver uptake of radiotracer is normal.  Gallbladder is known to be absent.  There is prompt  visualization of small bowel, consistent with patency of the common bile duct.  There is no ectopic radiotracer to suggest bile leak.  IMPRESSION: No evidence suggesting bile leak.  Common bile duct patent. Gallbladder absent.   Original Report Authenticated By: Bretta Bang, M.D.     Medications: . polyethylene glycol  17 g Oral Daily  . sodium chloride  3 mL Intravenous Q12H    Assessment/Plan Calculus of gallbladder with other cholecystitis, without mention of obstruction S/p Laparoscopic Cholecystectomy with IOC,  10/13/2012, Shelly Rubenstein, MD.  Severe uterine bleeding with profound anemia, s/p transfused 3  Units PRBC yesterday.   Plan:  HIDA yesterday showed no bile leak, Ok to discharge home today, has f/u with a primary care MD in the next 7 days to monitor anemia.  Will need follow up with [place] in 2-3 weeks.       LOS: 5 days    Nancy [name] 10/16/2012

## 2012-10-16 NOTE — Progress Notes (Signed)
HIDA  negative for obstruction or leak.  OK to discharge from surgery standpoint.

## 2012-10-22 ENCOUNTER — Emergency Department (HOSPITAL_COMMUNITY)
Admission: EM | Admit: 2012-10-22 | Discharge: 2012-10-22 | Disposition: A | Discharge status: Home Health | Payer: Self-pay | Source: Home / Self Care

## 2012-10-22 ENCOUNTER — Encounter (HOSPITAL_COMMUNITY): Payer: Self-pay

## 2012-10-22 DIAGNOSIS — Z9889 Other specified postprocedural states: Secondary | ICD-10-CM

## 2012-10-22 DIAGNOSIS — Z9049 Acquired absence of other specified parts of digestive tract: Secondary | ICD-10-CM

## 2012-10-22 DIAGNOSIS — D649 Anemia, unspecified: Secondary | ICD-10-CM

## 2012-10-22 LAB — CBC
HCT: 33.5 % — ABNORMAL LOW (ref 36.0–46.0)
Hemoglobin: 10.8 g/dL — ABNORMAL LOW (ref 12.0–15.0)
MCH: 21.3 pg — ABNORMAL LOW (ref 26.0–34.0)
MCHC: 32.2 g/dL (ref 30.0–36.0)
MCV: 66.2 fL — ABNORMAL LOW (ref 78.0–100.0)
Platelets: 411 10*3/uL — ABNORMAL HIGH (ref 150–400)
RBC: 5.06 MIL/uL (ref 3.87–5.11)
WBC: 11.6 10*3/uL — ABNORMAL HIGH (ref 4.0–10.5)

## 2012-10-22 NOTE — ED Provider Notes (Signed)
History     CSN: [id number]  Arrival date & time 10/22/12  1604   None     No chief complaint on file.   32 year old female who is here for post hospital discharge followup. The patient had her laparoscopic cholecystectomy in 4/20. She has a followup appointment with Dr. Magnus Ivan on 5/6. The patient was also found to be profoundly anemic. Hemoglobin was 5.7 upon admission and after receiving 3 units of packed red blood cells her discharge hemoglobin was 8.8. She was started on ferrous sulfate supplementation and was recommended to have a gynecologic followup at [facility] clinic and has been on  She denies any fatigue and is recovering well from surgery   HPI  Past Medical History  Diagnosis Date  . Gallstones   . Anemia     Past Surgical History  Procedure Laterality Date  . Cesarean section    . Cholecystectomy N/A 10/13/2012    Procedure: LAPAROSCOPIC CHOLECYSTECTOMY WITH INTRAOPERATIVE CHOLANGIOGRAM;  Surgeon: Shelly Rubenstein, MD;  Location: [facility];  Service: General;  Laterality: N/A;    No family history on file.  History  Substance Use Topics  . Smoking status: Never Smoker   . Smokeless tobacco: Never Used  . Alcohol Use: No    OB History   Grav Para Term Preterm Abortions TAB SAB Ect Mult Living                  Review of Systems As in history of present illness  Allergies  Penicillins  Home Medications   Current Outpatient Rx  Name  Route  Sig  Dispense  Refill  . ferrous sulfate (FERROUSUL) 325 (65 FE) MG tablet   Oral   Take 1 tablet (325 mg total) by mouth daily with breakfast.   30 tablet   3   . oxyCODONE (OXY IR/ROXICODONE) 5 MG immediate release tablet   Oral   Take 1 tablet (5 mg total) by mouth every 4 (four) hours as needed.   30 tablet   0     LMP 10/04/2012  Physical Exam Cardiovascular: Normal rate, regular rhythm, normal heart sounds and intact distal pulses.  No murmur heard.  Respiratory: Effort normal and breath  sounds normal. No respiratory distress. She has no wheezes. She has no rales.  GI: Soft. Bowel sounds are normal. She exhibits no mass. There is tenderness. There is no rebound and no guarding.  Very mild tenderness in the right upper quadrant with minimal guarding.  Musculoskeletal: Normal range of motion. She exhibits no edema and no tenderness.  Lymphadenopathy:  She has no cervical adenopathy.  Neurological: She is alert and oriented to person, place, and time.  Skin: Skin is warm and dry. No rash noted. She is not diaphoretic. No erythema.  Psychiatric: Her behavior is normal. Judgment normal.   ED Course  Procedures (including critical care time)  Labs Reviewed  CBC   No results found.   No diagnosis found.    MDM  Status post cholecystectomy Follow up with surgery as scheduled recovering well  Anemia likely secondary to heavy menstrual bleeding Continue iron supplementation Will redraw CBC today Gynecologic followup   Followup in 3 months for a repeat anemia evaluation        Richarda Overlie, MD 10/22/12 1621

## 2012-10-22 NOTE — ED Notes (Signed)
Follow up Had gall bladder removed anemic

## 2012-10-29 ENCOUNTER — Encounter (INDEPENDENT_AMBULATORY_CARE_PROVIDER_SITE_OTHER): Payer: Self-pay | Admitting: Surgery

## 2012-10-29 ENCOUNTER — Ambulatory Visit (INDEPENDENT_AMBULATORY_CARE_PROVIDER_SITE_OTHER): Payer: Self-pay | Admitting: Surgery

## 2012-10-29 VITALS — BP 100/68 | HR 70 | Temp 98.2°F | Resp 16 | Wt 207.0 lb

## 2012-10-29 DIAGNOSIS — Z09 Encounter for follow-up examination after completed treatment for conditions other than malignant neoplasm: Secondary | ICD-10-CM

## 2012-10-29 NOTE — Progress Notes (Signed)
Subjective:     Patient ID: Nancy [name], female   DOB: [birth year], 32 y.o.   MRN: [id number]  HPI  She is here for her first postop visit status post laparoscopic cholecystectomy. She is doing well has no complaints. Review of Systems     Objective:   Physical Exam On exam, her incisions are healing well. The final pathology showed chronic cholecystitis with gallstones    Assessment:     Patient stable postop     Plan:     She may return to normal activity. I will see her back as needed

## 2012-11-01 ENCOUNTER — Encounter: Payer: Self-pay | Admitting: Obstetrics & Gynecology

## 2012-11-19 ENCOUNTER — Ambulatory Visit: Payer: Self-pay

## 2012-11-25 ENCOUNTER — Encounter: Payer: Self-pay | Admitting: Obstetrics and Gynecology

## 2012-12-05 ENCOUNTER — Ambulatory Visit: Payer: No Typology Code available for payment source | Attending: Family Medicine | Admitting: Internal Medicine

## 2012-12-05 VITALS — BP 120/81 | HR 83 | Temp 97.7°F | Resp 16 | Ht 62.0 in | Wt 215.0 lb

## 2012-12-05 DIAGNOSIS — D649 Anemia, unspecified: Secondary | ICD-10-CM

## 2012-12-05 DIAGNOSIS — D509 Iron deficiency anemia, unspecified: Secondary | ICD-10-CM | POA: Insufficient documentation

## 2012-12-05 DIAGNOSIS — N920 Excessive and frequent menstruation with regular cycle: Secondary | ICD-10-CM | POA: Insufficient documentation

## 2012-12-05 LAB — COMPREHENSIVE METABOLIC PANEL
ALT: 40 U/L — ABNORMAL HIGH (ref 0–35)
AST: 35 U/L (ref 0–37)
Albumin: 4.7 g/dL (ref 3.5–5.2)
Alkaline Phosphatase: 94 U/L (ref 39–117)
BUN: 13 mg/dL (ref 6–23)
CO2: 24 mEq/L (ref 19–32)
Calcium: 9.5 mg/dL (ref 8.4–10.5)
Chloride: 103 mEq/L (ref 96–112)
Creat: 0.54 mg/dL (ref 0.50–1.10)
Glucose, Bld: 84 mg/dL (ref 70–99)
Potassium: 4.1 mEq/L (ref 3.5–5.3)
Sodium: 138 mEq/L (ref 135–145)
Total Bilirubin: 0.5 mg/dL (ref 0.3–1.2)
Total Protein: 8.2 g/dL (ref 6.0–8.3)

## 2012-12-05 LAB — TSH: TSH: 2.166 u[IU]/mL (ref 0.350–4.500)

## 2012-12-05 MED ORDER — CLONAZEPAM 0.5 MG PO TABS: 0.5000 mg | ORAL_TABLET | Freq: Every day | ORAL | Status: DC

## 2012-12-05 NOTE — Progress Notes (Signed)
Patient complains of pain on right side after gallbladder removal; States she feels constipated and has pain when trying to have bowel movement. Pt want to know if everything is ok or does she need to wait longer for more healing. Patient states she had surgery exactly 6 weeks ago.

## 2012-12-05 NOTE — Addendum Note (Signed)
Addended by: Leroy Sea on: 12/05/2012 12:04 PM   Modules accepted: Orders

## 2012-12-05 NOTE — Progress Notes (Addendum)
Patient ID: Nancy [name], female   DOB: [birth year], 32 y.o.   MRN: [id number]  Patient Demographics  Nancy [name], is a 32 y.o. female  JYN:[id number]  [id number]  DOB - 1980-08-18  Chief Complaint  Patient presents with  . Establish Care        Subjective:   Nancy [name] today has, No headache, No chest pain, No abdominal pain - No Nausea, No new weakness tingling or numbness, No Cough - SOB.   She recently had a laparoscopic cholecystectomy, also has history of iron deficiency anemia due to menorrhagia, since her last hospital visit and clinic visit she continues to have some generalized weakness and says from time to time she thinks that she has a sensation that her heart is racing.  Objective:   Past Medical History  Diagnosis Date  . Gallstones   . Anemia       Past Surgical History  Procedure Laterality Date  . Cesarean section    . Cholecystectomy N/A 10/13/2012    Procedure: LAPAROSCOPIC CHOLECYSTECTOMY WITH INTRAOPERATIVE CHOLANGIOGRAM;  Surgeon: Shelly Rubenstein, MD;  Location: [facility];  Service: General;  Laterality: N/A;     Filed Vitals:   12/05/12 1125  BP: 120/81  Pulse: 83  Temp: 97.7 F (36.5 C)  TempSrc: Oral  Resp: 16  Height: 5\' 2"  (1.575 m)  Weight: 215 lb (97.523 kg)  SpO2: 100%     Exam  Awake Alert, Oriented X 3, No new F.N deficits, Normal affect [place].AT,PERRAL Supple Neck,No JVD, No cervical lymphadenopathy appriciated.  Symmetrical Chest wall movement, Good air movement bilaterally, CTAB RRR,No Gallops,Rubs or new Murmurs, No Parasternal Heave +ve B.Sounds, Abd Soft, Non tender, No organomegaly appriciated, No rebound - guarding or rigidity. No Cyanosis, Clubbing or edema, No new Rash or bruise       Data Review   CBC No results found for this basename: WBC, HGB, HCT, PLT, MCV, MCH, MCHC, RDW, NEUTRABS, LYMPHSABS, MONOABS, EOSABS, BASOSABS, BANDABS, BANDSABD,  in the last 168 hours  Chemistries   No results found  for this basename: NA, K, CL, CO2, GLUCOSE, BUN, CREATININE, GFRCGP, CALCIUM, MG, AST, ALT, ALKPHOS, BILITOT,  in the last 168 hours ------------------------------------------------------------------------------------------------------------------ No results found for this basename: HGBA1C,  in the last 72 hours ------------------------------------------------------------------------------------------------------------------ No results found for this basename: CHOL, HDL, LDLCALC, TRIG, CHOLHDL, LDLDIRECT,  in the last 72 hours ------------------------------------------------------------------------------------------------------------------ No results found for this basename: TSH, T4TOTAL, FREET3, T3FREE, THYROIDAB,  in the last 72 hours ------------------------------------------------------------------------------------------------------------------ No results found for this basename: VITAMINB12, FOLATE, FERRITIN, TIBC, IRON, RETICCTPCT,  in the last 72 hours  Coagulation profile  No results found for this basename: INR, PROTIME,  in the last 168 hours     Prior to Admission medications   Medication Sig Start Date End Date Taking? Authorizing Provider  acetaminophen (TYLENOL) 325 MG tablet Take 650 mg by mouth every 6 (six) hours as needed for pain.   Yes Historical Provider, MD  ferrous sulfate (FERROUSUL) 325 (65 FE) MG tablet Take 1 tablet (325 mg total) by mouth daily with breakfast. 10/16/12  Yes Meredeth Ide, MD  oxyCODONE (OXY IR/ROXICODONE) 5 MG immediate release tablet Take 1 tablet (5 mg total) by mouth every 4 (four) hours as needed. 10/16/12   Meredeth Ide, MD     Assessment & Plan   Recent cholecystectomy with elevated liver enzymes which were trending down.- Down exam is unremarkable, she has no right upper quadrant tenderness, we'll check a  CMP to ensure stability of her liver enzymes, note to me she never complained of any abdominal pain or discomfort whatsoever.   Iron  deficiency anemia due to menorrhagia-he has a pending appointment with GYN, she is on iron supplementation, last H&H was stable, will repeat a CBC along with TSH and anemia panel.   Weakness, feeling of heart racing.- Does not have any toxic appearance, no murmurs gallops or tachycardia on exam, 12-lead EKG -is stable NSR, and unchanged from Aug 2010, she has history of intermittent anxiety I will place her on low-dose Klonopin and refer her for psychiatry evaluation.       Leroy Sea M.D on 12/05/2012 at 11:44 AM

## 2012-12-06 LAB — URINALYSIS W MICROSCOPIC + REFLEX CULTURE
Bilirubin Urine: NEGATIVE
Casts: NONE SEEN
Crystals: NONE SEEN
Glucose, UA: NEGATIVE mg/dL
Ketones, ur: NEGATIVE mg/dL
Nitrite: NEGATIVE
Protein, ur: 30 mg/dL — AB
RBC / HPF: >50 RBC/hpf — AB
Specific Gravity, Urine: 1.02 (ref 1.005–1.030)
Urobilinogen, UA: 0.2 mg/dL (ref 0.0–1.0)
pH: 5.5 (ref 5.0–8.0)

## 2012-12-06 LAB — CBC
HCT: 38.6 % (ref 36.0–46.0)
Hemoglobin: 12.6 g/dL (ref 12.0–15.0)
MCH: 25.2 pg — ABNORMAL LOW (ref 26.0–34.0)
MCHC: 32.6 g/dL (ref 30.0–36.0)
MCV: 77.2 fL — ABNORMAL LOW (ref 78.0–100.0)
Platelets: 349 10*3/uL (ref 150–400)
RBC: 5 MIL/uL (ref 3.87–5.11)
WBC: 10.5 10*3/uL (ref 4.0–10.5)

## 2012-12-06 LAB — ANEMIA PANEL
%SAT: 9 % — ABNORMAL LOW (ref 20–55)
ABS Retic: 80 K/uL (ref 19.0–186.0)
Ferritin: 32 ng/mL (ref 10–291)
Folate: >20 ng/mL
Iron: 27 ug/dL — ABNORMAL LOW (ref 42–145)
RBC.: 5 MIL/uL (ref 3.87–5.11)
Retic Ct Pct: 1.6 % (ref 0.4–2.3)
TIBC: 310 ug/dL (ref 250–470)
UIBC: 283 ug/dL (ref 125–400)
Vitamin B-12: 912 pg/mL — ABNORMAL HIGH (ref 211–911)

## 2012-12-06 NOTE — Progress Notes (Signed)
Quick Note:  Call patient to return for hematuria, repeat UA and Urology follow up ______

## 2012-12-07 LAB — URINE CULTURE: Colony Count: 5000

## 2012-12-09 NOTE — Progress Notes (Signed)
Quick Note:  Call pt to come back for urology referral for recurrent hematuria ______

## 2012-12-10 ENCOUNTER — Telehealth: Payer: Self-pay | Admitting: *Deleted

## 2012-12-11 ENCOUNTER — Telehealth: Payer: Self-pay | Admitting: *Deleted

## 2012-12-11 NOTE — Telephone Encounter (Signed)
12/11/12 Attempt to reach patient no answer at the number provided Nancy [name]

## 2012-12-13 ENCOUNTER — Ambulatory Visit: Payer: No Typology Code available for payment source

## 2012-12-17 ENCOUNTER — Telehealth: Payer: Self-pay | Admitting: *Deleted

## 2012-12-17 NOTE — Telephone Encounter (Signed)
12/17/12 Unable to reach patient at number provided.  P.Oluwaseyi Tull,RN BSN [facility] 

## 2013-01-17 ENCOUNTER — Telehealth: Payer: Self-pay | Admitting: Family Medicine

## 2013-01-17 NOTE — Telephone Encounter (Signed)
Pt calling about lab results for 12/05/12 office visit, spanish speaker.

## 2013-01-17 NOTE — Telephone Encounter (Signed)
Pt returning phone call; pt did not know if the call was for her or her father

## 2013-01-21 ENCOUNTER — Telehealth: Payer: Self-pay | Admitting: *Deleted

## 2013-05-01 ENCOUNTER — Telehealth: Payer: Self-pay | Admitting: Internal Medicine

## 2013-05-01 NOTE — Telephone Encounter (Signed)
Pt given lab results. Will need to return for repeat UA.verbalized understanding

## 2013-05-01 NOTE — Telephone Encounter (Signed)
Pt has not received lab results from several months ago, please f/u with pt.

## 2013-05-19 ENCOUNTER — Ambulatory Visit: Payer: No Typology Code available for payment source

## 2013-05-20 ENCOUNTER — Ambulatory Visit: Payer: No Typology Code available for payment source | Attending: Internal Medicine | Admitting: Internal Medicine

## 2013-05-20 ENCOUNTER — Encounter: Payer: Self-pay | Admitting: Internal Medicine

## 2013-05-20 VITALS — BP 95/65 | HR 101 | Temp 99.3°F | Resp 18 | Ht 62.0 in | Wt 240.0 lb

## 2013-05-20 DIAGNOSIS — N920 Excessive and frequent menstruation with regular cycle: Secondary | ICD-10-CM | POA: Insufficient documentation

## 2013-05-20 DIAGNOSIS — R42 Dizziness and giddiness: Secondary | ICD-10-CM

## 2013-05-20 DIAGNOSIS — R109 Unspecified abdominal pain: Secondary | ICD-10-CM

## 2013-05-20 DIAGNOSIS — D509 Iron deficiency anemia, unspecified: Secondary | ICD-10-CM | POA: Insufficient documentation

## 2013-05-20 LAB — POCT URINE PREGNANCY: Preg Test, Ur: NEGATIVE

## 2013-05-20 LAB — COMPLETE METABOLIC PANEL WITH GFR
ALT: 14 U/L (ref 0–35)
AST: 18 U/L (ref 0–37)
Albumin: 4.1 g/dL (ref 3.5–5.2)
Alkaline Phosphatase: 66 U/L (ref 39–117)
BUN: 16 mg/dL (ref 6–23)
CO2: 23 mEq/L (ref 19–32)
Calcium: 9 mg/dL (ref 8.4–10.5)
Chloride: 104 mEq/L (ref 96–112)
Creat: 0.54 mg/dL (ref 0.50–1.10)
GFR, Est African American: >89 mL/min
GFR, Est Non African American: >89 mL/min
Glucose, Bld: 100 mg/dL — ABNORMAL HIGH (ref 70–99)
Potassium: 4.1 mEq/L (ref 3.5–5.3)
Sodium: 136 mEq/L (ref 135–145)
Total Bilirubin: 0.6 mg/dL (ref 0.3–1.2)
Total Protein: 7.3 g/dL (ref 6.0–8.3)

## 2013-05-20 LAB — CBC WITH DIFFERENTIAL/PLATELET
Basophils Absolute: 0 10*3/uL (ref 0.0–0.1)
Basophils Relative: 0 % (ref 0–1)
Eosinophils Absolute: 0.3 10*3/uL (ref 0.0–0.7)
Eosinophils Relative: 3 % (ref 0–5)
HCT: 36.2 % (ref 36.0–46.0)
Hemoglobin: 12 g/dL (ref 12.0–15.0)
Lymphocytes Relative: 14 % (ref 12–46)
Lymphs Abs: 1.5 10*3/uL (ref 0.7–4.0)
MCH: 27.9 pg (ref 26.0–34.0)
MCHC: 33.1 g/dL (ref 30.0–36.0)
MCV: 84.2 fL (ref 78.0–100.0)
Monocytes Absolute: 0.8 10*3/uL (ref 0.1–1.0)
Monocytes Relative: 8 % (ref 3–12)
Neutro Abs: 8.2 10*3/uL — ABNORMAL HIGH (ref 1.7–7.7)
Neutrophils Relative %: 75 % (ref 43–77)
Platelets: 315 10*3/uL (ref 150–400)
RBC: 4.3 MIL/uL (ref 3.87–5.11)
RDW: 14.7 % (ref 11.5–15.5)
WBC: 10.8 10*3/uL — ABNORMAL HIGH (ref 4.0–10.5)

## 2013-05-20 LAB — GLUCOSE, POCT (MANUAL RESULT ENTRY): POC Glucose: 103 mg/dl — AB (ref 70–99)

## 2013-05-20 LAB — HEMOGLOBIN A1C
Hgb A1c MFr Bld: 5.5 % (ref <5.7)
Mean Plasma Glucose: 111 mg/dL (ref <117)

## 2013-05-20 MED ORDER — FERROUS SULFATE 325 (65 FE) MG PO TABS: 325.0000 mg | ORAL_TABLET | Freq: Every day | ORAL | Status: DC

## 2013-05-20 NOTE — Progress Notes (Signed)
Pt here today with complaints dizziness with poor appetite x 2 mnths intermit C/o feeling cold at times. Pt was in hospital 5 mnths ago for anemia. Finished taking iron pills orthostats negative preg test negative

## 2013-05-20 NOTE — Progress Notes (Signed)
Patient ID: Nancy [name], female   DOB: 1981-04-09, 32 y.o.   MRN: [id number]   CC:  HPI: 32 year old female is here for evaluation of her anemia. She complains of feeling dizzy, lightheaded, cold in her hands, she has not been taking her iron pills. 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 Penicillins Swelling and Rash   Past Medical History  Diagnosis Date  . Gallstones   . Anemia    Current Outpatient Prescriptions on File Prior to Visit  Medication Sig Dispense Refill  . acetaminophen (TYLENOL) 325 MG tablet Take 650 mg by mouth every 6 (six) hours as needed for pain.      . clonazePAM (KLONOPIN) 0.5 MG tablet Take 1 tablet (0.5 mg total) by mouth at bedtime.  30 tablet  1  . oxyCODONE (OXY IR/ROXICODONE) 5 MG immediate release tablet Take 1 tablet (5 mg total) by mouth every 4 (four) hours as needed.  30 tablet  0   No current facility-administered medications on file prior to visit.   History reviewed. No pertinent family history. History   Social History  . Marital Status: Married    Spouse Name: N/A    Number of Children: N/A  . Years of Education: N/A   Occupational History  . Not on file.   Social History Main Topics  . Smoking status: Never Smoker   . Smokeless tobacco: Never Used  . Alcohol Use: No  . Drug Use: No  . Sexual Activity: Not on file   Other Topics Concern  . Not on file   Social History Narrative  . No narrative on file    Review of Systems   Constitutional: Negative for fever, chills, diaphoresis, activity change, appetite change and fatigue.  HENT: Negative for ear pain, nosebleeds, congestion, facial swelling, rhinorrhea, neck pain, neck stiffness and ear discharge.   Eyes: Negative for pain, discharge, redness, itching and visual disturbance.  Respiratory: Negative for cough, choking, chest tightness, shortness of breath, wheezing and stridor.   Cardiovascular: Negative for chest pain, palpitations and leg swelling.  Gastrointestinal: Negative for abdominal distention.  Genitourinary: Negative for dysuria, urgency, frequency, hematuria, flank pain, decreased urine volume, difficulty urinating and dyspareunia.  Musculoskeletal: Negative for back pain, joint swelling, arthralgias and gait problem.  Neurological: Negative for dizziness, tremors, seizures, syncope, facial asymmetry, speech difficulty, weakness, light-headedness, numbness and headaches.  Hematological: Negative for adenopathy. Does not bruise/bleed easily.  Psychiatric/Behavioral: Negative for hallucinations, behavioral problems, confusion, dysphoric mood, decreased concentration and agitation.    Objective:   Filed Vitals:   05/20/13 1238  BP: 95/65  Pulse: 101  Temp: 99.3 F (37.4 C)  Resp: 18    Physical Exam  Constitutional: Appears well-developed and well-nourished. No distress.  HENT: Normocephalic. External right and left ear normal. Oropharynx is clear and moist.  Eyes: Conjunctivae and EOM are normal. PERRLA, no scleral icterus.  Neck: Normal ROM. Neck supple. No JVD. No tracheal  deviation. No thyromegaly.  CVS: RRR, S1/S2 +, no murmurs, no gallops, no carotid bruit.  Pulmonary: Effort and breath sounds normal, no stridor, rhonchi, wheezes, rales.  Abdominal: Soft. BS +,  no distension, tenderness, rebound or guarding.  Musculoskeletal: Normal range of motion. No edema and no tenderness.  Lymphadenopathy: No lymphadenopathy noted, cervical,  inguinal. Neuro: Alert. Normal reflexes, muscle tone coordination. No cranial nerve deficit. Skin: Skin is warm and dry. No rash noted. Not diaphoretic. No erythema. No pallor.  Psychiatric: Normal mood and affect. Behavior, judgment, thought content normal.   Lab Results  Component Value Date   WBC 10.5 12/05/2012   HGB 12.6 12/05/2012   HCT 38.6 12/05/2012   MCV 77.2* 12/05/2012   PLT 349 12/05/2012   Lab Results  Component Value Date   CREATININE 0.54 12/05/2012   BUN 13 12/05/2012   NA 138 12/05/2012   K 4.1 12/05/2012   CL 103 12/05/2012   CO2 24 12/05/2012    No results found for this basename: HGBA1C   Lipid Panel  No results found for this basename: chol, trig, hdl, cholhdl, vldl, ldlcalc       Assessment and plan:   Patient Active Problem List   Diagnosis Date Noted  . RUQ abdominal pain 10/11/2012  . Elevated LFTs 10/11/2012  . Anemia 10/11/2012  . Cholelithiasis 10/11/2012  . OBESITY, NOS 08/23/2006  . ANEMIA, OTHER, UNSPECIFIED 08/23/2006  . PANIC ATTACKS 08/23/2006  . NEPHROLITHIASIS, HX OF 08/23/2006       Menorrhagia likely the cause of iron deficiency anemia Iron studies were done during her last visit the patient had a very low iron saturation Patient has been recommended to restart iron Pelvic ultrasound for abnormal bleeding Urine pregnancy test Check TSH, Gynecology referral for abnormal uterine bleeding Followup in one month  Dietary consultation for anemia  The patient was given clear instructions to go to ER or return to medical center if symptoms don't improve, worsen or new problems develop. The patient verbalized understanding. The patient was told to call to get any lab results if not heard anything in the next week.

## 2013-05-21 LAB — PROLACTIN: Prolactin: 7.6 ng/mL

## 2013-05-21 LAB — TSH: TSH: 1.108 u[IU]/mL (ref 0.350–4.500)

## 2013-05-23 ENCOUNTER — Ambulatory Visit (HOSPITAL_COMMUNITY): Payer: No Typology Code available for payment source

## 2013-05-30 ENCOUNTER — Ambulatory Visit (HOSPITAL_COMMUNITY)
Admission: RE | Admit: 2013-05-30 | Discharge: 2013-05-30 | Disposition: A | Discharge status: Home / Self Care | Payer: Self-pay | Source: Ambulatory Visit | Attending: Internal Medicine | Admitting: Internal Medicine

## 2013-05-30 ENCOUNTER — Ambulatory Visit (HOSPITAL_COMMUNITY): Payer: Self-pay

## 2013-05-30 DIAGNOSIS — R42 Dizziness and giddiness: Secondary | ICD-10-CM

## 2013-05-30 DIAGNOSIS — N898 Other specified noninflammatory disorders of vagina: Secondary | ICD-10-CM | POA: Insufficient documentation

## 2013-05-30 DIAGNOSIS — N854 Malposition of uterus: Secondary | ICD-10-CM | POA: Insufficient documentation

## 2013-05-30 DIAGNOSIS — R109 Unspecified abdominal pain: Secondary | ICD-10-CM

## 2013-06-04 ENCOUNTER — Ambulatory Visit: Payer: Self-pay

## 2013-06-06 ENCOUNTER — Ambulatory Visit: Payer: Self-pay | Attending: Internal Medicine

## 2013-06-06 ENCOUNTER — Other Ambulatory Visit: Payer: Self-pay | Admitting: Emergency Medicine

## 2013-06-06 MED ORDER — AZITHROMYCIN 250 MG PO TABS: 500.0000 mg | ORAL_TABLET | Freq: Every day | ORAL | Status: DC

## 2013-06-06 MED ORDER — ACETAMINOPHEN 325 MG PO TABS: 650.0000 mg | ORAL_TABLET | Freq: Four times a day (QID) | ORAL | Status: DC | PRN

## 2013-06-06 NOTE — Patient Instructions (Signed)
Pt given Z-pak to take 2 tabs 1st day then one tab x 4 dys. Drink plenty of fluids and return on Monday if no improvement

## 2013-06-06 NOTE — Progress Notes (Signed)
Pt comes in with c/o 2 weeks cold sx's unrelieved by OTC Nyquil and tylenol. States she feels dizzy today. Vss. afebrile

## 2013-06-07 ENCOUNTER — Telehealth: Payer: Self-pay | Admitting: Emergency Medicine

## 2013-06-07 NOTE — Telephone Encounter (Signed)
Both number listed has no voicemail set up. Attempted to leave message to clarify pt prescribed ATB Z-pak instructions. No answer

## 2013-06-12 ENCOUNTER — Ambulatory Visit: Payer: Self-pay | Attending: Internal Medicine | Admitting: Internal Medicine

## 2013-06-12 VITALS — BP 111/79 | HR 76 | Temp 98.7°F | Resp 14 | Ht 62.0 in | Wt 248.4 lb

## 2013-06-12 DIAGNOSIS — H8110 Benign paroxysmal vertigo, unspecified ear: Secondary | ICD-10-CM

## 2013-06-12 DIAGNOSIS — I10 Essential (primary) hypertension: Secondary | ICD-10-CM | POA: Insufficient documentation

## 2013-06-12 MED ORDER — CETIRIZINE HCL 10 MG PO TABS: 10.0000 mg | ORAL_TABLET | Freq: Every day | ORAL | Status: DC

## 2013-06-12 MED ORDER — FLUTICASONE PROPIONATE 50 MCG/ACT NA SUSP: 2.0000 | Freq: Every day | NASAL | Status: DC

## 2013-06-12 MED ORDER — MECLIZINE HCL 12.5 MG PO TABS: 12.5000 mg | ORAL_TABLET | Freq: Three times a day (TID) | ORAL | Status: DC | PRN

## 2013-06-12 NOTE — Progress Notes (Signed)
Pt is here because she's been feeling dizzy x1 week and 1 day. Recently had an infection in her throat. Feels like the room is spinning; cold is gone but dizziness is constant. Worsens while movement.

## 2013-06-12 NOTE — Progress Notes (Signed)
Patient ID: Nancy [name], female   DOB: July 18, 1980, 32 y.o.   MRN: [id number]   CC:  HPI: History-year-old female here with complaints of dizziness. The patient states that her dizziness is mostly when she wakes up in the morning at 3 AM. No dizziness during the day it is positional when she is bending or turning her head. She was recently treated for a viral URI She was also evaluated for abnormal vaginal bleeding. Had a pelvic ultrasound that showed no fibroid or masses. This was communicated to the patient. The patient also states that she is not getting enough sleep. She was provided with a gynecologic referral but she has not seen a gynecologist yet    Allergies  Allergen Reactions  . Penicillins Swelling and Rash   Past Medical History  Diagnosis Date  . Gallstones   . Anemia    Current Outpatient Prescriptions on File Prior to Visit  Medication Sig Dispense Refill  . acetaminophen (TYLENOL) 325 MG tablet Take 2 tablets (650 mg total) by mouth every 6 (six) hours as needed.  30 tablet  1  . clonazePAM (KLONOPIN) 0.5 MG tablet Take 1 tablet (0.5 mg total) by mouth at bedtime.  30 tablet  1  . ferrous sulfate (FERROUSUL) 325 (65 FE) MG tablet Take 1 tablet (325 mg total) by mouth daily with breakfast.  90 tablet  3  . oxyCODONE (OXY IR/ROXICODONE) 5 MG immediate release tablet Take 1 tablet (5 mg total) by mouth every 4 (four) hours as needed.  30 tablet  0   No current facility-administered medications on file prior to visit.   No family history on file. History   Social History  . Marital Status: Married    Spouse Name: N/A    Number of Children: N/A  . Years of Education: N/A   Occupational History  . Not on file.   Social History Main Topics  . Smoking status: Never Smoker   . Smokeless tobacco: Never Used  . Alcohol Use: No  . Drug Use: No  . Sexual Activity: Not on file   Other Topics Concern  . Not on file   Social History Narrative  . No narrative  on file    Review of Systems  Constitutional: Negative for fever, chills, diaphoresis, activity change, appetite change and fatigue.  HENT: Negative for ear pain, nosebleeds, congestion, facial swelling, rhinorrhea, neck pain, neck stiffness and ear discharge.   Eyes: Negative for pain, discharge, redness, itching and visual disturbance.  Respiratory: Negative for cough, choking, chest tightness, shortness of breath, wheezing and stridor.   Cardiovascular: Negative for chest pain, palpitations and leg swelling.  Gastrointestinal: Negative for abdominal distention.  Genitourinary: Negative for dysuria, urgency, frequency, hematuria, flank pain, decreased urine volume, difficulty urinating and dyspareunia.  Musculoskeletal: Negative for back pain, joint swelling, arthralgias and gait problem.  Neurological: Negative for dizziness, tremors, seizures, syncope, facial asymmetry, speech difficulty, weakness, light-headedness, numbness and headaches.  Hematological: Negative for adenopathy. Does not bruise/bleed easily.  Psychiatric/Behavioral: Negative for hallucinations, behavioral problems, confusion, dysphoric mood, decreased concentration and agitation.    Objective:   Filed Vitals:   06/12/13 1014  BP: 135/83  Pulse: 76  Temp: 98.7 F (37.1 C)  Resp: 14    Physical Exam  Constitutional: Appears well-developed and well-nourished. No distress.  HENT: Normocephalic. External right and left ear normal. Oropharynx is clear and moist.  Eyes: Conjunctivae and EOM are normal. PERRLA, no scleral icterus.  Neck: Normal ROM. Neck  supple. No JVD. No tracheal deviation. No thyromegaly.  CVS: RRR, S1/S2 +, no murmurs, no gallops, no carotid bruit.  Pulmonary: Effort and breath sounds normal, no stridor, rhonchi, wheezes, rales.  Abdominal: Soft. BS +,  no distension, tenderness, rebound or guarding.  Musculoskeletal: Normal range of motion. No edema and no tenderness.  Lymphadenopathy: No  lymphadenopathy noted, cervical, inguinal. Neuro: Alert. Normal reflexes, muscle tone coordination. No cranial nerve deficit. Skin: Skin is warm and dry. No rash noted. Not diaphoretic. No erythema. No pallor.  Psychiatric: Normal mood and affect. Behavior, judgment, thought content normal.   Lab Results  Component Value Date   WBC 10.8* 05/20/2013   HGB 12.0 05/20/2013   HCT 36.2 05/20/2013   MCV 84.2 05/20/2013   PLT 315 05/20/2013   Lab Results  Component Value Date   CREATININE 0.54 05/20/2013   BUN 16 05/20/2013   NA 136 05/20/2013   K 4.1 05/20/2013   CL 104 05/20/2013   CO2 23 05/20/2013    Lab Results  Component Value Date   HGBA1C 5.5 05/20/2013   Lipid Panel  No results found for this basename: chol, trig, hdl, cholhdl, vldl, ldlcalc       Assessment and plan:   Patient Active Problem List   Diagnosis Date Noted  . RUQ abdominal pain 10/11/2012  . Elevated LFTs 10/11/2012  . Anemia 10/11/2012  . Cholelithiasis 10/11/2012  . OBESITY, NOS 08/23/2006  . ANEMIA, OTHER, UNSPECIFIED 08/23/2006  . PANIC ATTACKS 08/23/2006  . NEPHROLITHIASIS, HX OF 08/23/2006   Benign positional vertigo Could be a sequelae of a recent viral infection I also feel that the patient is not getting enough sleep She is being prescribed Flonase, meclizine, zyrtec I have  encouraged fluid hydration  If no improvement the patient may need an MRI of the brain on her next visit  Hypertension Advised to lose weight patient is morbidly obese  Menorrhagia GYN referral has been provided  Follow up in 3 months  The patient was given clear instructions to go to ER or return to medical center if symptoms don't improve, worsen or new problems develop. The patient verbalized understanding. The patient was told to call to get any lab results if not heard anything in the next week.

## 2013-06-17 ENCOUNTER — Ambulatory Visit: Payer: Self-pay | Admitting: Internal Medicine

## 2013-07-24 ENCOUNTER — Ambulatory Visit: Payer: Self-pay | Admitting: Dietician

## 2013-08-08 ENCOUNTER — Encounter: Payer: Self-pay | Admitting: Internal Medicine

## 2013-08-08 ENCOUNTER — Ambulatory Visit: Payer: No Typology Code available for payment source | Attending: Internal Medicine | Admitting: Internal Medicine

## 2013-08-08 VITALS — BP 128/80 | HR 82 | Temp 98.7°F | Resp 16 | Ht 60.5 in | Wt 258.0 lb

## 2013-08-08 DIAGNOSIS — Z79899 Other long term (current) drug therapy: Secondary | ICD-10-CM | POA: Insufficient documentation

## 2013-08-08 DIAGNOSIS — J029 Acute pharyngitis, unspecified: Secondary | ICD-10-CM | POA: Insufficient documentation

## 2013-08-08 DIAGNOSIS — R059 Cough, unspecified: Secondary | ICD-10-CM | POA: Insufficient documentation

## 2013-08-08 DIAGNOSIS — R05 Cough: Secondary | ICD-10-CM

## 2013-08-08 MED ORDER — SULFAMETHOXAZOLE-TMP DS 800-160 MG PO TABS
1.0000 | ORAL_TABLET | Freq: Two times a day (BID) | ORAL | Status: DC
Start: 2013-08-08 — End: 2014-01-16

## 2013-08-08 MED ORDER — BENZONATATE 100 MG PO CAPS: 100.0000 mg | ORAL_CAPSULE | Freq: Three times a day (TID) | ORAL | Status: DC | PRN

## 2013-08-08 NOTE — Progress Notes (Signed)
Pt is here having flu like symptoms for 10 days

## 2013-08-08 NOTE — Progress Notes (Signed)
MRN: [id number] Name: Nancy BarerDeisy B Zamarripa  Sex: female Age: 33 y.o. DOB: [birth year]  Allergies: Penicillins  Chief Complaint  Patient presents with  . Follow-up    HPI: Patient is 33 y.o. female who comes in reported to have sore throat for the last 3-4 days also had some nasal congestion left ear pain productive cough yellowish in color, denies any fever chills chest pain or shortness of breath, denies smoking cigarettes denies any nausea vomiting.  Past Medical History  Diagnosis Date  . Gallstones   . Anemia     Past Surgical History  Procedure Laterality Date  . Cesarean section    . Cholecystectomy N/A 10/13/2012    Procedure: LAPAROSCOPIC CHOLECYSTECTOMY WITH INTRAOPERATIVE CHOLANGIOGRAM;  Surgeon: Shelly Rubensteinouglas A Blackman, MD;  Location: [facility];  Service: General;  Laterality: N/A;      Medication List       This list is accurate as of: 08/08/13  4:46 PM.  Always use your most recent med list.               acetaminophen 325 MG tablet  Commonly known as:  TYLENOL  Take 2 tablets (650 mg total) by mouth every 6 (six) hours as needed.     benzonatate 100 MG capsule  Commonly known as:  TESSALON  Take 1 capsule (100 mg total) by mouth 3 (three) times daily as needed for cough.     cetirizine 10 MG tablet  Commonly known as:  ZYRTEC  Take 1 tablet (10 mg total) by mouth at bedtime.     clonazePAM 0.5 MG tablet  Commonly known as:  KLONOPIN  Take 1 tablet (0.5 mg total) by mouth at bedtime.     ferrous sulfate 325 (65 FE) MG tablet  Commonly known as:  FERROUSUL  Take 1 tablet (325 mg total) by mouth daily with breakfast.     fluticasone 50 MCG/ACT nasal spray  Commonly known as:  FLONASE  Place 2 sprays into both nostrils daily.     meclizine 12.5 MG tablet  Commonly known as:  ANTIVERT  Take 1 tablet (12.5 mg total) by mouth 3 (three) times daily as needed for dizziness.     oxyCODONE 5 MG immediate release tablet  Commonly known as:  Oxy IR/ROXICODONE    Take 1 tablet (5 mg total) by mouth every 4 (four) hours as needed.     sulfamethoxazole-trimethoprim 800-160 MG per tablet  Commonly known as:  BACTRIM DS  Take 1 tablet by mouth 2 (two) times daily.        Meds ordered this encounter  Medications  . benzonatate (TESSALON) 100 MG capsule    Sig: Take 1 capsule (100 mg total) by mouth 3 (three) times daily as needed for cough.    Dispense:  30 capsule    Refill:  1  . sulfamethoxazole-trimethoprim (BACTRIM DS) 800-160 MG per tablet    Sig: Take 1 tablet by mouth 2 (two) times daily.    Dispense:  20 tablet    Refill:  0     There is no immunization history on file for this patient.  History reviewed. No pertinent family history.  History  Substance Use Topics  . Smoking status: Never Smoker   . Smokeless tobacco: Never Used  . Alcohol Use: No    Review of Systems   As noted in HPI  Filed Vitals:   08/08/13 1607  BP: 128/80  Pulse: 82  Temp: 98.7 F (37.1 C)  Resp: 16    Physical Exam  Physical Exam  Constitutional: No distress.  HENT:  Minimal sinus congestion, pharyngeal erythema, enlarged tonsils no exudate   Eyes: EOM are normal. Pupils are equal, round, and reactive to light.  Cardiovascular: Normal rate and regular rhythm.   Pulmonary/Chest: Breath sounds normal. No respiratory distress. She has no wheezes. She has no rales.  Lymphadenopathy:    She has cervical adenopathy.    CBC    Component Value Date/Time   WBC 10.8* 05/20/2013 1301   RBC 4.30 05/20/2013 1301   RBC 5.00 12/05/2012 1144   HGB 12.0 05/20/2013 1301   HCT 36.2 05/20/2013 1301   PLT 315 05/20/2013 1301   MCV 84.2 05/20/2013 1301   LYMPHSABS 1.5 05/20/2013 1301   MONOABS 0.8 05/20/2013 1301   EOSABS 0.3 05/20/2013 1301   BASOSABS 0.0 05/20/2013 1301    CMP     Component Value Date/Time   NA 136 05/20/2013 1301   K 4.1 05/20/2013 1301   CL 104 05/20/2013 1301   CO2 23 05/20/2013 1301   GLUCOSE 100* 05/20/2013 1301    BUN 16 05/20/2013 1301   CREATININE 0.54 05/20/2013 1301   CREATININE 0.57 10/16/2012 0505   CALCIUM 9.0 05/20/2013 1301   PROT 7.3 05/20/2013 1301   ALBUMIN 4.1 05/20/2013 1301   AST 18 05/20/2013 1301   ALT 14 05/20/2013 1301   ALKPHOS 66 05/20/2013 1301   BILITOT 0.6 05/20/2013 1301   GFRNONAA >90 10/16/2012 0505   GFRAA >90 10/16/2012 0505    No results found for this basename: chol, tri, ldl    No components found with this basename: hga1c    Lab Results  Component Value Date/Time   AST 18 05/20/2013  1:01 PM    Assessment and Plan  Pharyngitis - Plan: sulfamethoxazole-trimethoprim (BACTRIM DS) 800-160 MG per tablet Advised patient for saltwater gargles  Cough - Plan: benzonatate (TESSALON) 100 MG capsule  Return if symptoms worsen or fail to improve.  Doris Cheadle, MD

## 2013-08-13 ENCOUNTER — Ambulatory Visit: Payer: Self-pay | Admitting: Dietician

## 2013-08-14 ENCOUNTER — Encounter: Payer: Self-pay | Admitting: Obstetrics & Gynecology

## 2013-09-10 ENCOUNTER — Ambulatory Visit: Payer: Self-pay | Admitting: Internal Medicine

## 2013-11-27 ENCOUNTER — Encounter: Payer: Self-pay | Admitting: Internal Medicine

## 2013-11-27 ENCOUNTER — Ambulatory Visit: Payer: No Typology Code available for payment source | Attending: Internal Medicine | Admitting: Internal Medicine

## 2013-11-27 VITALS — BP 108/64 | HR 65 | Temp 98.2°F | Resp 17 | Wt 264.4 lb

## 2013-11-27 DIAGNOSIS — D649 Anemia, unspecified: Secondary | ICD-10-CM

## 2013-11-27 DIAGNOSIS — N920 Excessive and frequent menstruation with regular cycle: Secondary | ICD-10-CM

## 2013-11-27 LAB — CBC WITH DIFFERENTIAL/PLATELET
Basophils Absolute: 0 10*3/uL (ref 0.0–0.1)
Basophils Relative: 0 % (ref 0–1)
Eosinophils Absolute: 0.5 10*3/uL (ref 0.0–0.7)
Eosinophils Relative: 5 % (ref 0–5)
HCT: 31.8 % — ABNORMAL LOW (ref 36.0–46.0)
Hemoglobin: 10.3 g/dL — ABNORMAL LOW (ref 12.0–15.0)
Lymphocytes Relative: 27 % (ref 12–46)
Lymphs Abs: 2.8 10*3/uL (ref 0.7–4.0)
MCH: 25.4 pg — ABNORMAL LOW (ref 26.0–34.0)
MCHC: 32.4 g/dL (ref 30.0–36.0)
MCV: 78.3 fL (ref 78.0–100.0)
Monocytes Absolute: 0.6 10*3/uL (ref 0.1–1.0)
Monocytes Relative: 6 % (ref 3–12)
Neutro Abs: 6.3 10*3/uL (ref 1.7–7.7)
Neutrophils Relative %: 62 % (ref 43–77)
Platelets: 326 10*3/uL (ref 150–400)
RBC: 4.06 MIL/uL (ref 3.87–5.11)
RDW: 16.7 % — ABNORMAL HIGH (ref 11.5–15.5)
WBC: 10.2 10*3/uL (ref 4.0–10.5)

## 2013-11-27 LAB — ANEMIA PANEL
%SAT: 11 % — ABNORMAL LOW (ref 20–55)
ABS Retic: 134 10*3/uL (ref 19.0–186.0)
Ferritin: 15 ng/mL (ref 10–291)
Folate: >20 ng/mL
Iron: 29 ug/dL — ABNORMAL LOW (ref 42–145)
RBC.: 4.06 MIL/uL (ref 3.87–5.11)
Retic Ct Pct: 3.3 % — ABNORMAL HIGH (ref 0.4–2.3)
TIBC: 269 ug/dL (ref 250–470)
UIBC: 240 ug/dL (ref 125–400)
Vitamin B-12: 540 pg/mL (ref 211–911)

## 2013-11-27 MED ORDER — ETONOGESTREL-ETHINYL ESTRADIOL 0.12-0.015 MG/24HR VA RING: VAGINAL_RING | VAGINAL | Status: DC

## 2013-11-27 MED ORDER — FERROUS SULFATE 325 (65 FE) MG PO TABS: 325.0000 mg | ORAL_TABLET | Freq: Three times a day (TID) | ORAL | Status: DC

## 2013-11-27 NOTE — Progress Notes (Signed)
Patient here with interpreter Complains of feeling dizzy Feels fatigued Has had her period for the past three weeks Was seen by her gynecologist and her results are supposed to be sent To our office

## 2013-11-27 NOTE — Progress Notes (Signed)
Patient ID: Nancy [name], female   DOB: 25-Mar-1981, [age] y.o.   MRN: [id number]  CC: prolonged bleeding, dizziness  HPI: Patient presents today for evaluation of fatigue and dizziness.  She reports that she has become increasingly dizzy, tired, and sleepy all day.  Has been on current menstrual cycle for three weeks. Last year had same issue and ended up in hospital for 1 week for severe anemia and required a blood transfusion.  She has a history of heavy cycles for prolonged periods of time. She goes through nine super pads per day and sometimes uses diapers, because she is afriad she will bleed through her clothes.  Has been seen by GYN but is unaware of why she is bleeding so much.  Four months ago she had a pelvic ultrasound which was normal. Has used Nuvaring and it helped with prolonged bleeding.  She reports only moderate cramps. Feels bloated, back pain.  Only takes one iron pill per day currently.  Allergies  Allergen Reactions  . Penicillins Swelling and Rash   Past Medical History  Diagnosis Date  . Gallstones   . Anemia    Current Outpatient Prescriptions on File Prior to Visit  Medication Sig Dispense Refill  . acetaminophen (TYLENOL) 325 MG tablet Take 2 tablets (650 mg total) by mouth every 6 (six) hours as needed.  30 tablet  1  . benzonatate (TESSALON) 100 MG capsule Take 1 capsule (100 mg total) by mouth 3 (three) times daily as needed for cough.  30 capsule  1  . cetirizine (ZYRTEC) 10 MG tablet Take 1 tablet (10 mg total) by mouth at bedtime.  30 tablet  11  . clonazePAM (KLONOPIN) 0.5 MG tablet Take 1 tablet (0.5 mg total) by mouth at bedtime.  30 tablet  1  . ferrous sulfate (FERROUSUL) 325 (65 FE) MG tablet Take 1 tablet (325 mg total) by mouth daily with breakfast.  90 tablet  3  . fluticasone (FLONASE) 50 MCG/ACT nasal spray Place 2 sprays into both nostrils daily.  16 g  6  . meclizine (ANTIVERT) 12.5 MG tablet Take 1 tablet (12.5 mg total) by mouth 3 (three) times  daily as needed for dizziness.  60 tablet  0  . oxyCODONE (OXY IR/ROXICODONE) 5 MG immediate release tablet Take 1 tablet (5 mg total) by mouth every 4 (four) hours as needed.  30 tablet  0  . sulfamethoxazole-trimethoprim (BACTRIM DS) 800-160 MG per tablet Take 1 tablet by mouth 2 (two) times daily.  20 tablet  0   No current facility-administered medications on file prior to visit.   History reviewed. No pertinent family history. History   Social History  . Marital Status: Married    Spouse Name: N/A    Number of Children: N/A  . Years of Education: N/A   Occupational History  . Not on file.   Social History Main Topics  . Smoking status: Never Smoker   . Smokeless tobacco: Never Used  . Alcohol Use: No  . Drug Use: No  . Sexual Activity: Not on file   Other Topics Concern  . Not on file   Social History Narrative  . No narrative on file    Review of Systems: See HPI  Objective:   Filed Vitals:   11/27/13 1555  BP: 137/98  Pulse: 65  Temp: 98.2 F (36.8 C)  Resp: 17   Physical Exam  Vitals reviewed. Constitutional: She is oriented to person, place, and time. She appears  well-nourished.  Cardiovascular: Normal rate, regular rhythm and normal heart sounds.   Pulmonary/Chest: Effort normal and breath sounds normal.  Abdominal: Soft. Bowel sounds are normal. She exhibits no distension. There is no tenderness.  Lymphadenopathy:    She has no cervical adenopathy.  Neurological: She is alert and oriented to person, place, and time.  Skin: Skin is warm and dry.  Psychiatric: She has a normal mood and affect. Thought content normal.    Lab Results  Component Value Date   WBC 10.8* 05/20/2013   HGB 12.0 05/20/2013   HCT 36.2 05/20/2013   MCV 84.2 05/20/2013   PLT 315 05/20/2013   Lab Results  Component Value Date   CREATININE 0.54 05/20/2013   BUN 16 05/20/2013   NA 136 05/20/2013   K 4.1 05/20/2013   CL 104 05/20/2013   CO2 23 05/20/2013    Lab  Results  Component Value Date   HGBA1C 5.5 05/20/2013   Lipid Panel  No results found for this basename: chol, trig, hdl, cholhdl, vldl, ldlcalc       Assessment and plan:   Nancy [name] was seen today for dizziness.  Diagnoses and associated orders for this visit:  Anemia - CBC with Differential - Anemia panel Changed ferrous sulfate from daily to TID. - ferrous sulfate (FERROUSUL) 325 (65 FE) MG tablet; Take 1 tablet (325 mg total) by mouth 3 (three) times daily with meals.  Menorrhagia - Ambulatory referral to Gynecology - etonogestrel-ethinyl estradiol (NUVARING) 0.12-0.015 MG/24HR vaginal ring; Insert vaginally and leave in place for 3 consecutive weeks, then remove for 1 week. To assist with prolonged bleeding.  Explained risk of Nuvaring and risk for blood clots due to weight and sedentary lifestyle. Patient may try this option until GYN appointment. Explained to patient GYN may offer options such as IUD placement that have a lower chance of blood clots.     RTC if problem worsens or persist.    Ambrose Finland, NP-C [facility] [phone] 11/30/2013, 8:52 PM

## 2013-12-01 ENCOUNTER — Ambulatory Visit: Payer: No Typology Code available for payment source | Admitting: Internal Medicine

## 2013-12-02 ENCOUNTER — Telehealth: Payer: Self-pay | Admitting: *Deleted

## 2013-12-02 NOTE — Telephone Encounter (Signed)
Message copied by Fredderick Severance on Tue Dec 02, 2013 12:46 PM ------      Message from: [place] A      Created: Sun Nov 30, 2013  6:30 PM       Patient anemia has progressed, just remind patient that she needs to take her iron pills three times per day. Thanks. ------

## 2013-12-02 NOTE — Telephone Encounter (Signed)
Attempted to reach patient via [employer] to review lab results. VM message left by interpreter to return call.

## 2013-12-03 ENCOUNTER — Telehealth: Payer: Self-pay

## 2013-12-03 NOTE — Telephone Encounter (Signed)
Patient is aware of her blood results  

## 2013-12-23 ENCOUNTER — Ambulatory Visit: Payer: No Typology Code available for payment source

## 2014-01-14 ENCOUNTER — Encounter: Payer: Self-pay | Admitting: Obstetrics & Gynecology

## 2014-01-16 ENCOUNTER — Ambulatory Visit (INDEPENDENT_AMBULATORY_CARE_PROVIDER_SITE_OTHER): Payer: Self-pay | Admitting: Obstetrics & Gynecology

## 2014-01-16 ENCOUNTER — Encounter: Payer: Self-pay | Admitting: Obstetrics & Gynecology

## 2014-01-16 VITALS — BP 122/73 | HR 81 | Temp 97.7°F | Ht 63.0 in | Wt 261.3 lb

## 2014-01-16 DIAGNOSIS — N920 Excessive and frequent menstruation with regular cycle: Secondary | ICD-10-CM

## 2014-01-16 NOTE — Patient Instructions (Signed)
Ethinyl Estradiol; Etonogestrel vaginal ring Qu es este medicamento? El ETINIL ESTRADIOL; ETONOGESTREL es un anillo vaginal flexible utilizado como un anticonceptivo (control de natalidad). Este producto [employer] tipos de hormonas femeninas, un estrgeno y Ardelia Mems progestina. Este anillo se [place] para Mining engineer ovulacin y Water quality scientist. Cada anillo es efectivo durante un mes. Este medicamento puede ser utilizado para otros usos; si tiene alguna pregunta consulte con su proveedor de atencin mdica o con su farmacutico. MARCAS COMERCIALES DISPONIBLES: NuvaRing Qu le debo informar a mi profesional de la salud antes de tomar este medicamento? Necesita saber si usted presenta alguno de los siguientes problemas o situaciones: -sangrado vaginal anormal -enfermedad vascular o cogulos sanguneos -cncer de mama, cervical, endometrio, ovario, hgado o uterino -diabetes -enfermedad de la vescula biliar -enfermedad cardiaca o ataque cardiaco reciente -alta presin sangunea -niveles elevados de colesterol -enfermedad renal -enfermedad heptica -migraa -derrame cerebral -lupus eritematoso sistmico (LES) -fumador -una reaccin alrgica o inusual a los estrgenos, progestinas, otros medicamentos, alimentos, colorantes o conservantes -si est embarazada o buscando quedar embarazada -si est amamantando a un beb Cmo debo utilizar este medicamento? Introduzca el anillo en su vagina, segn las indicaciones. Siga las instrucciones de la etiqueta del [place]. El anillo vaginal Futures trader en su lugar durante 3 semanas y luego debe retirar el anillo por 1 semana de descanso. Debe introducir un anillo nuevo 1 semana despus de retirar el ltimo anillo, en el mismo da de la [place]. No use su medicamento con una frecuencia mayor a la indicada. Usted recibir un prospecto para el paciente para este producto con cada receta y relleno. Asegrese de leer este folleto cada vez cuidadosamente. Este  folleto puede cambiar con frecuencia. Hable con su pediatra para informarse acerca del uso de este medicamento en nios. Puede requerir atencin especial. Este medicamentos ha sido usado en nias que han empezados a tener perodos [place]. Sobredosis: Pngase en contacto inmediatamente con un centro toxicolgico o una sala de urgencia si usted cree que haya tomado demasiado medicamento. ATENCIN: [employer] es solo para usted. No comparta este medicamento con nadie. Qu sucede si me olvido de una dosis? Generalmente, slo necesitar reemplazar su anillo vaginal una vez por mes. Pregunte a su profesional de la salud qu hacer si deja su anillo vaginal colocado durante un perodo de tiempo mayor o menor del que se supone o si se Diplomatic Services operational officer. Qu puede interactuar con este medicamento? -acetaminofeno -antibiticos o medicamentos para infecciones, especialmente rifampicina, rifabutina, rifapentina y griseofulvina y posiblemente penicilina o tetraciclina -aprepitant -cido ascrbico (vitamina C) -atorvastatina -barbitricos, como fenobarbital -bosentano -carbamazepina -cafena -clofibrato -ciclosporina -dantroleno -doxercalciferol -felbamato -jugo de toronja -hidrocortisona -medicamentos para la ansiedad o para los problemas para conciliar el sueo, como diazepam o temazepam -medicamentos para la diabetes como pioglitazona -modafinilo -micofenolato -nefazodona -oxcarbazepina -fenitona -prednisolona -ritonavir u otros medicamentos para tratar el virus VIH o el SIDA -rosuvastatina -selegilina -suplementos de isoflavonas de soya -hierba de [place] ser que esta lista no menciona todas las posibles interacciones. Informe a su profesional de [employer]	[place] de [employer] productos a base de hierbas, medicamentos de [place] o suplementos nutritivos que est tomando. Si usted fuma, consume  bebidas alcohlicas o si utiliza drogas ilegales, indqueselo tambin a su profesional de [employer]	[place]. Algunas sustancias pueden interactuar con su medicamento. A qu debo estar atento al usar [employer]? Visite a su mdico o su profesional de la salud para chequear su evolucin peridicamente.  Deber hacerse exmenes de las mamas y la pelvis y exmenes de Papanicolaou en forma regular mientras est usando este medicamento. Use un mtodo anticonceptivo Paediatric nurse ciclo que est usando este anillo. Si tiene algn motivo para pensar que est embarazada, deje de usar este medicamento de inmediato y comunquese con su mdico o con su profesional de Technical sales engineer. Si toma este medicamento para problemas relacionados con las hormonas, pueden pasar varios ciclos hasta que observe mejoras en su afeccin. El fumar aumenta el riesgo de formacin de cogulos o de sufrir un derrame cerebral mientras [place] anticonceptivos hormonales, especialmente si tiene ms de 35 aos. Se le recomienda enfticamente que no fume. Este medicamento puede hacer que su cuerpo retenga lquido, lo que puede provocar que se le hinchen los dedos, manos o tobillos. Su presin sangunea Regulatory affairs officer. Comunquese con su mdico o con su profesional de la salud si siente que est reteniendo lquido. Este medicamento puede aumentar la sensibilidad al sol. Mantngase fuera de Administrator. Si no lo puede evitar, utilice ropa protectora y crema de Photographer. No utilice lmparas solares, camas solares ni cabinas solares. Si [place] lentes de contacto y observa cambios en la visin, o si los lentes comienzan a resultarle incmodos, consulte al especialista en ojos. Algunas mujeres pueden presentar sensibilidad, hinchazn o sangrado leve de las encas. Informe a su dentista si esto sucede. Cepillarse los dientes y limpiarlos con hilo dental peridicamente puede ayudar a controlar este fenmeno. Visite peridicamente a su dentista e  infrmele acerca de los medicamentos que est tomando. Si va a someterse a Brewing technologist, tal vez deba dejar de usar este medicamento de antemano. Consulte con su profesional de la salud por asesorimiento. Este medicamento no la protegen de la infeccin por VIH (SIDA) ni de ninguna otra enfermedad de transmisin sexual. Qu efectos secundarios puedo tener al utilizar este medicamento? Efectos secundarios que debe informar a su mdico o a Barrister's clerk de la salud tan pronto como sea posible: -secreciones o cambios en el tejido de las mamas -cambios en el sangrado vaginal durante el perodo menstrual o entre perodos menstruales -dolor en el pecho -tos con sangre -mareos o desmayos -dolores de cabeza o migraas -dolores de cabeza severos o repentinos -Administrator (severo) -falta de aliento repentina -falta de coordinacin repentina, especialmente en un lado del cuerpo -problemas del habla -sntomas de infeccin vaginal como picazn, irritacin o flujo inusual -sensibilidad en la parte superior del abdomen -vmito -debilidad o entumecimiento de los brazos o piernas, especialmente de un lado del cuerpo -color amarillento de los ojos o la piel Efectos secundarios que, por lo general, no requieren atencin mdica (debe informarlos a su mdico o a su profesional de la salud si persisten o si son molestos): -sangrado o manchas inesperadas que continan despus de los 3 primeros ciclos de pldoras -sensibilidad en las mamas -cambios de estados de nimo, ansiedad, depresin, frustracin, ira o exaltacin -aumento de la sensibilidad al sol o a la luz ultravioleta -nuseas -erupcin cutnea, acn o manchas marrones en la piel -aumento de peso (leve) Puede ser que esta lista no menciona todos los posibles efectos secundarios. Comunquese a su mdico por asesoramiento mdico [employer]. Usted puede informar los efectos secundarios a la FDA por telfono al  1-800-FDA-1088. Dnde debo guardar mi medicina? Mantngala fuera del alcance de los nios. Gurdela a [employer], entre 15 y 68 grados C (25 y 52 grados F) durante hasta 4 meses. Este producto  se expirara despus de 4 meses. Protjala de la luz. Deseche todos los medicamentos que no haya utilizado, despus de la fecha de vencimiento. ATENCIN: Este folleto es un resumen. Puede ser que no cubra toda la posible informacin. Si usted tiene preguntas acerca de esta medicina, consulte con su mdico, su farmacutico o su profesional de Radiographer, therapeuticla salud.  2015, Elsevier/Gold Standard. (2008-07-31 14:19:15)

## 2014-01-16 NOTE — Progress Notes (Signed)
Subjective:     Patient ID: Nancy [name], female   DOB: 26-Apr-1981, 33 y.o.   MRN: [id number]  [id number] Patient's last menstrual period was 12/26/2013. [facility], was started on Nuvaring for menometrorrhagia which gave her a normal period this month. US 11/2013 was normal.    Review of Systems  Constitutional: Negative.   Genitourinary: Positive for menstrual problem. Negative for vaginal bleeding and vaginal discharge.  Neurological: Negative for weakness.       Objective:   Physical Exam  Constitutional: She is oriented to person, place, and time. She appears well-developed. No distress.  obese  Pulmonary/Chest: Effort normal. No respiratory distress.  Neurological: She is alert and oriented to person, place, and time.  Skin: No pallor.  Psychiatric: She has a normal mood and affect. Her behavior is normal.       Assessment:     H/o menometrorrhagia, controlled with Nuvaring     Plan:     Continue Nuvaring, recommend free pap screening  Adam PhenixJames G Rumeal Cullipher, MD 01/16/2014 12:01 PM

## 2014-01-26 ENCOUNTER — Other Ambulatory Visit: Payer: Self-pay | Admitting: Internal Medicine

## 2014-03-01 IMAGING — US US PELVIS COMPLETE
1 series · 14 of 25 positions shown · non-contrast
Comparison: None

CLINICAL DATA: Menorrhagia



[Series 1: us pelvis complete · 0.33mm/px · 14 of 50 slices shown]
[im 1/50]
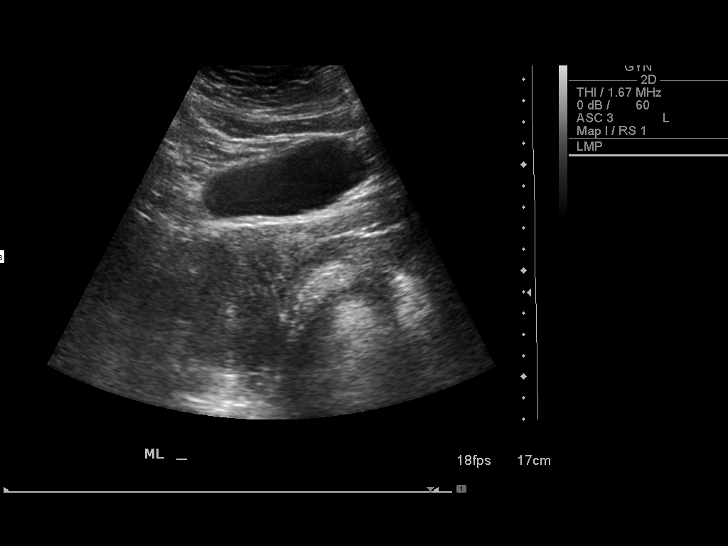
[im 5/50]
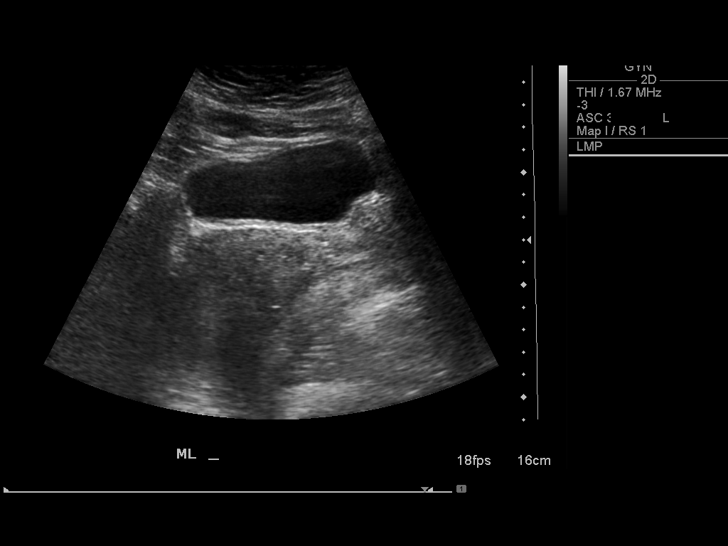
[im 9/50]
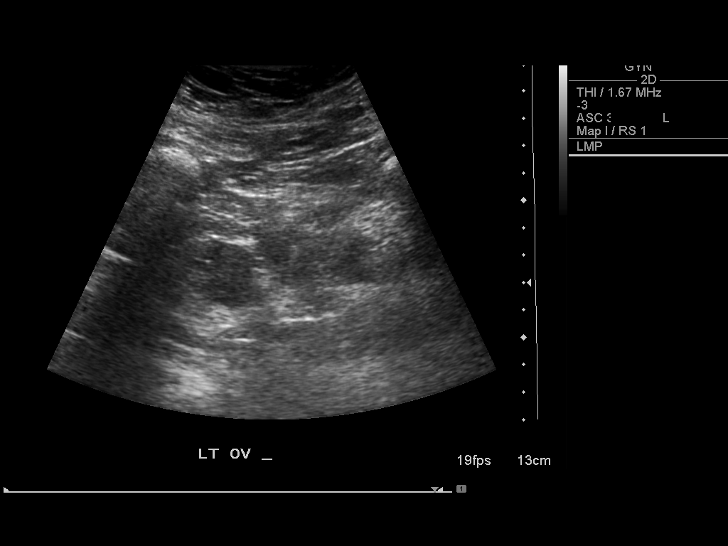
[im 13/50]
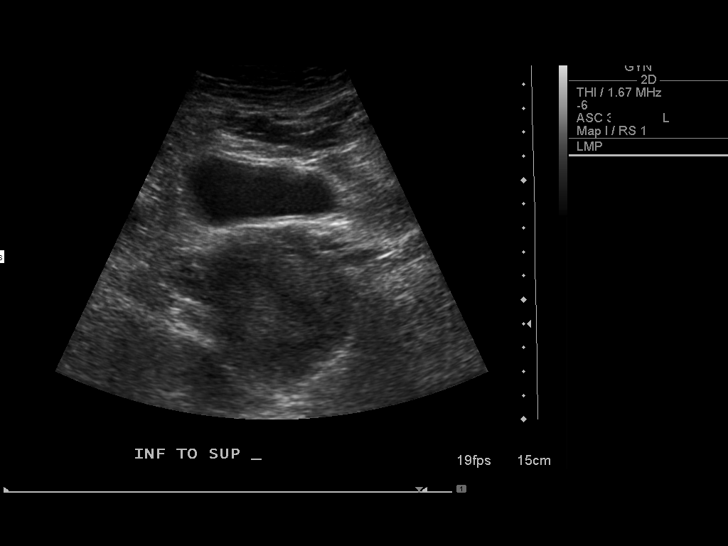
[im 17/50]
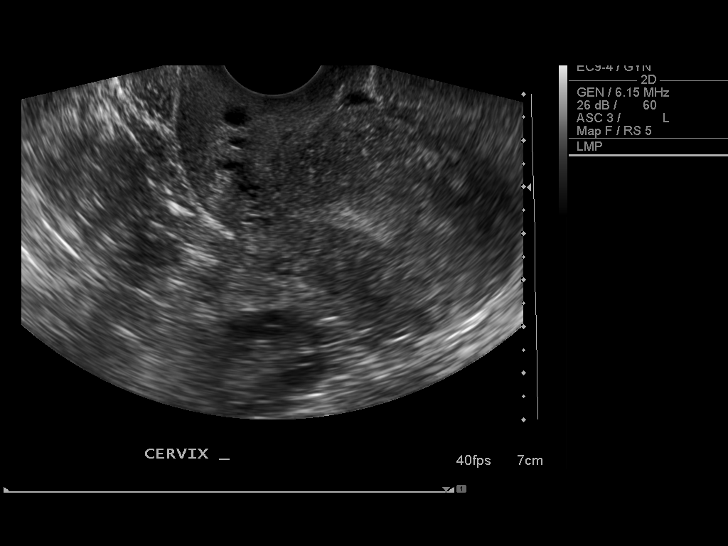
[im 19/50]
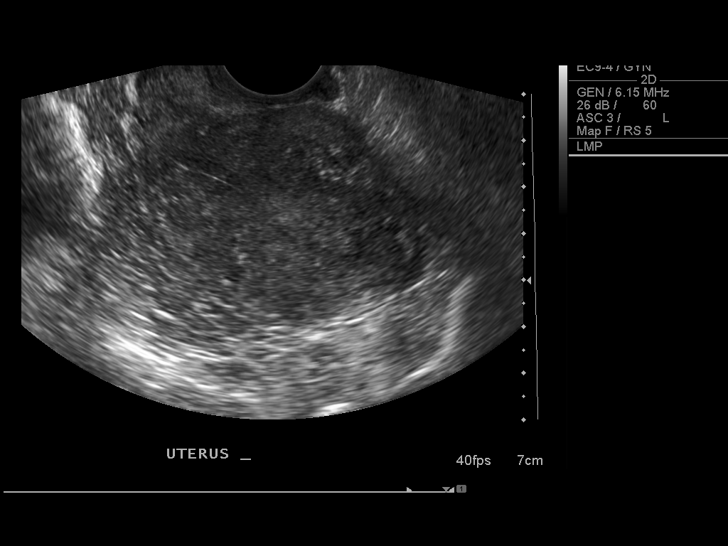
[im 23/50]
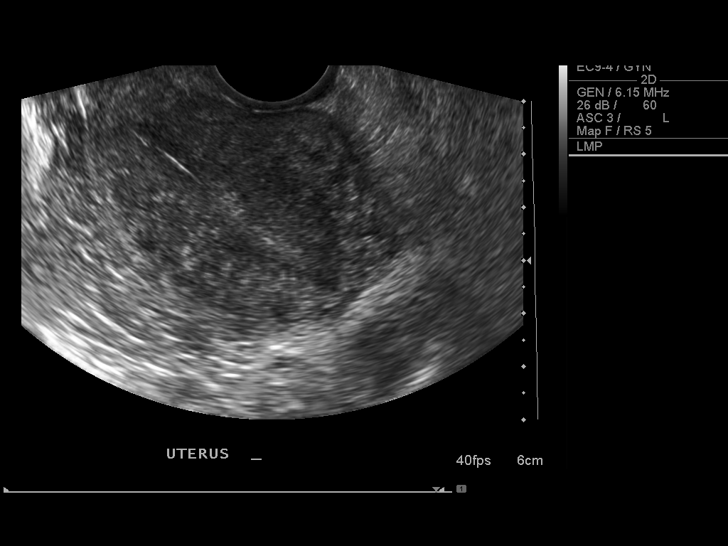
[im 27/50]
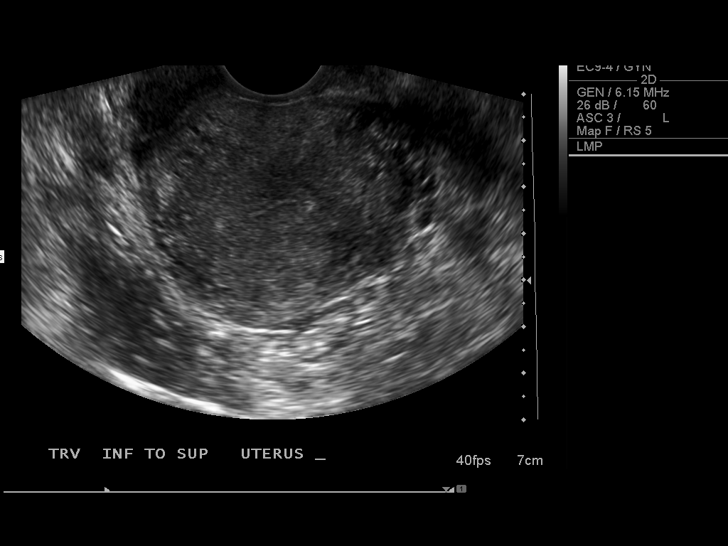
[im 31/50]
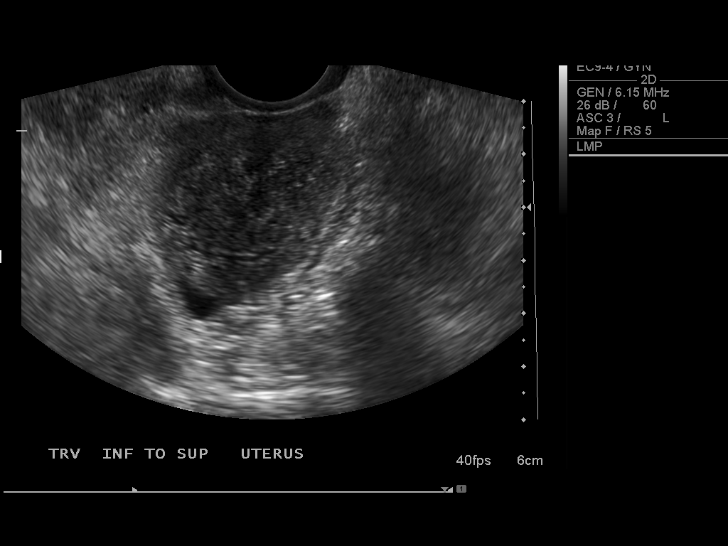
[im 33/50]
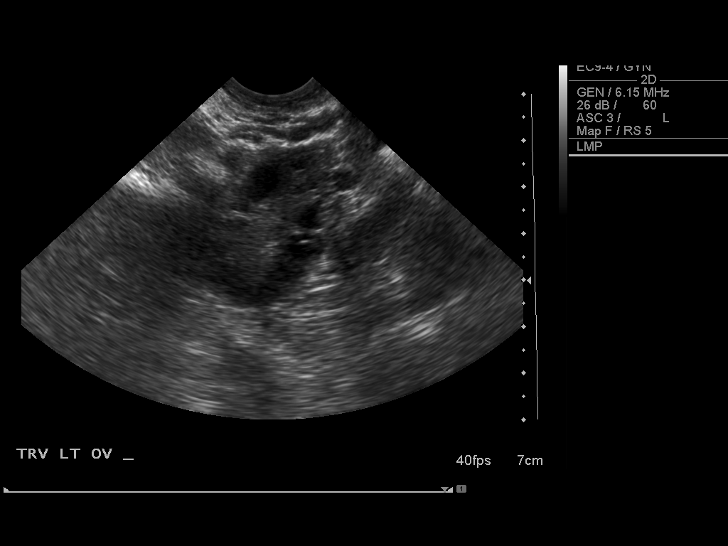
[im 37/50]
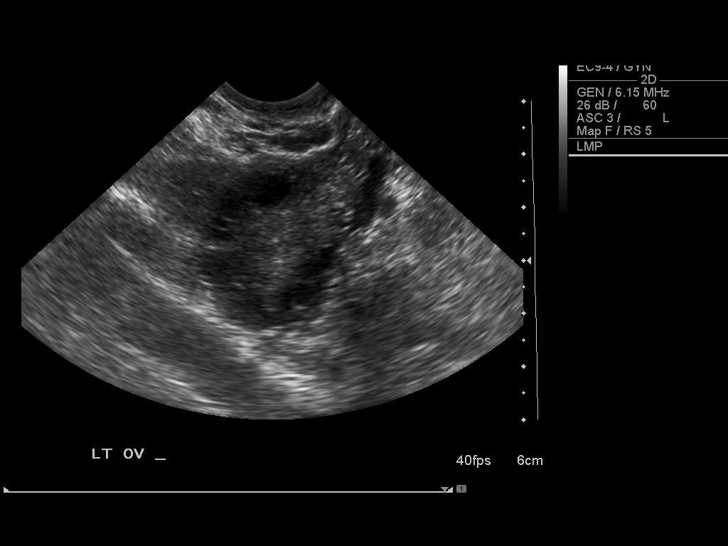
[im 41/50]
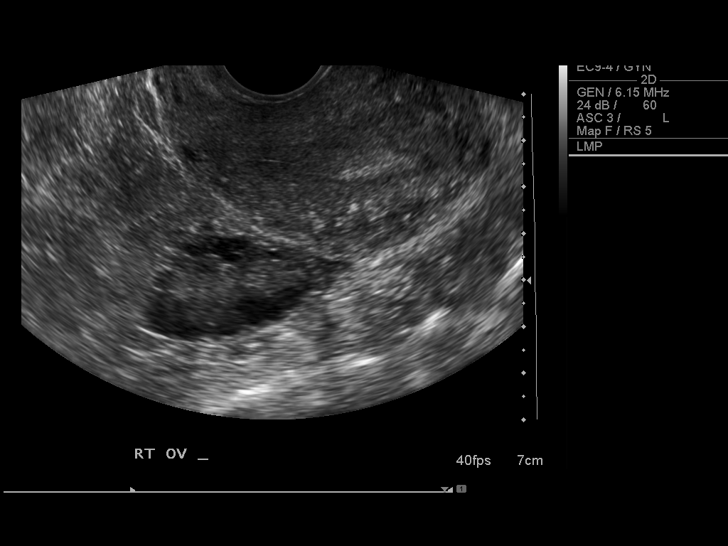
[im 45/50]
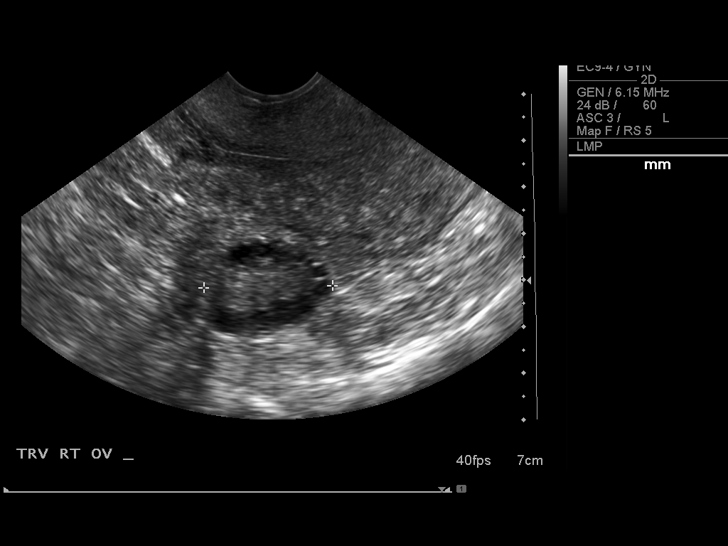
[im 50/50]
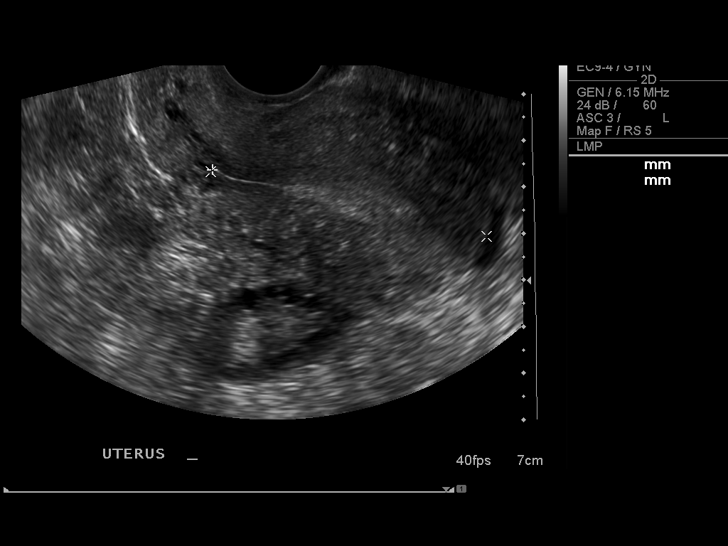

[14 of 25 positions shown; findings below may reference images not displayed]

FINDINGS: Uterus: Retroverted.  Measures 8.7 x 4.1 x 5.7 cm.

Endometrium: Normal in thickness and appearance, measuring 6 mm.

Right ovary:  Measures 4.3 x 2.3 x 2.8 cm and is notable for
multiple immature peripheral follicles.

Left ovary: Measures 3.9 x 2.4 x 2.3 cm and is notable for multiple
immature peripheral follicles.

Other findings: Trace free fluid.
IMPRESSION: Normal sonographic appearance of the uterus.

Endometrial complex measures 6 mm.

Bilateral ovaries are notable for multiple immature peripheral
follicles, nonspecific. In the appropriate clinical setting, this
could be compatible with the clinical diagnosis of PCOS.

## 2014-04-27 ENCOUNTER — Encounter: Payer: Self-pay | Admitting: Obstetrics & Gynecology

## 2014-05-06 ENCOUNTER — Ambulatory Visit: Payer: Self-pay | Attending: Internal Medicine | Admitting: Internal Medicine

## 2014-05-06 ENCOUNTER — Encounter: Payer: Self-pay | Admitting: Internal Medicine

## 2014-05-06 ENCOUNTER — Ambulatory Visit (HOSPITAL_COMMUNITY)
Admission: RE | Admit: 2014-05-06 | Discharge: 2014-05-06 | Disposition: A | Discharge status: Home / Self Care | Payer: Self-pay | Source: Ambulatory Visit | Attending: Cardiology | Admitting: Cardiology

## 2014-05-06 ENCOUNTER — Other Ambulatory Visit: Payer: Self-pay

## 2014-05-06 VITALS — BP 116/78 | HR 90 | Temp 98.2°F | Resp 16 | Ht 62.0 in | Wt 256.0 lb

## 2014-05-06 DIAGNOSIS — R5381 Other malaise: Secondary | ICD-10-CM | POA: Insufficient documentation

## 2014-05-06 DIAGNOSIS — R531 Weakness: Secondary | ICD-10-CM | POA: Insufficient documentation

## 2014-05-06 DIAGNOSIS — F419 Anxiety disorder, unspecified: Secondary | ICD-10-CM

## 2014-05-06 DIAGNOSIS — R51 Headache: Secondary | ICD-10-CM | POA: Insufficient documentation

## 2014-05-06 DIAGNOSIS — Z975 Presence of (intrauterine) contraceptive device: Secondary | ICD-10-CM | POA: Insufficient documentation

## 2014-05-06 DIAGNOSIS — F410 Panic disorder [episodic paroxysmal anxiety] without agoraphobia: Secondary | ICD-10-CM | POA: Insufficient documentation

## 2014-05-06 LAB — POCT GLYCOSYLATED HEMOGLOBIN (HGB A1C): Hemoglobin A1C: 5.5

## 2014-05-06 LAB — GLUCOSE, POCT (MANUAL RESULT ENTRY): POC Glucose: 158 mg/dl — AB (ref 70–99)

## 2014-05-06 MED ORDER — ALPRAZOLAM 0.5 MG PO TABS: 0.5000 mg | ORAL_TABLET | Freq: Two times a day (BID) | ORAL | Status: DC | PRN

## 2014-05-06 NOTE — Progress Notes (Signed)
Pt states that for the last 3 weeks she has had anxiety and some panic attacks. Pt said that when she does not eat she get anxiety, nervous, cold sweats and a racing heart.

## 2014-05-06 NOTE — Progress Notes (Signed)
Patient ID: Nancy [name], female   DOB: March 25, 1981, 33 y.o.   MRN: [id number]  CC: anxiety  HPI:  Patient presents to clinic today for evaluation of anxiety and panic attacks.  For the past 3 weeks she c/o of increased anxiety and panic attacks.  She reports that when she does not eat she begins to get anxious, nervous, palpitations, and cold sweats.  Has history of panic attacks 9 years ago.  She states that she is christian and prays to God when she begins to feel very stressed. She gets headaches and weak when she does not eat on time.  She reports that if she waits at least 30 minutes more than usually she begins to have these symptoms. She has a positive family history of type 2, DM.  Patient denies chest pain, SOB, edema, or recent travels.   Allergies  Allergen Reactions  . Penicillins Swelling and Rash   Past Medical History  Diagnosis Date  . Gallstones   . Anemia    Current Outpatient Prescriptions on File Prior to Visit  Medication Sig Dispense Refill  . acetaminophen (TYLENOL) 325 MG tablet Take 2 tablets (650 mg total) by mouth every 6 (six) hours as needed. 30 tablet 1  . ferrous sulfate (FERROUSUL) 325 (65 FE) MG tablet Take 1 tablet (325 mg total) by mouth 3 (three) times daily with meals. 90 tablet 3  . NUVARING 0.12-0.015 MG/24HR vaginal ring INSERT VAGINALLY AND LEAVE IN PLACE FOR 3 CONSECUTIVE WEEKS, THEN REMOVE FOR 1 WEEK. 1 each 4  . cetirizine (ZYRTEC) 10 MG tablet Take 1 tablet (10 mg total) by mouth at bedtime. 30 tablet 11   No current facility-administered medications on file prior to visit.   History reviewed. No pertinent family history. History   Social History  . Marital Status: Married    Spouse Name: N/A    Number of Children: N/A  . Years of Education: N/A   Occupational History  . Not on file.   Social History Main Topics  . Smoking status: Never Smoker   . Smokeless tobacco: Never Used  . Alcohol Use: No  . Drug Use: No  . Sexual  Activity: Not on file   Other Topics Concern  . Not on file   Social History Narrative   Review of Systems  Constitutional: Positive for malaise/fatigue.  Gastrointestinal: Positive for nausea and abdominal pain.  Neurological: Positive for dizziness, tingling and weakness.  Psychiatric/Behavioral: The patient is nervous/anxious.   All other systems reviewed and are negative.    Objective:   Filed Vitals:   05/06/14 1012  BP: 116/78  Pulse: 90  Temp: 98.2 F (36.8 C)  Resp: 16   Physical Exam  Constitutional: No distress.  Cardiovascular: Normal rate, regular rhythm and normal heart sounds.   No murmur heard. Pulmonary/Chest: Effort normal and breath sounds normal.  Abdominal: Soft. Bowel sounds are normal.  Musculoskeletal: She exhibits no edema.  Skin: Skin is warm and dry. She is not diaphoretic.     Lab Results  Component Value Date   WBC 10.2 11/27/2013   HGB 10.3* 11/27/2013   HCT 31.8* 11/27/2013   MCV 78.3 11/27/2013   PLT 326 11/27/2013   Lab Results  Component Value Date   CREATININE 0.54 05/20/2013   BUN 16 05/20/2013   NA 136 05/20/2013   K 4.1 05/20/2013   CL 104 05/20/2013   CO2 23 05/20/2013    Lab Results  Component Value Date  HGBA1C 5.5 05/20/2013   Lipid Panel  No results found for: CHOL, TRIG, HDL, CHOLHDL, VLDL, LDLCALC     Assessment and plan:   Nancy [name] was seen today for follow-up.  Diagnoses and associated orders for this visit:  Anxiety - ALPRAZolam (XANAX) 0.5 MG tablet; Take 1 tablet (0.5 mg total) by mouth 2 (two) times daily as needed for anxiety. Discussed with patient that anxiety symptoms are her body's response to stress. Discussed the possible need to initiate SSRI. Discussed that benzo's are habit forming and should only be used when symptoms are severe and this will not be a cyclic fill drug.  Will follow up with patient in 4 weeks to determine further needs.   Weakness - POCT glycosylated hemoglobin (Hb  A1C) - POCT glucose (manual entry)  Spent 30 minutes with patient counseling and talking about coping skills.    Return in about 5 weeks (around 06/10/2014) for anxiety.        Holland Commons, NP-C [facility] [phone] 05/06/2014, 10:29 AM\

## 2014-05-12 ENCOUNTER — Ambulatory Visit: Payer: Self-pay

## 2014-06-10 ENCOUNTER — Telehealth: Payer: Self-pay | Admitting: Internal Medicine

## 2014-06-10 NOTE — Telephone Encounter (Signed)
Pt. Came into facility for advice, pt. States that she started having vaginal bleeding yesterday with the NuvaRing.the patient would like to know what to do next. Please f/u with pt.

## 2014-06-17 ENCOUNTER — Encounter: Payer: Self-pay | Admitting: Internal Medicine

## 2014-06-17 ENCOUNTER — Ambulatory Visit: Payer: Self-pay | Attending: Internal Medicine | Admitting: Internal Medicine

## 2014-06-17 DIAGNOSIS — Z124 Encounter for screening for malignant neoplasm of cervix: Secondary | ICD-10-CM | POA: Insufficient documentation

## 2014-06-17 DIAGNOSIS — F419 Anxiety disorder, unspecified: Secondary | ICD-10-CM | POA: Insufficient documentation

## 2014-06-17 DIAGNOSIS — Z79899 Other long term (current) drug therapy: Secondary | ICD-10-CM | POA: Insufficient documentation

## 2014-06-17 DIAGNOSIS — Z880 Allergy status to penicillin: Secondary | ICD-10-CM | POA: Insufficient documentation

## 2014-06-17 DIAGNOSIS — D649 Anemia, unspecified: Secondary | ICD-10-CM | POA: Insufficient documentation

## 2014-06-17 DIAGNOSIS — N920 Excessive and frequent menstruation with regular cycle: Secondary | ICD-10-CM | POA: Insufficient documentation

## 2014-06-17 LAB — CBC
HCT: 39.3 % (ref 36.0–46.0)
Hemoglobin: 13.7 g/dL (ref 12.0–15.0)
MCH: 28.7 pg (ref 26.0–34.0)
MCHC: 34.9 g/dL (ref 30.0–36.0)
MCV: 82.2 fL (ref 78.0–100.0)
MPV: 11.2 fL (ref 9.4–12.4)
Platelets: 356 10*3/uL (ref 150–400)
RBC: 4.78 MIL/uL (ref 3.87–5.11)
RDW: 15.7 % — ABNORMAL HIGH (ref 11.5–15.5)
WBC: 13.2 10*3/uL — ABNORMAL HIGH (ref 4.0–10.5)

## 2014-06-17 LAB — POCT URINALYSIS DIPSTICK
Bilirubin, UA: NEGATIVE
Glucose, UA: NEGATIVE
Ketones, UA: NEGATIVE
Nitrite, UA: NEGATIVE
Protein, UA: NEGATIVE
Spec Grav, UA: 1.02
Urobilinogen, UA: 0.2
pH, UA: 5.5

## 2014-06-17 MED ORDER — FERROUS SULFATE 325 (65 FE) MG PO TABS: 325.0000 mg | ORAL_TABLET | Freq: Three times a day (TID) | ORAL | Status: DC

## 2014-06-17 MED ORDER — ETONOGESTREL-ETHINYL ESTRADIOL 0.12-0.015 MG/24HR VA RING: VAGINAL_RING | VAGINAL | Status: DC

## 2014-06-17 NOTE — Progress Notes (Signed)
Pt is here today concerned because she is having heavy bleeding and thinks that she may be having complications from her nuvaring.  Pt states the she is not having any other symptoms other than feeling symptoms like a normal menstrual cycle.

## 2014-06-17 NOTE — Patient Instructions (Signed)
Menorragia (Menorrhagia) Se llama menorragia a los periodos menstruales abundantes o que duran ms de lo habitual.  CUIDADOS EN EL HOGAR  Slo tome los medicamentos que le haya indicado el mdico.  Tome los comprimidos de hierro segn las indicaciones del mdico. El sangrado abundante puede causar bajos niveles de hierro en el organismo.  Notome aspirina desde 1 semana antes ni durante el perodo menstrual. La aspirina puede hacer que la hemorragia empeore.  Recustese un rato si debe cambiar el tampn o el apsito ms de una vez en 2 horas. Esto puede ayudar a Warehouse managerdisminuir el sangrado.  Consuma una dieta saludable y alimentos ricos en hierro. Entre estos alimentos se incluyen vegetales de [place]ja verde, carne, Ship Bottomhgado, Hoaglandhuevos y panes y Medical laboratory scientific officercereales de grano entero.  No trate de perder peso. Espere hasta que la hemorragia se haya detenido y sus niveles de hierro vuelvan a ser normales. SOLICITE AYUDA SI:  Empapa un tampn o un apsito cada 1 o 2 horas, y Northrop Grummanesto le ocurre cada vez que tiene el perodo.  Necesita usar apsitos y tampones al mismo tiempo porque pierde Sara Leedemasiada sangre.  Debe cambiarse el apsito o el tampn durante la noche.  Tiene un perodo que dura ms de 8 das.  Elimina cogulos ms grandes que 1 pulgada (2,5 cm).  Tiene perodos irregulares que ocurren ms o menos de una vez al mes.  Se siente mareada o se desvanece (se desmaya).  Se siente muy dbil o cansada.  Le falta el aire o siente que el corazn late muy rpido al hacer ejercicios.  Tiene ganas de vomitar ( nuseas ) y devuelve ( vomita ) mientras est tomando el medicamento.  Tiene heces acuosas (diarrea) mientras toma los medicamentos.  Tiene algn problema que pueda relacionarse con el medicamento que est tomando. SOLICITE AYUDA DE INMEDIATO SI:  Empapa 4 o ms apsitos o tampones en 2 horas.  Tiene sangrado y est embarazada. ASEGRESE DE QUE:   Comprende estas instrucciones.  Controlar su  afeccin.  Recibir ayuda de inmediato si no mejora o si empeora. Document Released: 07/15/2010 Document Revised: 02/12/2013 [facility] Patient Information 2015 [place], [place]. This information is not intended to replace advice given to you by your health care provider. Make sure you discuss any questions you have with your health care provider.

## 2014-06-17 NOTE — Progress Notes (Signed)
Patient ID: Nancy [name], female   DOB: 07-10-1980, 33 y.o.   MRN: [id number]  CC: heavy bleeding   HPI: Nancy [name] is a 33 y.o. female here today for a follow up visit.  Patient has past medical history of menorrhagia.  Bleeding since last Wednesday.  Patient reports that during the first few days she had heavy bleeding and now has spotting.  Has been on Nuvaring since July.  She states that her anxiety has improved greatly since our last visit.  She states that she never filled the prescription for Xanax because she wanted to pray about concerns. She reports that she wanted to try to control her anxiety first before trying medication.    Patient has No headache, No chest pain, No abdominal pain - No Nausea, No new weakness tingling or numbness, No Cough - SOB.  Allergies  Allergen Reactions  . Penicillins Swelling and Rash   Past Medical History  Diagnosis Date  . Gallstones   . Anemia    Current Outpatient Prescriptions on File Prior to Visit  Medication Sig Dispense Refill  . acetaminophen (TYLENOL) 325 MG tablet Take 2 tablets (650 mg total) by mouth every 6 (six) hours as needed. 30 tablet 1  . ferrous sulfate (FERROUSUL) 325 (65 FE) MG tablet Take 1 tablet (325 mg total) by mouth 3 (three) times daily with meals. 90 tablet 3  . NUVARING 0.12-0.015 MG/24HR vaginal ring INSERT VAGINALLY AND LEAVE IN PLACE FOR 3 CONSECUTIVE WEEKS, THEN REMOVE FOR 1 WEEK. 1 each 4  . ALPRAZolam (XANAX) 0.5 MG tablet Take 1 tablet (0.5 mg total) by mouth 2 (two) times daily as needed for anxiety. (Patient not taking: Reported on 06/17/2014) 30 tablet 0  . cetirizine (ZYRTEC) 10 MG tablet Take 1 tablet (10 mg total) by mouth at bedtime. (Patient not taking: Reported on 06/17/2014) 30 tablet 11   No current facility-administered medications on file prior to visit.   History reviewed. No pertinent family history. History   Social History  . Marital Status: Married    Spouse Name: N/A    Number  of Children: N/A  . Years of Education: N/A   Occupational History  . Not on file.   Social History Main Topics  . Smoking status: Never Smoker   . Smokeless tobacco: Never Used  . Alcohol Use: No  . Drug Use: No  . Sexual Activity: Not on file   Other Topics Concern  . Not on file   Social History Narrative    Review of Systems: As per HPI   Objective:   Filed Vitals:   06/17/14 1239  BP: 138/88  Pulse: 90  Temp: 97.7 F (36.5 C)  Resp: 16    Physical Exam  Constitutional: She is oriented to person, place, and time.  Cardiovascular: Normal rate, regular rhythm and normal heart sounds.   Pulmonary/Chest: Effort normal and breath sounds normal.  Abdominal: Soft. Bowel sounds are normal. She exhibits no distension. There is no tenderness.  Neurological: She is alert and oriented to person, place, and time.  Skin: Skin is warm and dry.  Psychiatric: She has a normal mood and affect. Her behavior is normal.  '  Lab Results  Component Value Date   WBC 10.2 11/27/2013   HGB 10.3* 11/27/2013   HCT 31.8* 11/27/2013   MCV 78.3 11/27/2013   PLT 326 11/27/2013   Lab Results  Component Value Date   CREATININE 0.54 05/20/2013   BUN 16 05/20/2013  NA 136 05/20/2013   K 4.1 05/20/2013   CL 104 05/20/2013   CO2 23 05/20/2013    Lab Results  Component Value Date   HGBA1C 5.5 05/06/2014   Lipid Panel  No results found for: CHOL, TRIG, HDL, CHOLHDL, VLDL, LDLCALC     Assessment and plan:   Nancy [name] was seen today for follow-up.  Diagnoses and associated orders for this visit:  Papanicolaou smear - POCT urinalysis dipstick - Cytology - PAP [place] - Cervicovaginal ancillary only - HIV antibody (with reflex) - RPR - Cervicovaginal ancillary only  Anemia, unspecified anemia type - CBC - Begin ferrous sulfate (FERROUSUL) 325 (65 FE) MG tablet; Take 1 tablet (325 mg total) by mouth 3 (three) times daily with meals. - Refill etonogestrel-ethinyl  estradiol (NUVARING) 0.12-0.015 MG/24HR vaginal ring; INSERT VAGINALLY AND LEAVE IN PLACE FOR 3 CONSECUTIVE WEEKS, THEN REMOVE FOR 1 WEEK.   Return in about 3 months (around 09/16/2014) for menorrhagia.       Holland Commons, NP-C [facility] [phone] 06/17/2014, 12:41 PM

## 2014-06-18 LAB — RPR

## 2014-06-18 LAB — CERVICOVAGINAL ANCILLARY ONLY
Chlamydia: NEGATIVE
Neisseria Gonorrhea: NEGATIVE
Wet Prep (BD Affirm): NEGATIVE
Wet Prep (BD Affirm): NEGATIVE
Wet Prep (BD Affirm): NEGATIVE

## 2014-06-18 LAB — CYTOLOGY - PAP

## 2014-06-18 LAB — HIV ANTIBODY (ROUTINE TESTING W REFLEX): HIV 1&2 Ab, 4th Generation: NONREACTIVE

## 2014-07-01 ENCOUNTER — Telehealth: Payer: Self-pay | Admitting: *Deleted

## 2014-07-01 NOTE — Telephone Encounter (Signed)
-----   Message from Ambrose FinlandValerie A Keck, NP sent at 06/25/2014  1:37 PM EST ----- Patient pap is negative for malignancies and infections. Will repeat in 3 years.  Anemia has greatly improved.

## 2014-07-01 NOTE — Telephone Encounter (Signed)
Pt aware of lab results 

## 2014-07-24 ENCOUNTER — Ambulatory Visit: Payer: Self-pay

## 2014-07-29 ENCOUNTER — Ambulatory Visit: Payer: Self-pay | Attending: Internal Medicine | Admitting: Internal Medicine

## 2014-07-29 ENCOUNTER — Encounter: Payer: Self-pay | Admitting: Internal Medicine

## 2014-07-29 VITALS — BP 121/70 | HR 93 | Temp 99.0°F | Resp 14 | Ht 62.0 in | Wt 255.0 lb

## 2014-07-29 DIAGNOSIS — J0110 Acute frontal sinusitis, unspecified: Secondary | ICD-10-CM

## 2014-07-29 MED ORDER — FLUTICASONE PROPIONATE 50 MCG/ACT NA SUSP: 2.0000 | Freq: Every day | NASAL | Status: DC

## 2014-07-29 MED ORDER — DOXYCYCLINE HYCLATE 100 MG PO TABS: 100.0000 mg | ORAL_TABLET | Freq: Two times a day (BID) | ORAL | Status: DC

## 2014-07-29 NOTE — Patient Instructions (Signed)
Sinusitis (Sinusitis) La sinusitis es el enrojecimiento, el dolor y la inflamacin de los senos paranasales. Los senos paranasales son bolsas de aire que se encuentran dentro de los huesos de la cara (por debajo de los ojos, en la mitad de la frente o por encima de los ojos). En los senos paranasales sanos, el moco puede drenar y el aire circula a travs de ellos en su camino hacia la nariz. Sin embargo, cuando se inflaman, el moco y el aire quedan atrapados. Esto hace que se desarrollen bacterias y otros grmenes que causan infeccin. La sinusitis puede desarrollarse rpidamente y durar solo un tiempo corto (aguda) o continuar por un perodo largo (crnica). La sinusitis que dura ms de 12 semanas se considera crnica.  CAUSAS  Las causas de la sinusitis son:  Cualquier alergia que tenga.  Las anomalas estructurales, como el desplazamiento del cartlago que separa las fosas nasales (desvo del tabique), que pueden disminuir el flujo de aire por la nariz y los senos paranasales, y afectar su drenaje.  Las anomalas funcionales, como cuando los pequeos pelos (cilias) que se encuentran en los senos paranasales y que ayudan a eliminar el moco no funcionan correctamente o no estn presentes. SIGNOS Y SNTOMAS  Los sntomas de la sinusitis aguda y crnica son los mismos. Los sntomas principales son el dolor y la presin alrededor de los senos paranasales afectados. Otros sntomas son:  Dolor en los dientes superiores.  Dolor de odos.  Dolor de cabeza.  Mal aliento.  Disminucin del sentido del olfato y del gusto.  Tos, que empeora al acostarse.  Fatiga.  Fiebre.  Drenaje de moco espeso por la nariz, que generalmente es de color verde y puede contener pus (purulento).  Hinchazn y calor en los senos paranasales afectados. DIAGNSTICO  Su mdico le realizar un examen fsico. Durante el examen, el mdico:  Le revisar la nariz para buscar signos de crecimientos anormales en las  fosas nasales (plipos nasales).  Palpar los senos paranasales afectados para buscar signos de infeccin.  Ver el interior de los senos paranasales (endoscopia) a travs de un dispositivo de obtencin de imgenes que tiene una luz conectada (endoscopio). Si el mdico sospecha que usted sufre sinusitis crnica, podr indicar una o ms de las siguientes pruebas:  Pruebas de alergia.  Cultivo de las secreciones nasales. Se extrae una muestra de moco de la nariz, que se enva al laboratorio para detectar bacterias.  Citologa nasal. Se extrae una muestra de moco de la nariz, que el mdico examina para determinar si la sinusitis est relacionada con una alergia. TRATAMIENTO  La mayora de los casos de sinusitis aguda se deben a una infeccin viral y se resuelven espontneamente en un perodo de 10das. En algunos casos, se recetan medicamentos para aliviar los sntomas (analgsicos, descongestivos, aerosoles nasales con corticoides o aerosoles salinos).  Sin embargo, para la sinusitis por infeccin bacteriana, el mdico le recetar antibiticos. Los antibiticos son medicamentos que destruyen las bacterias que causan la infeccin.  Con poca frecuencia, la sinusitis tiene su origen en una infeccin por hongos. En estos casos, el mdico recetar un medicamento antimictico. Para algunos casos de sinusitis crnica, es necesario someterse a una ciruga. Generalmente, se trata de casos en los que la sinusitis se repite ms de 3veces al ao, a pesar de otros tratamientos. INSTRUCCIONES PARA EL CUIDADO EN EL HOGAR   Beber gran cantidad de lquidos. Los lquidos ayudan a disolver el moco, para que drene ms fcilmente de los senos paranasales.    Use un humidificador.  Inhale vapor de 3 a 4 veces al da (por ejemplo, sintese en el bao con la ducha abierta).  Aplquese un pao tibio y hmedo en la cara 3 o 4 veces al da o segn las indicaciones del mdico.  Use un aerosol nasal salino para ayudar  a humedecer y limpiar los senos nasales.  Tome los medicamentos solamente como se lo haya indicado el mdico.  Si le recetaron un antimictico o un antibitico, asegrese de terminarlos, incluso si comienza a sentirse mejor. SOLICITE ATENCIN MDICA DE INMEDIATO SI:  Siente ms dolor o sufre dolores de cabeza intensos.  Tiene nuseas, vmitos o somnolencia.  Observa hinchazn alrededor del rostro.  Tiene problemas de visin.  Presenta rigidez en el cuello.  Tiene dificultad para respirar. ASEGRESE DE QUE:   Comprende estas instrucciones.  Controlar su afeccin.  Recibir ayuda de inmediato si no mejora o si empeora. Document Released: 03/22/2005 Document Revised: 10/27/2013 Exit[employer] Patient Information 2015 [employer]. This information is not intended to replace advice given to you by your health care provider. Make sure you discuss any questions you have with your health care provider.  

## 2014-07-29 NOTE — Progress Notes (Signed)
Patient ID: Nancy [name], female   DOB: [birth year], 34 y.o.   MRN: [id number]  CC: ear ache  HPI: Nancy [name] is a 34 y.o. female here today for a follow up visit. For 5 days she has been having symptoms of facial pressure, ear pressure R>L, dizziness, and swollen cervical lymph nodes. She denies fever, chills, nausea, diarrhea.   Patient has No chest pain, No abdominal pain - No Nausea, No new weakness tingling or numbness, No Cough - SOB.  Allergies  Allergen Reactions  . Penicillins Swelling and Rash   Past Medical History  Diagnosis Date  . Gallstones   . Anemia    Current Outpatient Prescriptions on File Prior to Visit  Medication Sig Dispense Refill  . acetaminophen (TYLENOL) 325 MG tablet Take 2 tablets (650 mg total) by mouth every 6 (six) hours as needed. 30 tablet 1  . etonogestrel-ethinyl estradiol (NUVARING) 0.12-0.015 MG/24HR vaginal ring INSERT VAGINALLY AND LEAVE IN PLACE FOR 3 CONSECUTIVE WEEKS, THEN REMOVE FOR 1 WEEK. 3 each 4  . ALPRAZolam (XANAX) 0.5 MG tablet Take 1 tablet (0.5 mg total) by mouth 2 (two) times daily as needed for anxiety. (Patient not taking: Reported on 06/17/2014) 30 tablet 0  . cetirizine (ZYRTEC) 10 MG tablet Take 1 tablet (10 mg total) by mouth at bedtime. (Patient not taking: Reported on 06/17/2014) 30 tablet 11  . ferrous sulfate (FERROUSUL) 325 (65 FE) MG tablet Take 1 tablet (325 mg total) by mouth 3 (three) times daily with meals. (Patient not taking: Reported on 07/29/2014) 90 tablet 3   No current facility-administered medications on file prior to visit.   History reviewed. No pertinent family history. History   Social History  . Marital Status: Married    Spouse Name: N/A    Number of Children: N/A  . Years of Education: N/A   Occupational History  . Not on file.   Social History Main Topics  . Smoking status: Never Smoker   . Smokeless tobacco: Never Used  . Alcohol Use: No  . Drug Use: No  . Sexual Activity: Not on  file   Other Topics Concern  . Not on file   Social History Narrative    Review of Systems: The history of present illness  Objective:   Filed Vitals:   07/29/14 1238  BP: 121/70  Pulse: 93  Temp: 99 F (37.2 C)  Resp: 14    Physical Exam  HENT:  Bulging TM Frontal sinus tenderness  Cardiovascular: Normal rate, regular rhythm and normal heart sounds.   Pulmonary/Chest: Effort normal and breath sounds normal.     Lab Results  Component Value Date   WBC 13.2* 06/17/2014   HGB 13.7 06/17/2014   HCT 39.3 06/17/2014   MCV 82.2 06/17/2014   PLT 356 06/17/2014   Lab Results  Component Value Date   CREATININE 0.54 05/20/2013   BUN 16 05/20/2013   NA 136 05/20/2013   K 4.1 05/20/2013   CL 104 05/20/2013   CO2 23 05/20/2013    Lab Results  Component Value Date   HGBA1C 5.5 05/06/2014   Lipid Panel  No results found for: CHOL, TRIG, HDL, CHOLHDL, VLDL, LDLCALC     Assessment and plan:   Nancy [name] was seen today for follow-up.  Diagnoses and all orders for this visit:  Acute frontal sinusitis, recurrence not specified Orders: -     Begin fluticasone (FLONASE) 50 MCG/ACT nasal spray; Place 2 sprays into both nostrils daily. -  Begin doxycycline (VIBRA-TABS) 100 MG tablet; Take 1 tablet (100 mg total) by mouth 2 (two) times daily. Gave doxycycline due to penicillin allergy  Due to language barrier, an interpreter was present during the history-taking and subsequent discussion (and for part of the physical exam) with this patient.  Return if symptoms worsen or fail to improve.        Holland Commons, NP-C [facility] [phone] 07/29/2014, 1:36 PM

## 2014-07-29 NOTE — Progress Notes (Signed)
Pt is here c/o b/l ear pain that is worse in the left ear and the pain is radiating to her head and neck. Pt has an interpreter.

## 2014-08-12 ENCOUNTER — Ambulatory Visit: Payer: Self-pay | Attending: Internal Medicine | Admitting: Internal Medicine

## 2014-08-12 ENCOUNTER — Encounter: Payer: Self-pay | Admitting: Internal Medicine

## 2014-08-12 VITALS — BP 122/85 | HR 86 | Temp 97.5°F | Resp 16 | Ht 62.0 in | Wt 236.0 lb

## 2014-08-12 DIAGNOSIS — H9202 Otalgia, left ear: Secondary | ICD-10-CM

## 2014-08-12 DIAGNOSIS — R42 Dizziness and giddiness: Secondary | ICD-10-CM | POA: Insufficient documentation

## 2014-08-12 MED ORDER — CIPROFLOXACIN HCL 500 MG PO TABS: 500.0000 mg | ORAL_TABLET | Freq: Two times a day (BID) | ORAL | Status: DC

## 2014-08-12 NOTE — Progress Notes (Signed)
Pt is here following up on her left ear pain. Pt has completed the antibiotic and she is still having the pain and discomfort. Now she states that the pain in radiating down her jaw and to her face.

## 2014-08-12 NOTE — Progress Notes (Signed)
Patient ID: Nancy [name], female   DOB: 06/28/1980, 34 y.o.   MRN: [id number]  EAR PAIN  Location: Bilateral ears Ear pain started: 3 weeks ago Pain is: radiating to head, jaw, face Severity: moderate Medications tried: doxycycline and flonase on 2/3-2/13 Recent ear trauma: no Prior ear surgeries: n/a Antibiotics in the last 30 days: doxycycline History of diabetes: no  Symptoms Ear discharge: none Fever: none Pain with chewing: yes Ringing in ears: yes, pain with loud noises  Dizziness: yes  Hearing loss: no Rashes or blisters around ear: no Weight loss: no  Review of Symptoms - see HPI PMH - Smoking status noted.    Physical Exam  Constitutional: Nancy [name] is oriented to person, place, and time.  HENT:  Right Ear: External ear normal.  Left Ear: External ear normal.  Mouth/Throat: Oropharynx is clear and moist.  No tooth tenderness on exam  Cardiovascular: Normal rate, regular rhythm and normal heart sounds.   Pulmonary/Chest: Effort normal and breath sounds normal.  Lymphadenopathy:    Nancy [name] has cervical adenopathy.  Neurological: Nancy [name] is alert and oriented to person, place, and time.  Skin: Skin is warm and dry.     Nancy [name] was seen today for ear pain.  Diagnoses and all orders for this visit:  Otalgia, left Orders: -     CT Head Wo Contrast; Future -     ciprofloxacin (CIPRO) 500 MG tablet; Take 1 tablet (500 mg total) by mouth 2 (two) times daily. If CT results are inconclusive we will refer patient to ENT   Return for f/u pending results.  Holland CommonsKECK, Nancy Rotz, NP 08/13/2014 2:22 PM

## 2014-08-13 ENCOUNTER — Ambulatory Visit (HOSPITAL_COMMUNITY): Payer: Self-pay

## 2014-08-13 ENCOUNTER — Encounter: Payer: Self-pay | Admitting: Internal Medicine

## 2014-08-14 ENCOUNTER — Ambulatory Visit (HOSPITAL_COMMUNITY)
Admission: RE | Admit: 2014-08-14 | Discharge: 2014-08-14 | Disposition: A | Discharge status: Home / Self Care | Payer: Self-pay | Source: Ambulatory Visit | Attending: Internal Medicine | Admitting: Internal Medicine

## 2014-08-14 ENCOUNTER — Encounter (HOSPITAL_COMMUNITY): Payer: Self-pay

## 2014-08-14 DIAGNOSIS — R6884 Jaw pain: Secondary | ICD-10-CM | POA: Insufficient documentation

## 2014-08-14 DIAGNOSIS — H9202 Otalgia, left ear: Secondary | ICD-10-CM

## 2014-08-14 DIAGNOSIS — R51 Headache: Secondary | ICD-10-CM | POA: Insufficient documentation

## 2014-08-14 DIAGNOSIS — H9203 Otalgia, bilateral: Secondary | ICD-10-CM | POA: Insufficient documentation

## 2014-08-17 ENCOUNTER — Telehealth: Payer: Self-pay | Admitting: Internal Medicine

## 2014-08-17 DIAGNOSIS — H9203 Otalgia, bilateral: Secondary | ICD-10-CM

## 2014-08-17 NOTE — Telephone Encounter (Signed)
Pt was given CT results.  Requesting Dental information for check up ENT Referral send

## 2014-08-17 NOTE — Telephone Encounter (Signed)
Pt calling for lab results, please f/u with pt.

## 2014-08-24 ENCOUNTER — Ambulatory Visit: Payer: Self-pay

## 2014-09-15 ENCOUNTER — Ambulatory Visit: Payer: Self-pay

## 2014-09-30 ENCOUNTER — Encounter: Payer: Self-pay | Admitting: Internal Medicine

## 2014-09-30 ENCOUNTER — Ambulatory Visit: Payer: Self-pay | Attending: Internal Medicine | Admitting: Internal Medicine

## 2014-09-30 VITALS — BP 130/85 | HR 88 | Temp 97.4°F | Resp 16 | Ht 61.0 in | Wt 259.0 lb

## 2014-09-30 DIAGNOSIS — H9202 Otalgia, left ear: Secondary | ICD-10-CM

## 2014-09-30 DIAGNOSIS — J0111 Acute recurrent frontal sinusitis: Secondary | ICD-10-CM | POA: Insufficient documentation

## 2014-09-30 DIAGNOSIS — F17200 Nicotine dependence, unspecified, uncomplicated: Secondary | ICD-10-CM | POA: Insufficient documentation

## 2014-09-30 MED ORDER — CLINDAMYCIN HCL 300 MG PO CAPS: 300.0000 mg | ORAL_CAPSULE | Freq: Three times a day (TID) | ORAL | Status: DC

## 2014-09-30 MED ORDER — FLUTICASONE PROPIONATE 50 MCG/ACT NA SUSP: 2.0000 | Freq: Every day | NASAL | Status: DC

## 2014-09-30 MED ORDER — CETIRIZINE HCL 10 MG PO TABS: 10.0000 mg | ORAL_TABLET | Freq: Every day | ORAL | Status: DC

## 2014-09-30 NOTE — Progress Notes (Signed)
Pt is here c/o a constant ear ache that she has had for 2 weeks now that radiates down her jaw line.

## 2014-09-30 NOTE — Progress Notes (Signed)
Patient ID: Nancy BarerDeisy B [name], female   DOB: [birth year], 34 y.o.   MRN: [id number] EAR PAIN  Location: Bilateral ears Ear pain started: 2 months ago, then came back 2 weeks ago Pain is: radiating to head, jaw, face Severity: moderate Medications tried: cipro 2 months ago, doxy before that Recent ear trauma: no Prior ear surgeries: n/a Antibiotics in the last 30 days: none History of diabetes: no  Symptoms Ear discharge: none Fever: none Pain with chewing: yes Ringing in ears: yes, pain with loud noises  Dizziness: yes Hearing loss: no Rashes or blisters around ear: no Weight loss: no  Review of Symptoms - see HPI PMH - Smoking status noted.   Physical Exam  Constitutional: She is oriented to person, place, and time.  HENT: TTP left frontal sinuses  Right Ear: External ear normal.  Left Ear: External ear normal. Redness of tympanic membrane Mouth/Throat: Oropharynx is clear and moist.  Cardiovascular: Normal rate, regular rhythm and normal heart sounds.  Pulmonary/Chest: Effort normal and breath sounds normal.  Lymphadenopathy:   She has cervical adenopathy.  Neurological: She is alert and oriented to person, place, and time.  Skin: Skin is warm and dry.   Nancy [name] was seen today for follow-up.  Diagnoses and all orders for this visit:  Acute recurrent frontal sinusitis Orders: -     clindamycin (CLEOCIN) 300 MG capsule; Take 1 capsule (300 mg total) by mouth 3 (three) times daily. For 7 days -     fluticasone (FLONASE) 50 MCG/ACT nasal spray; Place 2 sprays into both nostrils daily. -     cetirizine (ZYRTEC) 10 MG tablet; Take 1 tablet (10 mg total) by mouth daily.  Otalgia, left See above   Return if symptoms worsen or fail to improve.  Nancy CommonsKECK, Warden Buffa, NP 09/30/2014 9:55 AM

## 2014-10-17 IMAGING — US US PELVIS COMPLETE
1 series · 13 of 25 positions shown · non-contrast
Comparison: [DATE]

CLINICAL DATA: Abnormal vaginal bleeding. Gravida 3 para 3. LMP
02/17/2013. Premenopausal.

EXAM:
TRANSABDOMINAL AND TRANSVAGINAL ULTRASOUND OF PELVIS
TECHNIQUE: Both transabdominal and transvaginal ultrasound examinations of the
pelvis were performed. Transabdominal technique was performed for
global imaging of the pelvis including uterus, ovaries, adnexal
regions, and pelvic cul-de-sac. It was necessary to proceed with
endovaginal exam following the transabdominal exam to visualize the
uterus, endometrium, ovaries, and adnexal regions.

[Series 1: us pelvis complete · 0.24mm/px · 13 of 86 slices shown]
[im 1/86]
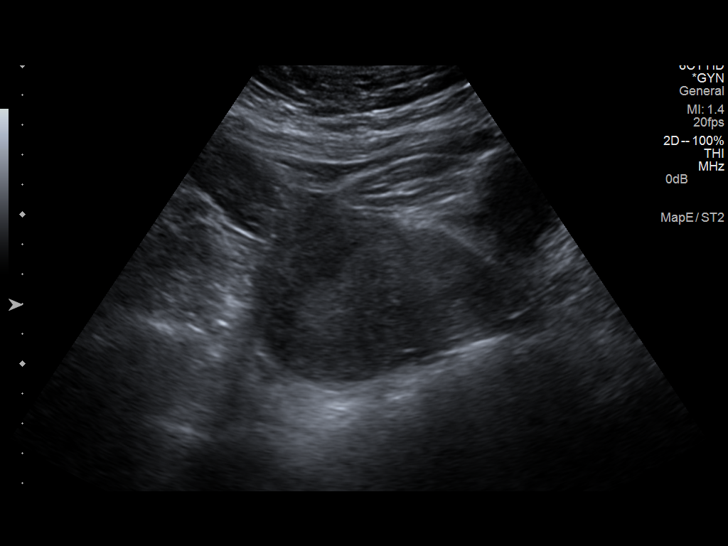
[im 8/86]
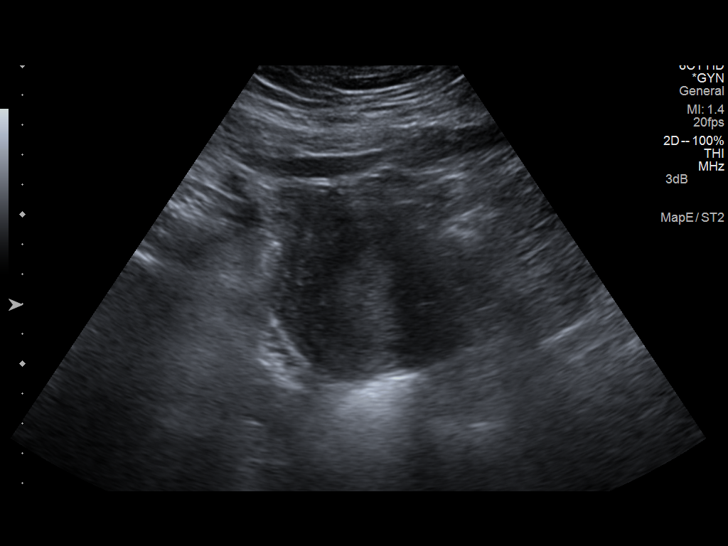
[im 15/86]
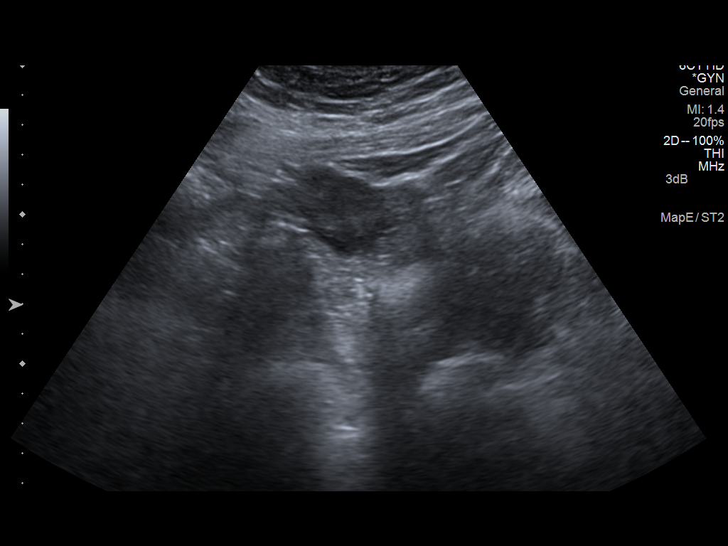
[im 22/86]
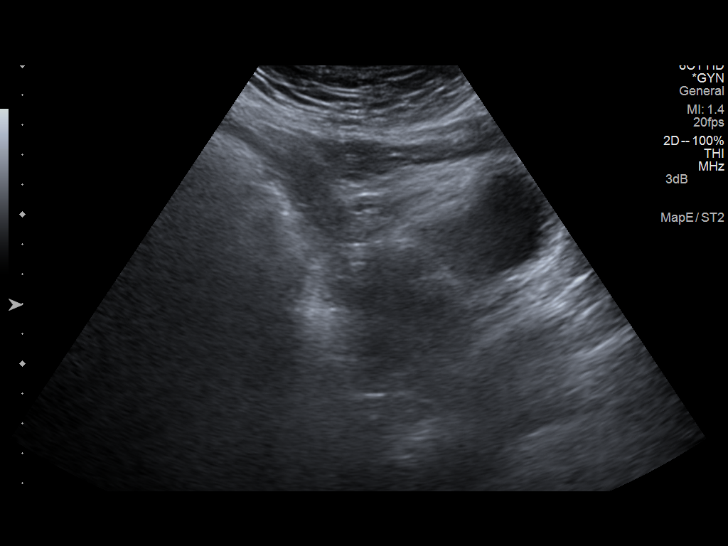
[im 29/86]
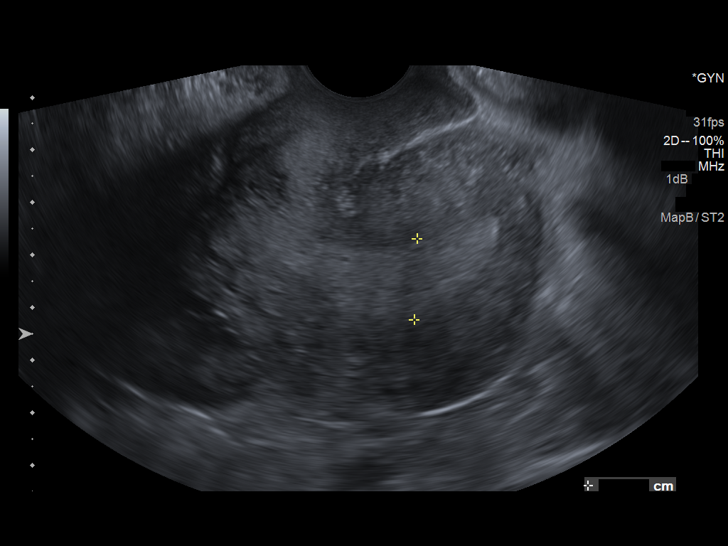
[im 36/86]
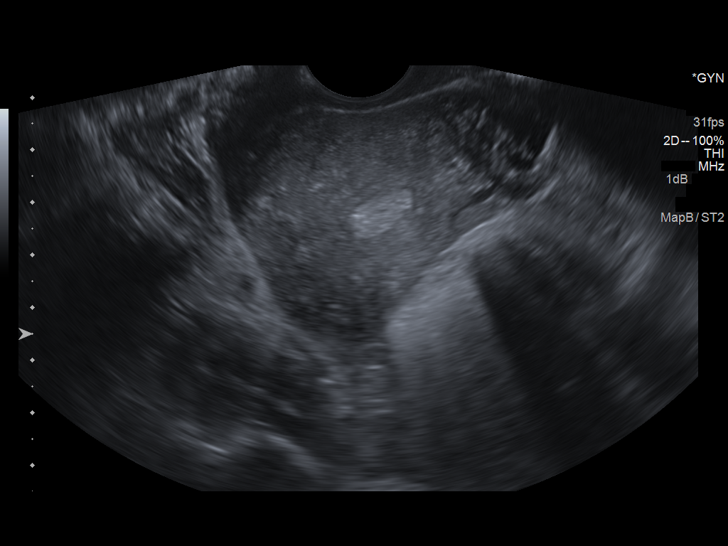
[im 43/86]
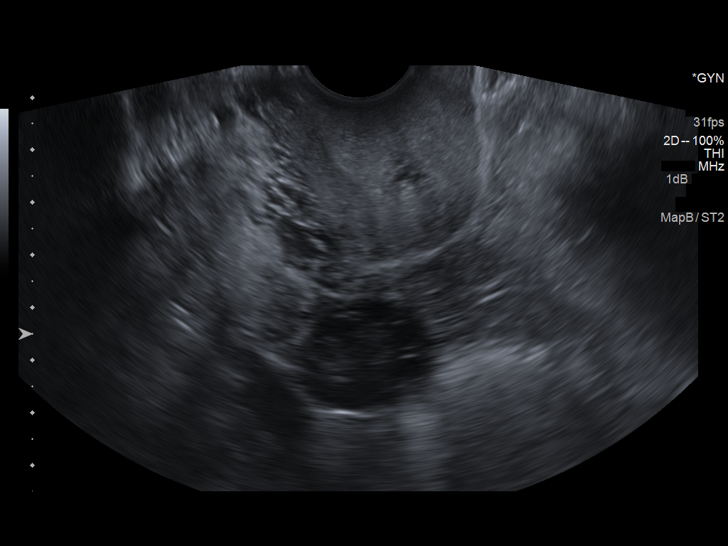
[im 50/86]
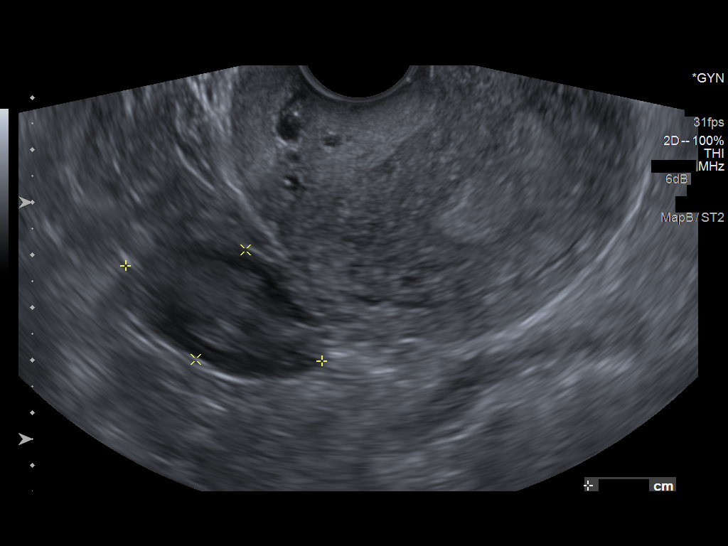
[im 57/86]
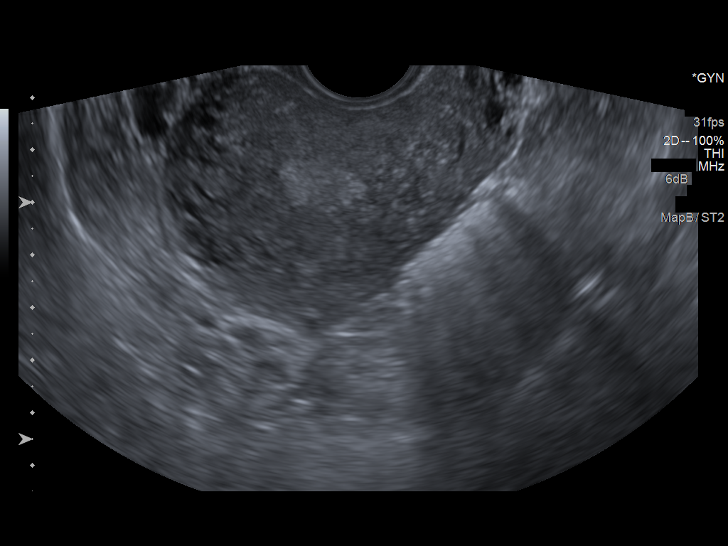
[im 64/86]
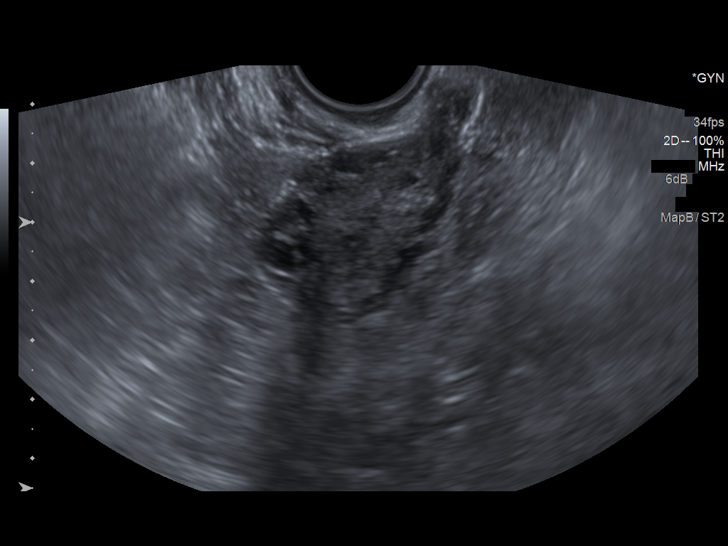
[im 71/86]
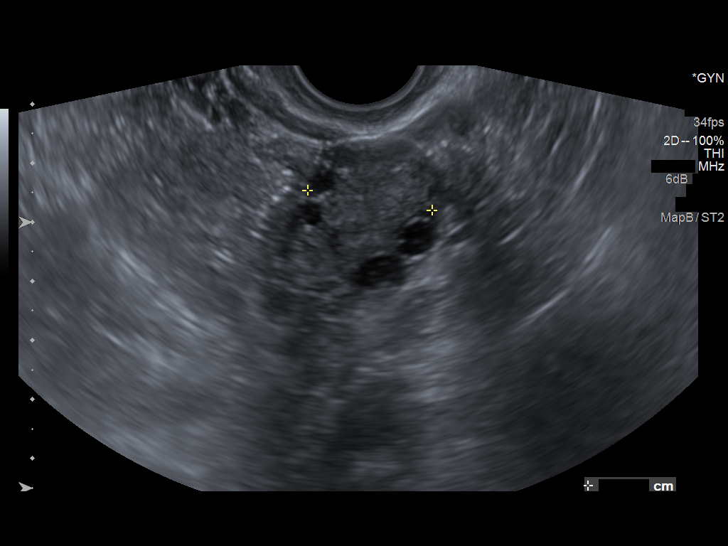
[im 78/86]
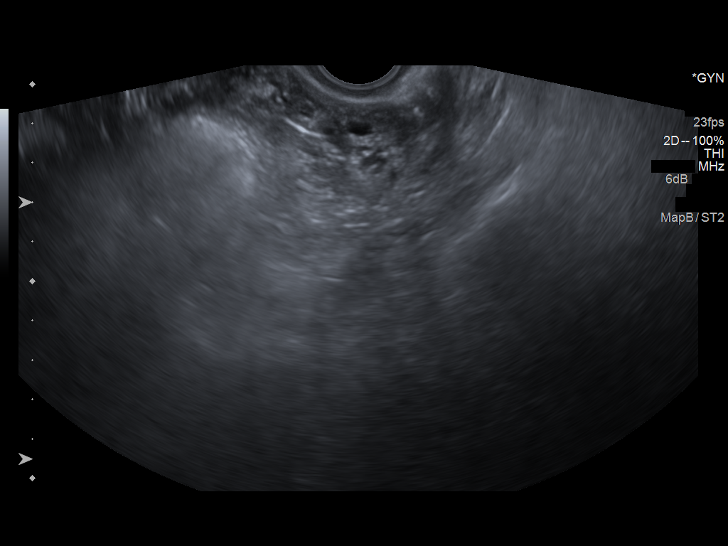
[im 86/86]
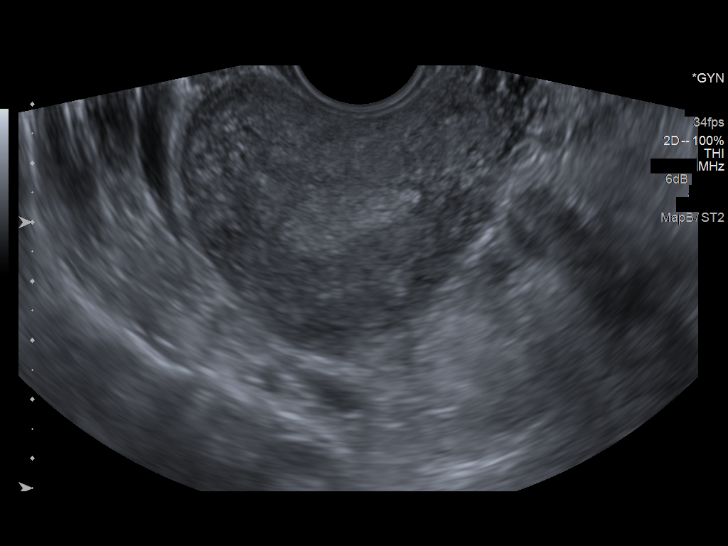

[13 of 25 positions shown; findings below may reference images not displayed]

FINDINGS: Uterus

Measurements: 6.9 x 6.1 x 5.6 cm.. No fibroids or other mass
visualized. The uterus is retroflexed

Endometrium

Thickness: 15.4 mm.  No focal abnormality visualized.

Right ovary

Measurements: 4.2 x 2.3 x 2.6 cm. Normal appearance/no adnexal mass.

Left ovary

Measurements: 4.1 x 2.6 x 2.1 cm. Normal appearance/no adnexal mass.

Other findings

No free fluid.
IMPRESSION: 1. Retroflexed uterus.
2. The endometrium is normal in appearance at upper limits normal in
thickness. If bleeding remains unresponsive to hormonal or medical
therapy, sonohysterogram should be considered for focal lesion
work-up. (Ref: Radiological Reasoning: Algorithmic Workup of
Abnormal Vaginal Bleeding with Endovaginal Sonography and
Sonohysterography. AJR 1888; 191:S68-73)
3. Normal appearance of the ovaries.

## 2014-11-30 ENCOUNTER — Ambulatory Visit: Payer: Self-pay | Admitting: Internal Medicine

## 2014-12-01 ENCOUNTER — Ambulatory Visit: Payer: Self-pay | Admitting: Internal Medicine

## 2014-12-09 ENCOUNTER — Ambulatory Visit: Payer: Self-pay | Attending: Internal Medicine | Admitting: Internal Medicine

## 2014-12-09 ENCOUNTER — Encounter: Payer: Self-pay | Admitting: Internal Medicine

## 2014-12-09 VITALS — BP 126/85 | HR 88 | Resp 16 | Wt 266.4 lb

## 2014-12-09 DIAGNOSIS — H9202 Otalgia, left ear: Secondary | ICD-10-CM

## 2014-12-09 DIAGNOSIS — R42 Dizziness and giddiness: Secondary | ICD-10-CM

## 2014-12-09 DIAGNOSIS — R0981 Nasal congestion: Secondary | ICD-10-CM

## 2014-12-09 DIAGNOSIS — N926 Irregular menstruation, unspecified: Secondary | ICD-10-CM

## 2014-12-09 DIAGNOSIS — R300 Dysuria: Secondary | ICD-10-CM

## 2014-12-09 LAB — POCT URINE PREGNANCY: Preg Test, Ur: NEGATIVE

## 2014-12-09 LAB — POCT URINALYSIS DIPSTICK
Bilirubin, UA: NEGATIVE
Ketones, UA: NEGATIVE
Nitrite, UA: NEGATIVE
Protein, UA: NEGATIVE
Spec Grav, UA: 1.01
Urobilinogen, UA: 0.2
pH, UA: 6

## 2014-12-09 MED ORDER — SULFAMETHOXAZOLE-TRIMETHOPRIM 800-160 MG PO TABS: 1.0000 | ORAL_TABLET | Freq: Two times a day (BID) | ORAL | Status: DC

## 2014-12-09 NOTE — Progress Notes (Signed)
Patient ID: Nancy [name], female   DOB: 11-Jun-1981, 34 y.o.   MRN: [id number]  CC: follow up  HPI: Nancy [name] is a 34 y.o. female here today for a follow up visit.  Patient has past medical history of anemia.  Patient reports that she took her nuva-ring out 2 months ago and has not had a period since. She states that she never put her nuvaring back in because she wanted to see if she would have normal periods again. Her husband states that she had this same problem last year and it took 3 months for her period to return.   She continues to complain of dizziness when she does not eat at a certain time. Denies headache or syncopal episodes. Patient has no history of diabetes. She also continues to have left ear pain for the past 3-4 months. She has been on ear drops and decongestants without relief.   Patient has No headache, No chest pain, No abdominal pain - No Nausea, No new weakness tingling or numbness, No Cough - SOB.  Allergies  Allergen Reactions  . Penicillins Swelling and Rash   Past Medical History  Diagnosis Date  . Gallstones   . Anemia    Current Outpatient Prescriptions on File Prior to Visit  Medication Sig Dispense Refill  . cetirizine (ZYRTEC) 10 MG tablet Take 1 tablet (10 mg total) by mouth daily. 30 tablet 11  . ferrous sulfate (FERROUSUL) 325 (65 FE) MG tablet Take 1 tablet (325 mg total) by mouth 3 (three) times daily with meals. 90 tablet 3  . fluticasone (FLONASE) 50 MCG/ACT nasal spray Place 2 sprays into both nostrils daily. 16 g 6  . etonogestrel-ethinyl estradiol (NUVARING) 0.12-0.015 MG/24HR vaginal ring INSERT VAGINALLY AND LEAVE IN PLACE FOR 3 CONSECUTIVE WEEKS, THEN REMOVE FOR 1 WEEK. (Patient not taking: Reported on 08/12/2014) 3 each 4  . ibuprofen (ADVIL,MOTRIN) 100 MG tablet Take 100 mg by mouth every 6 (six) hours as needed for fever.     No current facility-administered medications on file prior to visit.   No family history on file. History    Social History  . Marital Status: Married    Spouse Name: N/A  . Number of Children: N/A  . Years of Education: N/A   Occupational History  . Not on file.   Social History Main Topics  . Smoking status: Never Smoker   . Smokeless tobacco: Never Used  . Alcohol Use: No  . Drug Use: No  . Sexual Activity: Not on file   Other Topics Concern  . Not on file   Social History Narrative    Review of Systems: See HPI   Objective:   Filed Vitals:   12/09/14 1433  BP: 126/85  Pulse: 88  Resp: 16    Physical Exam  HENT:  Right Ear: External ear normal.  Left Ear: External ear normal.  Mouth/Throat: Oropharynx is clear and moist.  Swollen nasal turbinates, possible polyps   Cardiovascular: Normal rate, regular rhythm and normal heart sounds.   Pulmonary/Chest: Effort normal.     Lab Results  Component Value Date   WBC 13.2* 06/17/2014   HGB 13.7 06/17/2014   HCT 39.3 06/17/2014   MCV 82.2 06/17/2014   PLT 356 06/17/2014   Lab Results  Component Value Date   CREATININE 0.54 05/20/2013   BUN 16 05/20/2013   NA 136 05/20/2013   K 4.1 05/20/2013   CL 104 05/20/2013   CO2 23 05/20/2013  Lab Results  Component Value Date   HGBA1C 5.5 05/06/2014   Lipid Panel  No results found for: CHOL, TRIG, HDL, CHOLHDL, VLDL, LDLCALC     Assessment and plan:   Kerriann was seen today for discuss birth control.  Diagnoses and all orders for this visit:  Missed menses Orders: -     POCT urine pregnancy Due to birth control use. Will continue to monitor and use condoms to prevent pregnancy. She states that she wants to keep using the nuvaring because she feels like she will develop heavy bleeding again.  Dysuria Orders: -     POCT urinalysis dipstick -     sulfamethoxazole-trimethoprim (BACTRIM DS) 800-160 MG per tablet; Take 1 tablet by mouth 2 (two) times daily. For UTI.   Otalgia of left ear Will refer to ENT since this has been a ongoing issue for several  months   Nasal congestion ENT referral plus r/o nasal polyps  Continue to use flonase  Dizziness Due to going several hours without eating   Return if symptoms worsen or fail to improve.       Holland Commons, NP-C [facility] [phone] 12/09/2014, 3:00 PM

## 2014-12-09 NOTE — Progress Notes (Signed)
  Pt is here to discuss birth control. She c/o of having some dizziness if she does not eat on time.

## 2014-12-09 NOTE — Patient Instructions (Signed)
I have sent referral for ENT for your ear pain. They will call you directly

## 2014-12-11 ENCOUNTER — Telehealth: Payer: Self-pay | Admitting: Internal Medicine

## 2014-12-11 NOTE — Telephone Encounter (Signed)
Pt is requesting a refill on etonogestrel-ethinyl estradiol (NUVARING) 0.12-0.015 MG/24HR vaginal ring. Please follow up with pt.

## 2015-05-24 ENCOUNTER — Telehealth: Payer: Self-pay | Admitting: Internal Medicine

## 2015-05-24 NOTE — Telephone Encounter (Signed)
Patient has questions about being referred to [facility] for the ear specialist since she does not have insurance. Please follow up with pt. Thank you.

## 2015-06-01 NOTE — Telephone Encounter (Signed)
Patient is going to apply for the oc to be refer [place] with [facility]  Thank you

## 2015-06-02 ENCOUNTER — Ambulatory Visit: Payer: Self-pay | Admitting: Internal Medicine

## 2015-06-14 ENCOUNTER — Ambulatory Visit: Payer: Self-pay | Attending: Internal Medicine | Admitting: Internal Medicine

## 2015-06-14 ENCOUNTER — Encounter: Payer: Self-pay | Admitting: Internal Medicine

## 2015-06-14 VITALS — BP 124/91 | HR 83 | Temp 98.0°F | Resp 16 | Ht 61.0 in | Wt 276.0 lb

## 2015-06-14 DIAGNOSIS — Z880 Allergy status to penicillin: Secondary | ICD-10-CM | POA: Insufficient documentation

## 2015-06-14 DIAGNOSIS — D649 Anemia, unspecified: Secondary | ICD-10-CM

## 2015-06-14 DIAGNOSIS — F419 Anxiety disorder, unspecified: Secondary | ICD-10-CM | POA: Insufficient documentation

## 2015-06-14 DIAGNOSIS — J029 Acute pharyngitis, unspecified: Secondary | ICD-10-CM | POA: Insufficient documentation

## 2015-06-14 DIAGNOSIS — R253 Fasciculation: Secondary | ICD-10-CM | POA: Insufficient documentation

## 2015-06-14 DIAGNOSIS — J00 Acute nasopharyngitis [common cold]: Secondary | ICD-10-CM

## 2015-06-14 DIAGNOSIS — Z6841 Body Mass Index (BMI) 40.0 and over, adult: Secondary | ICD-10-CM | POA: Insufficient documentation

## 2015-06-14 DIAGNOSIS — G249 Dystonia, unspecified: Secondary | ICD-10-CM

## 2015-06-14 LAB — CBC
HCT: 42.4 % (ref 36.0–46.0)
Hemoglobin: 14.2 g/dL (ref 12.0–15.0)
MCH: 30.1 pg (ref 26.0–34.0)
MCHC: 33.5 g/dL (ref 30.0–36.0)
MCV: 89.8 fL (ref 78.0–100.0)
MPV: 11 fL (ref 8.6–12.4)
Platelets: 299 10*3/uL (ref 150–400)
RBC: 4.72 MIL/uL (ref 3.87–5.11)
RDW: 13.2 % (ref 11.5–15.5)
WBC: 12.6 10*3/uL — ABNORMAL HIGH (ref 4.0–10.5)

## 2015-06-14 LAB — LIPID PANEL
Cholesterol: 156 mg/dL (ref 125–200)
HDL: 17 mg/dL — ABNORMAL LOW (ref >=46)
LDL Cholesterol: 102 mg/dL (ref <130)
Total CHOL/HDL Ratio: 9.2 Ratio — ABNORMAL HIGH (ref <=5.0)
Triglycerides: 185 mg/dL — ABNORMAL HIGH (ref <150)
VLDL: 37 mg/dL — ABNORMAL HIGH (ref <30)

## 2015-06-14 MED ORDER — BENZONATATE 200 MG PO CAPS
200.0000 mg | ORAL_CAPSULE | Freq: Two times a day (BID) | ORAL | Status: DC | PRN
Start: 2015-06-14 — End: 2017-02-01

## 2015-06-14 MED ORDER — FLUTICASONE PROPIONATE 50 MCG/ACT NA SUSP: 2.0000 | Freq: Every day | NASAL | Status: DC

## 2015-06-14 NOTE — Progress Notes (Signed)
Patient complains of having a twitching to her right eye for the past three weeks Denies any injury No discharge/no redness

## 2015-06-14 NOTE — Progress Notes (Signed)
Patient ID: [name] B [name], female   DOB: [birth year], 34 y.o.   MRN: [id number]  CC: eye twitch  HPI: Nancy [name] is a 34 y.o. female here today for a follow up visit.  Patient has past medical history of anxiety. Patient reports that she has been suffering from eyelid twitching for the past 3 weeks. She states that she has been having anxiety and stress recently. She is not on medication for anxiety.  Patient reports that she has been having a cough and sore throat for the past 2 days. She has been exposed to her two sick children who all have similar symptoms.  Would like her blood levels rechecked and her cholesterol today.   Allergies  Allergen Reactions  . Penicillins Swelling and Rash   Past Medical History  Diagnosis Date  . Gallstones   . Anemia    Current Outpatient Prescriptions on File Prior to Visit  Medication Sig Dispense Refill  . cetirizine (ZYRTEC) 10 MG tablet Take 1 tablet (10 mg total) by mouth daily. 30 tablet 11  . etonogestrel-ethinyl estradiol (NUVARING) 0.12-0.015 MG/24HR vaginal ring INSERT VAGINALLY AND LEAVE IN PLACE FOR 3 CONSECUTIVE WEEKS, THEN REMOVE FOR 1 WEEK. (Patient not taking: Reported on 08/12/2014) 3 each 4  . ferrous sulfate (FERROUSUL) 325 (65 FE) MG tablet Take 1 tablet (325 mg total) by mouth 3 (three) times daily with meals. 90 tablet 3  . fluticasone (FLONASE) 50 MCG/ACT nasal spray Place 2 sprays into both nostrils daily. 16 g 6  . ibuprofen (ADVIL,MOTRIN) 100 MG tablet Take 100 mg by mouth every 6 (six) hours as needed for fever.    . sulfamethoxazole-trimethoprim (BACTRIM DS) 800-160 MG per tablet Take 1 tablet by mouth 2 (two) times daily. 6 tablet 0   No current facility-administered medications on file prior to visit.   History reviewed. No pertinent family history. Social History   Social History  . Marital Status: Married    Spouse Name: N/A  . Number of Children: N/A  . Years of Education: N/A   Occupational History  . Not  on file.   Social History Main Topics  . Smoking status: Never Smoker   . Smokeless tobacco: Never Used  . Alcohol Use: No  . Drug Use: No  . Sexual Activity: Not on file   Other Topics Concern  . Not on file   Social History Narrative    Review of Systems: Other than what is stated in HPI, all other systems are negative.   Objective:   Filed Vitals:   06/14/15 0956  BP: 124/91  Pulse: 83  Temp: 98 F (36.7 C)  Resp: 16    Physical Exam  Constitutional: She is oriented to person, place, and time.  HENT:  Left Ear: External ear normal.  Mouth/Throat: Oropharynx is clear and moist.  Swollen nasal turbinates Right ear effusion  Eyes: Right eye exhibits no discharge. Left eye exhibits no discharge.  Neck: Normal range of motion.  Cardiovascular: Normal rate, regular rhythm and normal heart sounds.   Pulmonary/Chest: Effort normal and breath sounds normal.  Neurological: She is alert and oriented to person, place, and time.  Skin: Skin is warm and dry.  Psychiatric: She has a normal mood and affect.      Lab Results  Component Value Date   WBC 13.2* 06/17/2014   HGB 13.7 06/17/2014   HCT 39.3 06/17/2014   MCV 82.2 06/17/2014   PLT 356 06/17/2014   Lab Results  Component  Value Date   CREATININE 0.54 05/20/2013   BUN 16 05/20/2013   NA 136 05/20/2013   K 4.1 05/20/2013   CL 104 05/20/2013   CO2 23 05/20/2013    Lab Results  Component Value Date   HGBA1C 5.5 05/06/2014   Lipid Panel  No results found for: CHOL, TRIG, HDL, CHOLHDL, VLDL, LDLCALC     Assessment and plan:   Nancy [name] was seen today for eye twitch.  Diagnoses and all orders for this visit:  Coryza virus -     fluticasone (FLONASE) 50 MCG/ACT nasal spray; Place 2 sprays into both nostrils daily. -     benzonatate (TESSALON) 200 MG capsule; Take 1 capsule (200 mg total) by mouth 2 (two) times daily as needed for cough. Symptom management. Return precautions given.  Dystonia Eyelid  twitching. She may use a warm cloth on the eye to relax the muscle. She should find ways to decrease stress and make sure she is getting proper amount of sleep/rest.   Anemia, unspecified anemia type -     CBC Will recheck today  Morbid obesity, unspecified obesity type (HCC) -     Lipid panel Weight loss discussed, exercise highly encouraged. Went over long term complications of obesity      Return if symptoms worsen or fail to improve.   Ambrose Finland, NP-C [facility] [phone] 06/14/2015, 10:12 AM

## 2015-06-18 ENCOUNTER — Telehealth: Payer: Self-pay

## 2015-06-18 ENCOUNTER — Telehealth: Payer: Self-pay | Admitting: Internal Medicine

## 2015-06-18 NOTE — Telephone Encounter (Signed)
Patient returned phone call, please f/u °

## 2015-06-18 NOTE — Telephone Encounter (Signed)
Pt. Returned call. Please f/u with pt. °

## 2015-06-18 NOTE — Telephone Encounter (Signed)
-----   Message from Ambrose FinlandValerie A Keck, NP sent at 06/16/2015  5:27 PM EST ----- Anemia has improved. Triglycerides are elevated. I want her to get some OTC Fish oil to take daily to help lower this.

## 2015-06-18 NOTE — Telephone Encounter (Signed)
Interpreter line used [place] [id number] Returned patient phone call Patient is aware of her lab results and to purchase some OTC Fish oil for her triglycerides

## 2015-06-18 NOTE — Telephone Encounter (Signed)
Interpreter line used Evette ID# [id number] Tried to contact patient  Patient not available Unable to leave message No voice mail set up

## 2015-07-02 ENCOUNTER — Other Ambulatory Visit: Payer: Self-pay

## 2015-07-02 ENCOUNTER — Other Ambulatory Visit: Payer: Self-pay | Admitting: Internal Medicine

## 2015-07-02 ENCOUNTER — Ambulatory Visit: Payer: Self-pay | Attending: Internal Medicine

## 2015-07-02 MED FILL — **NUVARING VAGINAL RING: 0.12-0.015 | 90 days supply | Qty: 3 | Fill #0

## 2015-07-02 NOTE — Telephone Encounter (Signed)
Pt. Called requesting a med refill on etonogestrel-ethinyl estradiol (NUVARING) 0.12-0.015 MG/24HR vaginal ring. Please f/u with pt.

## 2015-07-08 ENCOUNTER — Telehealth: Payer: Self-pay

## 2015-07-08 ENCOUNTER — Other Ambulatory Visit: Payer: Self-pay | Admitting: Internal Medicine

## 2015-07-08 MED ORDER — AZITHROMYCIN 250 MG PO TABS: ORAL_TABLET | ORAL | Status: DC

## 2015-07-08 NOTE — Telephone Encounter (Signed)
Interpreter line used Nancy [name] ID# [id number] Returned call to patient  Patient states at her last appointment she was having cold symptoms And was told if she did not get better to call and we would prescribe an antibiotic Patient is complaining of sinus pressure congestion and not feeling well Is this something you are willing to do?

## 2015-07-08 NOTE — Telephone Encounter (Signed)
Pt. Called requesting to speak to nurse regarding her Lab results. Please f/u with pt.

## 2015-07-08 NOTE — Telephone Encounter (Signed)
Med sent.

## 2015-07-09 ENCOUNTER — Telehealth: Payer: Self-pay

## 2015-07-09 MED FILL — ?AZITHROMYCIN 250 MG TABLET: 250 MG | 5 days supply | Qty: 6 | Fill #0

## 2015-07-09 NOTE — Telephone Encounter (Signed)
Interpreter line used Nancy Auerbachlfredo ID# [id number] Returned call to patient  And she is aware her doctor  [facility]

## 2015-07-13 ENCOUNTER — Encounter (HOSPITAL_COMMUNITY): Payer: Self-pay | Admitting: Family Medicine

## 2015-07-13 ENCOUNTER — Emergency Department (HOSPITAL_COMMUNITY)
Admission: EM | Admit: 2015-07-13 | Discharge: 2015-07-14 | Disposition: A | Discharge status: Home / Self Care | Payer: Self-pay | Attending: Emergency Medicine | Admitting: Emergency Medicine

## 2015-07-13 DIAGNOSIS — Z862 Personal history of diseases of the blood and blood-forming organs and certain disorders involving the immune mechanism: Secondary | ICD-10-CM | POA: Insufficient documentation

## 2015-07-13 DIAGNOSIS — Z880 Allergy status to penicillin: Secondary | ICD-10-CM | POA: Insufficient documentation

## 2015-07-13 DIAGNOSIS — Z8719 Personal history of other diseases of the digestive system: Secondary | ICD-10-CM | POA: Insufficient documentation

## 2015-07-13 DIAGNOSIS — J0101 Acute recurrent maxillary sinusitis: Secondary | ICD-10-CM | POA: Insufficient documentation

## 2015-07-13 DIAGNOSIS — Z7951 Long term (current) use of inhaled steroids: Secondary | ICD-10-CM | POA: Insufficient documentation

## 2015-07-13 NOTE — ED Notes (Signed)
Pt is complaining of headache in the frontal sinus area and occipital. Pain started last Monday. Denies blurry vision, lightheadedness, or vision problems. Pt spoke with PCP on Friday. Rx antibiotic for possible sinus infection. Symptoms have not improved. Last dose of IBUPROFEN was at 18:00.

## 2015-07-14 MED ORDER — DIPHENHYDRAMINE HCL 25 MG PO CAPS
50.0000 mg | ORAL_CAPSULE | Freq: Once | ORAL | Status: AC
  Administered 2015-07-14: 50 mg via ORAL
  Filled 2015-07-14: qty 2

## 2015-07-14 MED ORDER — PREDNISONE 20 MG PO TABS
60.0000 mg | ORAL_TABLET | Freq: Once | ORAL | Status: AC
  Administered 2015-07-14: 60 mg via ORAL
  Filled 2015-07-14: qty 3

## 2015-07-14 MED ORDER — PREDNISONE 10 MG PO TABS: ORAL_TABLET | ORAL | Status: DC

## 2015-07-14 NOTE — ED Provider Notes (Signed)
CSN: [id number]     Arrival date & time 07/13/15  2058 History   First MD Initiated Contact with Patient 07/14/15 0136     Chief Complaint  Patient presents with  . Headache     (Consider location/radiation/quality/duration/timing/severity/associated sxs/prior Treatment) Patient is a 35 y.o. female presenting with headaches. The history is provided by the patient. No language interpreter was used.  Headache Pain location:  Frontal and occipital Quality:  Dull Duration:  1 week Progression:  Worsening Chronicity:  Recurrent Similar to prior headaches: yes   Associated symptoms: congestion and sinus pressure   Associated symptoms: no cough, no fever, no nausea and no sore throat   Associated symptoms comment:  Patient complains of a pain over bilateral frontal area, maxillary sinus area, and bilateral occipital area for one week. She has congestion without sore throat or fever. No cough. She reports "sinus pain" she gets once a year but she states it is usually easy to control. She was given a Z-Pack and Flonase by her primary care provider but reports no relief and no change in her symptoms. No nausea or vomiting.   Past Medical History  Diagnosis Date  . Gallstones   . Anemia    Past Surgical History  Procedure Laterality Date  . Cesarean section    . Cholecystectomy N/A 10/13/2012    Procedure: LAPAROSCOPIC CHOLECYSTECTOMY WITH INTRAOPERATIVE CHOLANGIOGRAM;  Surgeon: Shelly Rubenstein, MD;  Location: [facility];  Service: General;  Laterality: N/A;   History reviewed. No pertinent family history. Social History  Substance Use Topics  . Smoking status: Never Smoker   . Smokeless tobacco: Never Used  . Alcohol Use: No   OB History    Gravida Para Term Preterm AB TAB SAB Ectopic Multiple Living   0 0 0 0 0 0 3     Review of Systems  Constitutional: Negative for fever and chills.  HENT: Positive for congestion and sinus pressure. Negative for sore throat and trouble  swallowing.   Respiratory: Negative.  Negative for cough.   Cardiovascular: Negative.   Gastrointestinal: Negative.  Negative for nausea.  Musculoskeletal: Negative.   Skin: Negative.   Neurological: Positive for headaches.      Allergies  Penicillins  Home Medications   Prior to Admission medications   Medication Sig Start Date End Date Taking? Authorizing Provider  fluticasone (FLONASE) 50 MCG/ACT nasal spray Place 2 sprays into both nostrils daily. 06/14/15  Yes Ambrose Finland, NP  ibuprofen (ADVIL,MOTRIN) 100 MG tablet Take 100 mg by mouth every 6 (six) hours as needed for fever.   Yes Historical Provider, MD  azithromycin (ZITHROMAX) 250 MG tablet Take 2 pills today, then 1 pill each day after until complete Patient not taking: Reported on 07/14/2015 07/08/15   Ambrose Finland, NP  benzonatate (TESSALON) 200 MG capsule Take 1 capsule (200 mg total) by mouth 2 (two) times daily as needed for cough. Patient not taking: Reported on 07/14/2015 06/14/15   Ambrose Finland, NP  cetirizine (ZYRTEC) 10 MG tablet Take 1 tablet (10 mg total) by mouth daily. Patient not taking: Reported on 07/14/2015 09/30/14   Ambrose Finland, NP  ferrous sulfate (FERROUSUL) 325 (65 FE) MG tablet Take 1 tablet (325 mg total) by mouth 3 (three) times daily with meals. Patient not taking: Reported on 07/14/2015 06/17/14   Ambrose Finland, NP  NUVARING 0.12-0.015 MG/24HR vaginal ring INSERT VAGINALLY AND LEAVE IN PLACE FOR 3 CONSECUTIVE WEEKS, THEN REMOVE  FOR 1 WEEK. Patient not taking: Reported on 07/14/2015 07/02/15   Ambrose Finland, NP   BP 131/91 mmHg  Pulse 79  Temp(Src) 97.6 F (36.4 C) (Oral)  Resp 18  Ht  (1.549 m)  Wt 115.667 kg  BMI 48.21 kg/m2  SpO2 97%  LMP 07/03/2015 Physical Exam  Constitutional: She is oriented to person, place, and time. She appears well-developed and well-nourished. She appears distressed.  HENT:  Head: Normocephalic.  Right Ear: External ear normal.  Left Ear: External  ear normal.  Nose: Mucosal edema present. Right sinus exhibits maxillary sinus tenderness and frontal sinus tenderness. Left sinus exhibits maxillary sinus tenderness and frontal sinus tenderness.  Mouth/Throat: Oropharynx is clear and moist.  Neck: Normal range of motion. Neck supple.  Cardiovascular: Normal rate and normal heart sounds.   No murmur heard. Pulmonary/Chest: Effort normal and breath sounds normal. She has no wheezes. She has no rales.  Abdominal: Soft. Bowel sounds are normal. She exhibits no distension. There is no tenderness.  Musculoskeletal: Normal range of motion.  Lymphadenopathy:    She has no cervical adenopathy.  Neurological: She is alert and oriented to person, place, and time.  Skin: Skin is warm and dry. No pallor.    ED Course  Procedures (including critical care time) Labs Review Labs Reviewed - No data to display  Imaging Review No results found. I have personally reviewed and evaluated these images and lab results as part of my medical decision-making.   EKG Interpretation None      MDM   Final diagnoses:  None    1. Sinusitis  Will try a taper dose steroid, plain saline nasal sprays and Zyrtec. She reports relief with 400 mg ibuprofen but that the pain recurs when the medication wears off. Encouraged continued with ibuprofen. She has an appointment with her doctor in 6 days for recheck.     Elpidio Anis, PA-C 07/14/15 0210  Tilden Fossa, MD 07/15/15 2017

## 2015-07-14 NOTE — Discharge Instructions (Signed)
TAKE ZYRTEC DAILY AND TAKE PREDNISONE AS PRESCRIBED. RECOMMEND CONTINUATION OF IBUPROFEN AND USE OF PLAIN SALINE NASAL SPRAY FOR FURTHER SYMPTOM RELIEF. KEEP APPOINTMENT NEXT WEEK WITH YOUR DOCTOR AND RETURN HERE AS NEEDED FOR WORSENING SYMPTOMS OR NEW CONCERN.   Sinusitis en adultos (Sinusitis, Adult) La sinusitis es el enrojecimiento, el dolor y la inflamacin de los senos paranasales. Los senos paranasales son cavidades de aire que estn dentro de los huesos de la cara. Se encuentran debajo de los ojos, en la mitad de la frente y encima de los ojos. En los senos paranasales sanos, el moco puede drenar y el aire circula a travs de ellos en su camino hacia la Clinical cytogeneticist. Sin embargo, cuando se Arnold, el moco y el aire Karns. Esto hace que se desarrollen bacterias y otros grmenes que causan infeccin. La sinusitis puede desarrollarse rpidamente y durar solo un tiempo corto (aguda) o continuar por un perodo largo (crnica). La sinusitis que dura ms de 12 semanas se considera crnica. CAUSAS Las causas de la sinusitis son:  Bryan.  Las [employer], como el desplazamiento del cartlago que separa las fosas nasales (desvo del tabique), que puede disminuir el flujo de aire por la nariz y los senos paranasales, y Audiological scientist su drenaje.  Las anomalas funcionales, como cuando los pequeos pelos (cilias) que se encuentran en los senos paranasales y ayudan a eliminar el moco no funcionan correctamente o no estn presentes. SIGNOS Y SNTOMAS Los sntomas de la sinusitis aguda y [place] son los mismos. Los sntomas principales son Chief Technology Officer y la presin alrededor de los senos paranasales afectados. Otros sntomas son:  Careers information officer.  Dolor de odos.  Dolor de [place].  Mal aliento.  Disminucin del sentido del olfato y del gusto.  Tos, que empeora al [employer]  Fatiga.  Grant Ruts.  Drenaje de moco espeso por la nariz, que generalmente es de color verde  y puede contener pus (purulento).  Hinchazn y calor en los senos paranasales afectados. DIAGNSTICO Su mdico le Medical sales representative examen fsico. Durante el examen, el mdico puede hacer cualquiera de estas cosas para determinar si usted tiene sinusitis aguda o crnica:  Le revisar la nariz para buscar signos de crecimientos anormales en las fosas nasales (plipos nasales).  Palpar los senos paranasales afectados para buscar signos de infeccin.  Observar la parte interna de los senos paranasales con un dispositivo que tiene una luz (endoscopio). Si el mdico sospecha que usted sufre sinusitis crnica, podr indicar una o ms de las siguientes pruebas:  Pruebas de Programmer, multimedia.  Cultivo de las Yahoo. Se extrae Lauris Poag de moco de la nariz, que se enva al laboratorio para detectar bacterias.  Citologa nasal. Se extrae Lauris Poag de moco de la nariz, que el mdico examina para determinar si la sinusitis est relacionada con Vella Raring. TRATAMIENTO La mayora de los casos de sinusitis aguda se deben a una infeccin viral y se resuelven espontneamente en un perodo de 10das. A veces, se recetan medicamentos como ayuda para [employer] sntomas tanto de la sinusitis aguda como de la crnica. Estos pueden incluir analgsicos, descongestivos, aerosoles nasales con corticoides o aerosoles de solucin salina. Sin embargo, para la sinusitis por infeccin bacteriana, Chief Operating Officer. Los antibiticos son medicamentos que destruyen las bacterias que causan la infeccin. Con poca frecuencia, la sinusitis tiene su origen en una infeccin por hongos. En estos casos, Actor un medicamento antimictico. Para algunos casos de sinusitis crnica, es  necesario someterse a [place]. Generalmente, se trata de [employer] la sinusitis se repite ms de 3veces al ao, a pesar de otros tratamientos. INSTRUCCIONES PARA EL CUIDADO EN EL HOGAR  Beber abundante  agua. Los lquidos ayudan a [employer], para que drene ms fcilmente de los senos paranasales.  Use un humidificador.  Inhale vapor de 3a 4veces al da (por ejemplo, sintese en el bao con la [place]).  Aplquese un pao tibio y hmedo en la cara 3 o 4veces al da o segn las indicaciones del mdico.  Use un aerosol nasal salino para ayudar a Environmental education officer y [employer] senos nasales.  Tome los medicamentos solamente como se lo haya indicado el mdico.  Si le recetaron un antimictico o un antibitico, asegrese de terminarlos, incluso si comienza a sentirse mejor. SOLICITE ATENCIN MDICA DE INMEDIATO SI:  Siente ms dolor o sufre dolores de cabeza intensos.  Tiene nuseas, vmitos o somnolencia.  Observa hinchazn alrededor del rostro.  Tiene problemas de visin.  Presenta rigidez en el cuello.  Tiene dificultad para respirar.   Esta informacin no tiene Theme park manager el consejo del mdico. Asegrese de hacerle al mdico cualquier pregunta que tenga.   Document Released: 03/22/2005 Document Revised: 07/03/2014 Elsevier Interactive Patient Education [employer]

## 2015-07-15 MED FILL — ?PREDNISONE 10 MG TABLET: 10 | 5 days supply | Qty: 15 | Fill #0

## 2015-07-21 ENCOUNTER — Ambulatory Visit: Payer: Self-pay | Attending: Internal Medicine | Admitting: Internal Medicine

## 2015-07-21 ENCOUNTER — Encounter: Payer: Self-pay | Admitting: Internal Medicine

## 2015-07-21 ENCOUNTER — Telehealth: Payer: Self-pay

## 2015-07-21 VITALS — BP 129/89 | HR 94 | Temp 98.0°F | Resp 16 | Ht 61.0 in | Wt 273.4 lb

## 2015-07-21 DIAGNOSIS — K0889 Other specified disorders of teeth and supporting structures: Secondary | ICD-10-CM

## 2015-07-21 DIAGNOSIS — Z0000 Encounter for general adult medical examination without abnormal findings: Secondary | ICD-10-CM | POA: Insufficient documentation

## 2015-07-21 DIAGNOSIS — J349 Unspecified disorder of nose and nasal sinuses: Secondary | ICD-10-CM

## 2015-07-21 NOTE — Progress Notes (Signed)
Patient here for follow up on her sinus infection Finished the prednisone two days ago Still complains of congestion and headache

## 2015-07-21 NOTE — Patient Instructions (Signed)
Call me when approved for financial program and I will send referral to ENT doctor.

## 2015-07-21 NOTE — Telephone Encounter (Signed)
Called patient to find out the nature of today's visit being she  Was just seen in the ED Patient wants to be seen by her provider

## 2015-07-21 NOTE — Progress Notes (Signed)
   Subjective:    Patient ID: Nancy [name], female    DOB: March 31, 1981, 35 y.o.   MRN: [id number]  HPI Comments: Patient was treated with Z-pack last month. Presented to ED last week with similar sinus problems and was given Prednisone and Ibuprofen.   Sinus Problem This is a recurrent problem. The current episode started in the past 7 days. The problem is unchanged. There has been no fever. The pain is moderate. Associated symptoms include congestion, headaches and sinus pressure. Pertinent negatives include no coughing, sneezing or sore throat. Past treatments include saline sprays, antibiotics and oral decongestants. The treatment provided no relief.    Review of Systems  HENT: Positive for congestion, dental problem and sinus pressure. Negative for sneezing and sore throat.   Respiratory: Negative for cough.   Neurological: Positive for headaches.  All other systems reviewed and are negative.      Objective:   Physical Exam  Constitutional: She is oriented to person, place, and time.  HENT:  Right Ear: External ear normal.  Left Ear: External ear normal.  Eyes: Right eye exhibits no discharge. Left eye exhibits no discharge.  Lymphadenopathy:    She has no cervical adenopathy.  Neurological: She is alert and oriented to person, place, and time.  Skin: Skin is warm and dry.  Psychiatric: She has a normal mood and affect.      Assessment & Plan:  Nancy [name] was seen today for follow-up.  Diagnoses and all orders for this visit:  Sinus problem I have referred patient to ENT in the past but she has been unable to go due to lack of insurance.   Pain, dental -     Ambulatory referral to Dentistry  Return if symptoms worsen or fail to improve.  Ambrose Finland, NP 07/21/2015 4:23 PM

## 2015-08-03 ENCOUNTER — Telehealth: Payer: Self-pay | Admitting: Internal Medicine

## 2015-08-03 NOTE — Telephone Encounter (Signed)
Pt. Called to ask the PCP when was the last time her DM was checked. Pt. Stated she went to another doctor and he stated that the pt. Has DM. Please f/u with pt.

## 2015-08-03 NOTE — Telephone Encounter (Signed)
Interpreter line used Nancy [name] ID# [id number] Returned patient phone call Patient was concerned  When she was seen at the ED dept. Her blood sugar was at 318 and they covered her with insulin Patient had eaten at the time and was on prednisone  The ED also prescribe metformin and the patient is a little leery about taking the Medication Patient has an appointment scheduled next week with her provider Call was transferred to front desk to see if we can get her in sooner with stacey to check her A1C

## 2015-08-04 ENCOUNTER — Other Ambulatory Visit: Payer: Self-pay | Admitting: Pharmacist

## 2015-08-04 ENCOUNTER — Ambulatory Visit: Payer: Self-pay | Admitting: Pharmacist

## 2015-08-04 ENCOUNTER — Telehealth: Payer: Self-pay

## 2015-08-04 ENCOUNTER — Ambulatory Visit: Payer: Self-pay | Attending: Internal Medicine

## 2015-08-04 DIAGNOSIS — Z0000 Encounter for general adult medical examination without abnormal findings: Secondary | ICD-10-CM

## 2015-08-04 LAB — POCT GLYCOSYLATED HEMOGLOBIN (HGB A1C): Hemoglobin A1C: 7.7

## 2015-08-04 NOTE — Telephone Encounter (Signed)
Interpreter line used christain ID# [id number] Tried to contact patient  Patient not available Message left with her mother to have her return our call

## 2015-08-04 NOTE — Telephone Encounter (Signed)
Interpreter line used Onalee Hua ID# [id number] Returned patient phone call Patient is aware her A1C showed she is a diabetic Patient understands to take the metformin as prescribe and we will  Discuss this at her office visit next week Patient stated when she started taking the metformin it was causing her to have foot pain and States she prefers not to take it Patient states she will work on eating healthier and reducing her carbs

## 2015-08-04 NOTE — Telephone Encounter (Signed)
Patient returned nurse phone call PLease follow up.

## 2015-08-04 NOTE — Telephone Encounter (Signed)
Pt. Returned call. Please f/u with pt. °

## 2015-08-11 ENCOUNTER — Ambulatory Visit: Payer: Self-pay | Admitting: Internal Medicine

## 2015-08-18 ENCOUNTER — Ambulatory Visit: Payer: Self-pay | Admitting: Internal Medicine

## 2016-08-21 ENCOUNTER — Ambulatory Visit: Payer: Self-pay | Admitting: Family Medicine

## 2016-08-21 NOTE — Progress Notes (Deleted)
Subjective:  Patient ID: [name], female    DOB: December 10, 1980  Age: 36 y.o. MRN: [id number]  CC: No chief complaint on file.   HPI Briseyda B Mallet presents for       Outpatient Medications Prior to Visit  Medication Sig Dispense Refill  . azithromycin (ZITHROMAX) 250 MG tablet Take 2 pills today, then 1 pill each day after until complete (Patient not taking: Reported on 07/14/2015) 6 tablet 0  . benzonatate (TESSALON) 200 MG capsule Take 1 capsule (200 mg total) by mouth 2 (two) times daily as needed for cough. (Patient not taking: Reported on 07/14/2015) 30 capsule 1  . cetirizine (ZYRTEC) 10 MG tablet Take 1 tablet (10 mg total) by mouth daily. (Patient not taking: Reported on 07/14/2015) 30 tablet 11  . ferrous sulfate (FERROUSUL) 325 (65 FE) MG tablet Take 1 tablet (325 mg total) by mouth 3 (three) times daily with meals. (Patient not taking: Reported on 07/14/2015) 90 tablet 3  . fluticasone (FLONASE) 50 MCG/ACT nasal spray Place 2 sprays into both nostrils daily. 16 g 6  . ibuprofen (ADVIL,MOTRIN) 100 MG tablet Take 100 mg by mouth every 6 (six) hours as needed for fever.    Marland Kitchen NUVARING 0.12-0.015 MG/24HR vaginal ring INSERT VAGINALLY AND LEAVE IN PLACE FOR 3 CONSECUTIVE WEEKS, THEN REMOVE FOR 1 WEEK. (Patient not taking: Reported on 07/14/2015) 3 each 4  . predniSONE (DELTASONE) 10 MG tablet Take 5 on day 2 (day one provided in emergency department) Take 4 on day 3 Take 3 on day 4 Take 2 on day 5 Take 1 on day 6 (Patient not taking: Reported on 07/21/2015) 15 tablet 0   No facility-administered medications prior to visit.     ROS Review of Systems      Objective:  There were no vitals taken for this visit.  BP/Weight 07/21/2015 07/14/2015 07/13/2015  Systolic BP 129 131 -  Diastolic BP 89 91 -  Wt. (Lbs) 273.4 - 255  BMI 51.68 - 48.21     Physical Exam   Assessment & Plan:   Problem List Items Addressed This Visit    None      No orders of the defined  types were placed in this encounter.   Follow-up: No Follow-up on file.   Lizbeth Bark FNP

## 2016-08-28 ENCOUNTER — Ambulatory Visit (INDEPENDENT_AMBULATORY_CARE_PROVIDER_SITE_OTHER): Payer: Self-pay | Admitting: Physician Assistant

## 2016-09-05 ENCOUNTER — Ambulatory Visit (INDEPENDENT_AMBULATORY_CARE_PROVIDER_SITE_OTHER): Payer: Self-pay | Admitting: Physician Assistant

## 2017-02-01 ENCOUNTER — Ambulatory Visit (INDEPENDENT_AMBULATORY_CARE_PROVIDER_SITE_OTHER): Payer: Self-pay | Admitting: Physician Assistant

## 2017-02-01 ENCOUNTER — Encounter (INDEPENDENT_AMBULATORY_CARE_PROVIDER_SITE_OTHER): Payer: Self-pay | Admitting: Physician Assistant

## 2017-02-01 VITALS — BP 125/82 | HR 71 | Temp 97.7°F | Ht 62.5 in | Wt 233.2 lb

## 2017-02-01 DIAGNOSIS — N920 Excessive and frequent menstruation with regular cycle: Secondary | ICD-10-CM

## 2017-02-01 DIAGNOSIS — Z131 Encounter for screening for diabetes mellitus: Secondary | ICD-10-CM

## 2017-02-01 DIAGNOSIS — N921 Excessive and frequent menstruation with irregular cycle: Secondary | ICD-10-CM

## 2017-02-01 DIAGNOSIS — R0982 Postnasal drip: Secondary | ICD-10-CM

## 2017-02-01 DIAGNOSIS — N939 Abnormal uterine and vaginal bleeding, unspecified: Secondary | ICD-10-CM

## 2017-02-01 DIAGNOSIS — E669 Obesity, unspecified: Secondary | ICD-10-CM

## 2017-02-01 LAB — POCT URINE PREGNANCY: Preg Test, Ur: NEGATIVE

## 2017-02-01 LAB — POCT GLYCOSYLATED HEMOGLOBIN (HGB A1C): Hemoglobin A1C: 5.5

## 2017-02-01 MED ORDER — LORATADINE 10 MG PO TABS: 10.0000 mg | ORAL_TABLET | Freq: Every day | ORAL | 11 refills | Status: DC

## 2017-02-01 MED ORDER — FLUTICASONE PROPIONATE 50 MCG/ACT NA SUSP: 2.0000 | Freq: Every day | NASAL | 6 refills | Status: DC

## 2017-02-01 NOTE — Patient Instructions (Signed)
Dysfunctional Uterine Bleeding °Dysfunctional uterine bleeding is abnormal bleeding from the uterus. Dysfunctional uterine bleeding includes: °· A period that comes earlier or later than usual. °· A period that is lighter, heavier, or has blood clots. °· Bleeding between periods. °· Skipping one or more periods. °· Bleeding after sexual intercourse. °· Bleeding after menopause. ° °Follow these instructions at home: °Pay attention to any changes in your symptoms. Follow these instructions to help with your condition: °Eating and drinking °· Eat well-balanced meals. Include foods that are high in iron, such as liver, meat, shellfish, green leafy vegetables, and eggs. °· If you become constipated: °? Drink plenty of water. °? Eat fruits and vegetables that are high in water and fiber, such as spinach, carrots, raspberries, apples, and mango. °Medicines °· Take over-the-counter and prescription medicines only as told by your health care provider. °· Do not change medicines without talking with your health care provider. °· Aspirin or medicines that contain aspirin may make the bleeding worse. Do not take those medicines: °? During the week before your period. °? During your period. °· If you were prescribed iron pills, take them as told by your health care provider. Iron pills help to replace iron that your body loses because of this condition. °Activity °· If you need to change your sanitary pad or tampon more than one time every 2 hours: °? Lie in bed with your feet raised (elevated). °? Place a cold pack on your lower abdomen. °? Rest as much as possible until the bleeding stops or slows down. °· Do not try to lose weight until the bleeding has stopped and your blood iron level is back to normal. °Other Instructions °· For two months, write down: °? When your period starts. °? When your period ends. °? When any abnormal bleeding occurs. °? What problems you notice. °· Keep all follow up visits as told by your health  care provider. This is important. °Contact a health care provider if: °· You get light-headed or weak. °· You have nausea and vomiting. °· You cannot eat or drink without vomiting. °· You feel dizzy or have diarrhea while you are taking medicines. °· You are taking birth control pills or hormones, and you want to change them or stop taking them. °Get help right away if: °· You develop a fever or chills. °· You need to change your sanitary pad or tampon more than one time per hour. °· Your bleeding becomes heavier, or your flow contains clots more often. °· You develop pain in your abdomen. °· You lose consciousness. °· You develop a rash. °This information is not intended to replace advice given to you by your health care provider. Make sure you discuss any questions you have with your health care provider. °Document Released: 06/09/2000 Document Revised: 11/18/2015 Document Reviewed: 09/07/2014 °Elsevier Interactive Patient Education © 2018 Elsevier Inc. ° °

## 2017-02-01 NOTE — Progress Notes (Signed)
Subjective:  Patient ID: Nancy [name], female    DOB: 1981/06/05  Age: 36 y.o. MRN: [id number]  CC:   HPI Nancy [name] is a 36 y.o. female with a PMH of panic attacks, nephrolithiasis, anemia, and cholelithiasis presents with concern for vaginal spotting. Has noted spotting with bicycling and sexual intercourse. No dyspareunia. Had two episodes in two weeks. Menstrual cycle is irregular in timing and flow. Had US pelvic and transvaginal 3 years ago with finding of small fibroids. Was prescribed Nexplanon for regulation of menses but patient discontinued because of hirsutism.     Also complains of constant nasal congestion. Attributed to working in housekeeping and also to moldy conditions in her previous residence one month ago. Has sinus congestion with occasional maxillary sinus pain. Sometimes feels ear fullness bilaterally. Blows her nose and sees "black dust". No f/c/n/v, headache, sore throat, eye pain, or ear pain. Does not endorse any other symptom or complaint.   Outpatient Medications Prior to Visit  Medication Sig Dispense Refill  . azithromycin (ZITHROMAX) 250 MG tablet Take 2 pills today, then 1 pill each day after until complete (Patient not taking: Reported on 07/14/2015) 6 tablet 0  . benzonatate (TESSALON) 200 MG capsule Take 1 capsule (200 mg total) by mouth 2 (two) times daily as needed for cough. (Patient not taking: Reported on 07/14/2015) 30 capsule 1  . cetirizine (ZYRTEC) 10 MG tablet Take 1 tablet (10 mg total) by mouth daily. (Patient not taking: Reported on 07/14/2015) 30 tablet 11  . ferrous sulfate (FERROUSUL) 325 (65 FE) MG tablet Take 1 tablet (325 mg total) by mouth 3 (three) times daily with meals. (Patient not taking: Reported on 07/14/2015) 90 tablet 3  . fluticasone (FLONASE) 50 MCG/ACT nasal spray Place 2 sprays into both nostrils daily. 16 g 6  . ibuprofen (ADVIL,MOTRIN) 100 MG tablet Take 100 mg by mouth every 6 (six) hours as needed for fever.    Marland Kitchen  NUVARING 0.12-0.015 MG/24HR vaginal ring INSERT VAGINALLY AND LEAVE IN PLACE FOR 3 CONSECUTIVE WEEKS, THEN REMOVE FOR 1 WEEK. (Patient not taking: Reported on 07/14/2015) 3 each 4  . predniSONE (DELTASONE) 10 MG tablet Take 5 on day 2 (day one provided in emergency department) Take 4 on day 3 Take 3 on day 4 Take 2 on day 5 Take 1 on day 6 (Patient not taking: Reported on 07/21/2015) 15 tablet 0   No facility-administered medications prior to visit.      ROS Review of Systems  Constitutional: Negative for chills, fever and malaise/fatigue.  HENT: Positive for congestion and sinus pain. Negative for ear discharge, ear pain and sore throat.   Eyes: Negative for blurred vision.  Respiratory: Negative for shortness of breath.   Cardiovascular: Negative for chest pain and palpitations.  Gastrointestinal: Negative for abdominal pain and nausea.  Genitourinary: Negative for dysuria and hematuria.       Irregular menses  Musculoskeletal: Negative for joint pain and myalgias.  Skin: Negative for rash.  Neurological: Negative for tingling and headaches.  Psychiatric/Behavioral: Negative for depression. The patient is not nervous/anxious.     Objective:  BP 125/82 (BP Location: Right Arm, Patient Position: Sitting, Cuff Size: Large)   Pulse 71   Temp 97.7 F (36.5 C) (Oral)   Ht 5' 2.5" (1.588 m)   Wt 233 lb 3.2 oz (105.8 kg)   LMP 01/17/2017 (Approximate)   SpO2 98%   BMI 41.97 kg/m   BP/Weight 02/01/2017 07/21/2015 07/14/2015  Systolic BP  125 129 131  Diastolic BP 82 89 91  Wt. (Lbs) 233.2 273.4 -  BMI 41.97 51.68 -      Physical Exam  Constitutional: She is oriented to person, place, and time.  Well developed, obese, NAD, polite  HENT:  Head: Normocephalic and atraumatic.  Eyes: No scleral icterus.  Neck: Normal range of motion. Neck supple. No thyromegaly present.  Cardiovascular: Normal rate, regular rhythm and normal heart sounds.   Pulmonary/Chest: Effort normal and  breath sounds normal.  Musculoskeletal: She exhibits no edema.  Lymphadenopathy:    She has no cervical adenopathy.  Neurological: She is alert and oriented to person, place, and time. No cranial nerve deficit. Coordination normal.  Skin: Skin is warm and dry. No rash noted. No erythema. No pallor.  Psychiatric: She has a normal mood and affect. Her behavior is normal. Thought content normal.  Vitals reviewed.    Assessment & Plan:     1. Menometrorrhagia - TSH - CBC with Differential - US Pelvis Complete; Future - Comprehensive metabolic panel - US Transvaginal Non-OB; Future - POCT urine pregnancy  2. Obesity, unspecified classification, unspecified obesity type, unspecified whether serious comorbidity present - TSH - CBC with Differential - US Pelvis Complete; Future - Comprehensive metabolic panel - US Transvaginal Non-OB; Future - POCT urine pregnancy negative in clinic today  3. Vaginal spotting - US Pelvis Complete; Future - US Transvaginal Non-OB; Future - POCT urine pregnancy negative in clinic today  4. Screening for diabetes mellitu - HgB A1c 5.5% in clinic today.  5. Postnasal discharge - fluticasone (FLONASE) 50 MCG/ACT nasal spray; Place 2 sprays into both nostrils daily.  Dispense: 16 g; Refill: 6 - Allergens Zone 2 - loratadine (CLARITIN) 10 MG tablet; Take 1 tablet (10 mg total) by mouth daily.  Dispense: 30 tablet; Refill: 11   Meds ordered this encounter  Medications  . fluticasone (FLONASE) 50 MCG/ACT nasal spray    Sig: Place 2 sprays into both nostrils daily.    Dispense:  16 g    Refill:  6    Order Specific Question:   Supervising Provider    Answer:   Quentin Angst [id number]  . loratadine (CLARITIN) 10 MG tablet    Sig: Take 1 tablet (10 mg total) by mouth daily.    Dispense:  30 tablet    Refill:  11    Order Specific Question:   Supervising Provider    Answer:   Quentin Angst [[id number]]    Follow-up:  6  weeks  Loletta Specter PA

## 2017-02-04 LAB — IGE+ALLERGENS ZONE 2(30)

## 2017-02-04 LAB — CBC WITH DIFFERENTIAL/PLATELET
Basophils Absolute: 0 10*3/uL (ref 0.0–0.2)
Basos: 0 %
EOS (ABSOLUTE): 0.6 10*3/uL — ABNORMAL HIGH (ref 0.0–0.4)
Eos: 5 %
Hematocrit: 34.4 % (ref 34.0–46.6)
Hemoglobin: 10.8 g/dL — ABNORMAL LOW (ref 11.1–15.9)
Immature Grans (Abs): 0 10*3/uL (ref 0.0–0.1)
Immature Granulocytes: 0 %
Lymphocytes Absolute: 3.4 10*3/uL — ABNORMAL HIGH (ref 0.7–3.1)
Lymphs: 29 %
MCH: 26.3 pg — ABNORMAL LOW (ref 26.6–33.0)
MCHC: 31.4 g/dL — ABNORMAL LOW (ref 31.5–35.7)
MCV: 84 fL (ref 79–97)
Monocytes Absolute: 0.7 10*3/uL (ref 0.1–0.9)
Monocytes: 6 %
Neutrophils Absolute: 6.9 10*3/uL (ref 1.4–7.0)
Neutrophils: 60 %
Platelets: 324 10*3/uL (ref 150–379)
RBC: 4.11 x10E6/uL (ref 3.77–5.28)
RDW: 16.1 % — ABNORMAL HIGH (ref 12.3–15.4)
WBC: 11.6 10*3/uL — ABNORMAL HIGH (ref 3.4–10.8)

## 2017-02-04 LAB — COMPREHENSIVE METABOLIC PANEL
ALT: 14 IU/L (ref 0–32)
AST: 19 IU/L (ref 0–40)
Albumin/Globulin Ratio: 1.3 (ref 1.2–2.2)
Albumin: 4.3 g/dL (ref 3.5–5.5)
Alkaline Phosphatase: 98 IU/L (ref 39–117)
BUN/Creatinine Ratio: 18 (ref 9–23)
BUN: 10 mg/dL (ref 6–20)
Bilirubin Total: 0.5 mg/dL (ref 0.0–1.2)
CO2: 23 mmol/L (ref 20–29)
Calcium: 8.9 mg/dL (ref 8.7–10.2)
Chloride: 104 mmol/L (ref 96–106)
Creatinine, Ser: 0.57 mg/dL (ref 0.57–1.00)
GFR calc Af Amer: 138 mL/min/{1.73_m2} (ref >59)
GFR calc non Af Amer: 120 mL/min/{1.73_m2} (ref >59)
Globulin, Total: 3.3 g/dL (ref 1.5–4.5)
Glucose: 85 mg/dL (ref 65–99)
Potassium: 4.3 mmol/L (ref 3.5–5.2)
Sodium: 138 mmol/L (ref 134–144)
Total Protein: 7.6 g/dL (ref 6.0–8.5)

## 2017-02-04 LAB — TSH: TSH: 1.81 u[IU]/mL (ref 0.450–4.500)

## 2017-02-07 ENCOUNTER — Ambulatory Visit (HOSPITAL_COMMUNITY)
Admission: RE | Admit: 2017-02-07 | Discharge: 2017-02-07 | Disposition: A | Discharge status: Home / Self Care | Payer: Self-pay | Source: Ambulatory Visit | Attending: Physician Assistant | Admitting: Physician Assistant

## 2017-02-07 DIAGNOSIS — N858 Other specified noninflammatory disorders of uterus: Secondary | ICD-10-CM | POA: Insufficient documentation

## 2017-02-07 DIAGNOSIS — N921 Excessive and frequent menstruation with irregular cycle: Secondary | ICD-10-CM | POA: Insufficient documentation

## 2017-02-07 DIAGNOSIS — N939 Abnormal uterine and vaginal bleeding, unspecified: Secondary | ICD-10-CM

## 2017-02-07 DIAGNOSIS — E669 Obesity, unspecified: Secondary | ICD-10-CM | POA: Insufficient documentation

## 2017-02-08 ENCOUNTER — Other Ambulatory Visit (INDEPENDENT_AMBULATORY_CARE_PROVIDER_SITE_OTHER): Payer: Self-pay | Admitting: Physician Assistant

## 2017-02-08 DIAGNOSIS — R935 Abnormal findings on diagnostic imaging of other abdominal regions, including retroperitoneum: Secondary | ICD-10-CM

## 2017-02-12 ENCOUNTER — Encounter (INDEPENDENT_AMBULATORY_CARE_PROVIDER_SITE_OTHER): Payer: Self-pay | Admitting: Physician Assistant

## 2017-02-12 ENCOUNTER — Ambulatory Visit (INDEPENDENT_AMBULATORY_CARE_PROVIDER_SITE_OTHER): Payer: Self-pay | Admitting: Physician Assistant

## 2017-02-12 VITALS — BP 109/73 | HR 82 | Temp 98.3°F | Resp 18 | Ht 62.0 in | Wt 223.0 lb

## 2017-02-12 DIAGNOSIS — F411 Generalized anxiety disorder: Secondary | ICD-10-CM

## 2017-02-12 DIAGNOSIS — R938 Abnormal findings on diagnostic imaging of other specified body structures: Secondary | ICD-10-CM

## 2017-02-12 DIAGNOSIS — R350 Frequency of micturition: Secondary | ICD-10-CM

## 2017-02-12 DIAGNOSIS — R9389 Abnormal findings on diagnostic imaging of other specified body structures: Secondary | ICD-10-CM

## 2017-02-12 LAB — POCT URINALYSIS DIPSTICK
Bilirubin, UA: NEGATIVE
Glucose, UA: NEGATIVE
Ketones, UA: NEGATIVE
Leukocytes, UA: NEGATIVE
Nitrite, UA: NEGATIVE
Protein, UA: NEGATIVE
Spec Grav, UA: 1.01 (ref 1.010–1.025)
Urobilinogen, UA: 0.2 E.U./dL
pH, UA: 6.5 (ref 5.0–8.0)

## 2017-02-12 LAB — POCT URINE PREGNANCY: Preg Test, Ur: NEGATIVE

## 2017-02-12 MED ORDER — CLONAZEPAM 0.5 MG PO TABS: 0.5000 mg | ORAL_TABLET | Freq: Two times a day (BID) | ORAL | 0 refills | Status: DC | PRN

## 2017-02-12 MED ORDER — ESCITALOPRAM OXALATE 10 MG PO TABS: 10.0000 mg | ORAL_TABLET | Freq: Every day | ORAL | 2 refills | Status: DC

## 2017-02-12 NOTE — Progress Notes (Signed)
Subjective:  Patient ID: Nancy [name], female    DOB: Mar 14, 1981  Age: 36 y.o. MRN: [id number]  CC: urinary frequency  HPI Nancy [name] is a 36 y.o. female with a PMH of panic attacks, nephrolithiasis, anemia, and cholelithiasis presents to discuss urinary frequency and menomettrorrhagia.  She was seen here 11 days ago for menometrorrhagia and had US pelvic/transvag ordered. Findings on US revealed 18.2 mm endometrial stripe. In regards to urinary frequency, onset was a few days ago.    Patient has been experiencing panic attacks recently. Onset of anxiety and panic attacks more than 10 years ago. Previously on pharmacotherapy but has stopped because she did not want to become addicted to the medication. She relies on church and prayer to help with her anxiety and panic attacks. Reports eventual alleviation of anxiety attack with prayer. Brother and father both suffer with anxiety and panic attacks.     Outpatient Medications Prior to Visit  Medication Sig Dispense Refill  . fluticasone (FLONASE) 50 MCG/ACT nasal spray Place 2 sprays into both nostrils daily. 16 g 6  . ibuprofen (ADVIL,MOTRIN) 100 MG tablet Take 100 mg by mouth every 6 (six) hours as needed for fever.    . loratadine (CLARITIN) 10 MG tablet Take 1 tablet (10 mg total) by mouth daily. 30 tablet 11   No facility-administered medications prior to visit.      ROS Review of Systems  Constitutional: Negative for chills, fever and malaise/fatigue.  Eyes: Negative for blurred vision.  Respiratory: Negative for shortness of breath.   Cardiovascular: Negative for chest pain and palpitations.  Gastrointestinal: Negative for abdominal pain and nausea.  Genitourinary: Positive for frequency. Negative for dysuria and hematuria.  Musculoskeletal: Negative for joint pain and myalgias.  Skin: Negative for rash.  Neurological: Negative for tingling and headaches.  Psychiatric/Behavioral: Negative for depression. The patient  is nervous/anxious.     Objective:  LMP 01/17/2017 (Approximate)   BP/Weight 02/01/2017 07/21/2015 07/14/2015  Systolic BP 125 129 131  Diastolic BP 82 89 91  Wt. (Lbs) 233.2 273.4 -  BMI 41.97 51.68 -      Physical Exam  Constitutional: She is oriented to person, place, and time.  Well developed, obese, NAD, polite  HENT:  Head: Normocephalic and atraumatic.  Eyes: No scleral icterus.  Neck: Normal range of motion. Neck supple. No thyromegaly present.  Cardiovascular: Normal rate, regular rhythm and normal heart sounds.   Pulmonary/Chest: Effort normal and breath sounds normal.  Abdominal: Soft. Bowel sounds are normal. There is no tenderness.  Musculoskeletal: She exhibits no edema.  Neurological: She is alert and oriented to person, place, and time. No cranial nerve deficit. Coordination normal.  Skin: Skin is warm and dry. No rash noted. No erythema. No pallor.  Psychiatric: She has a normal mood and affect. Her behavior is normal. Thought content normal.  Vitals reviewed.    Assessment & Plan:    1. Generalized anxiety disorder - Begin escitalopram (LEXAPRO) 10 MG tablet; Take 1 tablet (10 mg total) by mouth daily.  Dispense: 30 tablet; Refill: 2 - Begin clonazePAM (KLONOPIN) 0.5 MG tablet; Take 1 tablet (0.5 mg total) by mouth 2 (two) times daily as needed for anxiety.  Dispense: 15 tablet; Refill: 0 - Ambulatory referral to Psychiatry  2. Urinary frequency - Urinalysis Dipstick with large blood in clinic today. - POCT urine pregnancy negative.   3. Abnormal pelvic ultrasound - Ambulatory referral to Gynecology - [facility]   Meds ordered this encounter  Medications  . escitalopram (LEXAPRO) 10 MG tablet    Sig: Take 1 tablet (10 mg total) by mouth daily.    Dispense:  30 tablet    Refill:  2    Order Specific Question:   Supervising Provider    Answer:   Quentin Angst [id number]  . clonazePAM (KLONOPIN) 0.5 MG tablet    Sig: Take 1 tablet (0.5 mg total)  by mouth 2 (two) times daily as needed for anxiety.    Dispense:  15 tablet    Refill:  0    Order Specific Question:   Supervising Provider    Answer:   Quentin Angst [id number]    Follow-up: Return in about 4 weeks (around 03/12/2017) for anxeity.   Loletta Specter PA

## 2017-02-12 NOTE — Patient Instructions (Signed)
   Trastorno de ansiedad generalizada (Generalized Anxiety Disorder) El trastorno de ansiedad generalizada es un trastorno mental. Interfiere en las funciones vitales, incluyendo las relaciones, el trabajo y la escuela.  Es diferente de la ansiedad normal que todas las personas experimentan en algn momento de su vida en respuesta a sucesos y actividades especficas. En verdad, la ansiedad normal nos ayuda a prepararnos y atravesar estos acontecimientos y actividades de la vida. La ansiedad normal desaparece despus de que el evento o la actividad ha finalizado.  El trastorno de ansiedad generalizada no est necesariamente relacionada con eventos o actividades especficas. Tambin causa un exceso de ansiedad en proporcin a sucesos o actividades especficas. En este trastorno la ansiedad es difcil de controlar. Los sntomas pueden variar de leves a muy graves. Las personas que sufren de trastorno de ansiedad generalizada pueden tener intensas olas de ansiedad con sntomas fsicos (ataques de pnico).  SNTOMAS  La ansiedad y la preocupacin asociada a este trastorno son difciles de controlar. Esta ansiedad y la preocupacin estn relacionados con muchos eventos de la vida y sus actividades y tambin ocurre durante ms das de los que no ocurre, durante 6 meses o ms. Las personas que la sufren pueden tener tres o ms de los siguientes sntomas (uno o ms en los nios):   Agitacin   Fatiga.  Dificultades de concentracin.   Irritabilidad.  Tensin muscular  Dificultad para dormirse o sueo poco satisfactorio. DIAGNSTICO  Se diagnostica a travs de una evaluacin realizada por el mdico. El mdico le har preguntas acerca de su estado de nimo, sntomas fsicos y sucesos de su vida. Le har preguntas sobre su historia clnica, el consumo de alcohol o drogas, incluyendo los medicamentos recetados. Tambin le har un examen fsico e indicar anlisis de sangre. Ciertas enfermedades y el uso de  determinadas sustancias pueden causar sntomas similares a este trastorno. Su mdico lo puede derivar a un especialista en salud mental para una evaluacin ms profunda..  TRATAMIENTO  Las terapias siguientes se utilizan en el tratamiento de este trastorno:   Medicamentos - Se recetan antidepresivos para el control diario a largo plazo. Pueden indicarse tambin medicamentos para combatir la ansiedad en los casos graves, especialmente cuando ocurren ataques de pnico.   Terapia conversada (psicoterapia) Ciertos tipos de psicoterapia pueden ser tiles en el tratamiento del trastorno de ansiedad generalizada, proporcionando apoyo, educacin y orientacin. Una forma de psicoterapia llamada terapia cognitivo-conductual puede ensearle formas saludables de pensar y reaccionar a los eventos y actividades de la vida diaria.  Tcnicasde manejo del estrs- Estas tcnicas incluyen el yoga, la meditacin y el ejercicio y pueden ser muy tiles cuando se practican con regularidad. Un especialista en salud mental puede ayudar a determinar qu tratamiento es mejor para usted. Algunas personas obtienen mejora con una terapia. Sin embargo, otras personas requieren una combinacin de terapias.  Esta informacin no tiene como fin reemplazar el consejo del mdico. Asegrese de hacerle al mdico cualquier pregunta que tenga. Document Released: 10/07/2012 Document Revised: 07/03/2014 Elsevier Interactive Patient Education  2017 [employer]  

## 2017-02-13 LAB — FSH/LH
FSH: 5.3 m[IU]/mL
LH: 8.6 m[IU]/mL

## 2017-02-14 ENCOUNTER — Telehealth (INDEPENDENT_AMBULATORY_CARE_PROVIDER_SITE_OTHER): Payer: Self-pay

## 2017-02-14 NOTE — Telephone Encounter (Signed)
Left message for patient to call the office back. If patient calls please provide with results. Maryjean Morn, CMA

## 2017-02-14 NOTE — Telephone Encounter (Signed)
-----   Message from Loletta Specter, PA-C sent at 02/13/2017  9:09 AM EDT ----- Hormones normal. Keep GYN referral, she will need further testing there.

## 2017-02-16 ENCOUNTER — Encounter (INDEPENDENT_AMBULATORY_CARE_PROVIDER_SITE_OTHER): Payer: Self-pay

## 2017-02-28 DIAGNOSIS — Z5321 Procedure and treatment not carried out due to patient leaving prior to being seen by health care provider: Secondary | ICD-10-CM | POA: Insufficient documentation

## 2017-02-28 DIAGNOSIS — N939 Abnormal uterine and vaginal bleeding, unspecified: Secondary | ICD-10-CM | POA: Insufficient documentation

## 2017-02-28 NOTE — ED Triage Notes (Signed)
[facility] Patient is having severe vaginal bleeding and not taking IRON medication in the last month.

## 2017-03-01 ENCOUNTER — Emergency Department (HOSPITAL_COMMUNITY)
Admission: EM | Admit: 2017-03-01 | Discharge: 2017-03-01 | Disposition: A | Discharge status: Home / Self Care | Payer: Self-pay | Attending: Emergency Medicine | Admitting: Emergency Medicine

## 2017-03-01 ENCOUNTER — Encounter (HOSPITAL_COMMUNITY): Payer: Self-pay | Admitting: Family Medicine

## 2017-03-01 NOTE — ED Notes (Signed)
First attempt to transfer PT to rm PT not in waiting area

## 2017-03-01 NOTE — ED Notes (Signed)
Second attempt---PT did not respond to name. Unable to move PT into exam room. PT not in waiting area

## 2017-03-12 ENCOUNTER — Ambulatory Visit (INDEPENDENT_AMBULATORY_CARE_PROVIDER_SITE_OTHER): Payer: Self-pay | Admitting: Physician Assistant

## 2017-03-15 ENCOUNTER — Encounter (INDEPENDENT_AMBULATORY_CARE_PROVIDER_SITE_OTHER): Payer: Self-pay | Admitting: Physician Assistant

## 2017-03-15 ENCOUNTER — Ambulatory Visit (INDEPENDENT_AMBULATORY_CARE_PROVIDER_SITE_OTHER): Payer: Self-pay | Admitting: Physician Assistant

## 2017-03-15 VITALS — BP 115/78 | HR 73 | Temp 97.8°F | Wt 233.6 lb

## 2017-03-15 DIAGNOSIS — Z9119 Patient's noncompliance with other medical treatment and regimen: Secondary | ICD-10-CM

## 2017-03-15 DIAGNOSIS — Z23 Encounter for immunization: Secondary | ICD-10-CM

## 2017-03-15 DIAGNOSIS — D649 Anemia, unspecified: Secondary | ICD-10-CM

## 2017-03-15 DIAGNOSIS — Z91199 Patient's noncompliance with other medical treatment and regimen due to unspecified reason: Secondary | ICD-10-CM

## 2017-03-15 DIAGNOSIS — N921 Excessive and frequent menstruation with irregular cycle: Secondary | ICD-10-CM

## 2017-03-15 DIAGNOSIS — F411 Generalized anxiety disorder: Secondary | ICD-10-CM

## 2017-03-15 NOTE — Patient Instructions (Signed)
   Trastorno de ansiedad generalizada (Generalized Anxiety Disorder) El trastorno de ansiedad generalizada es un trastorno mental. Interfiere en las funciones vitales, incluyendo las relaciones, el trabajo y la escuela.  Es diferente de la ansiedad normal que todas las personas experimentan en algn momento de su vida en respuesta a sucesos y actividades especficas. En verdad, la ansiedad normal nos ayuda a prepararnos y atravesar estos acontecimientos y actividades de la vida. La ansiedad normal desaparece despus de que el evento o la actividad ha finalizado.  El trastorno de ansiedad generalizada no est necesariamente relacionada con eventos o actividades especficas. Tambin causa un exceso de ansiedad en proporcin a sucesos o actividades especficas. En este trastorno la ansiedad es difcil de controlar. Los sntomas pueden variar de leves a muy graves. Las personas que sufren de trastorno de ansiedad generalizada pueden tener intensas olas de ansiedad con sntomas fsicos (ataques de pnico).  SNTOMAS  La ansiedad y la preocupacin asociada a este trastorno son difciles de controlar. Esta ansiedad y la preocupacin estn relacionados con muchos eventos de la vida y sus actividades y tambin ocurre durante ms das de los que no ocurre, durante 6 meses o ms. Las personas que la sufren pueden tener tres o ms de los siguientes sntomas (uno o ms en los nios):   Agitacin   Fatiga.  Dificultades de concentracin.   Irritabilidad.  Tensin muscular  Dificultad para dormirse o sueo poco satisfactorio. DIAGNSTICO  Se diagnostica a travs de una evaluacin realizada por el mdico. El mdico le har preguntas acerca de su estado de nimo, sntomas fsicos y sucesos de su vida. Le har preguntas sobre su historia clnica, el consumo de alcohol o drogas, incluyendo los medicamentos recetados. Tambin le har un examen fsico e indicar anlisis de sangre. Ciertas enfermedades y el uso de  determinadas sustancias pueden causar sntomas similares a este trastorno. Su mdico lo puede derivar a un especialista en salud mental para una evaluacin ms profunda..  TRATAMIENTO  Las terapias siguientes se utilizan en el tratamiento de este trastorno:   Medicamentos - Se recetan antidepresivos para el control diario a largo plazo. Pueden indicarse tambin medicamentos para combatir la ansiedad en los casos graves, especialmente cuando ocurren ataques de pnico.   Terapia conversada (psicoterapia) Ciertos tipos de psicoterapia pueden ser tiles en el tratamiento del trastorno de ansiedad generalizada, proporcionando apoyo, educacin y orientacin. Una forma de psicoterapia llamada terapia cognitivo-conductual puede ensearle formas saludables de pensar y reaccionar a los eventos y actividades de la vida diaria.  Tcnicasde manejo del estrs- Estas tcnicas incluyen el yoga, la meditacin y el ejercicio y pueden ser muy tiles cuando se practican con regularidad. Un especialista en salud mental puede ayudar a determinar qu tratamiento es mejor para usted. Algunas personas obtienen mejora con una terapia. Sin embargo, otras personas requieren una combinacin de terapias.  Esta informacin no tiene como fin reemplazar el consejo del mdico. Asegrese de hacerle al mdico cualquier pregunta que tenga. Document Released: 10/07/2012 Document Revised: 07/03/2014 Elsevier Interactive Patient Education  2017 [employer]  

## 2017-03-15 NOTE — Progress Notes (Signed)
   Subjective:  Patient ID: Nancy [name], female    DOB: September 22, 1980  Age: 36 y.o. MRN: [id number]  CC: menometrorrhagia  HPI   Nancy B Martinezis a 36 y.o.femalewith a PMH of panic attacks, nephrolithiasis, anemia, and cholelithiasis presents to discuss menomettrorrhagia. Findings on US revealed 18.2 mm endometrial stripe. Has been referred to GYN and has an appointment in 4 days.     She had also been diagnosed with generalized anxiety disorder at the last visit. Prescribed Escitalopram, clonazepam, and referred to psychiatry. Did not take any Escitalopram or Clonazepam because she has children and does not want to be "doped up" on medications. Also feels that she would rather depend on spiritual help than pharmacotherapy. She has also adjusted diet to include serotonin producing foods such as almonds, blueberries, tumeric, etc.   Outpatient Medications Prior to Visit  Medication Sig Dispense Refill  . clonazePAM (KLONOPIN) 0.5 MG tablet Take 1 tablet (0.5 mg total) by mouth 2 (two) times daily as needed for anxiety. 15 tablet 0  . escitalopram (LEXAPRO) 10 MG tablet Take 1 tablet (10 mg total) by mouth daily. 30 tablet 2  . fluticasone (FLONASE) 50 MCG/ACT nasal spray Place 2 sprays into both nostrils daily. 16 g 6  . ibuprofen (ADVIL,MOTRIN) 100 MG tablet Take 100 mg by mouth every 6 (six) hours as needed for fever.    . loratadine (CLARITIN) 10 MG tablet Take 1 tablet (10 mg total) by mouth daily. 30 tablet 11   No facility-administered medications prior to visit.      ROS Review of Systems  Constitutional: Negative for chills, fever and malaise/fatigue.  Eyes: Negative for blurred vision.  Respiratory: Negative for shortness of breath.   Cardiovascular: Negative for chest pain and palpitations.  Gastrointestinal: Negative for abdominal pain and nausea.  Genitourinary: Negative for dysuria and hematuria.       Heavy and irregular menstrual bleeding.  Musculoskeletal:  Negative for joint pain and myalgias.  Skin: Negative for rash.  Neurological: Negative for tingling and headaches.  Psychiatric/Behavioral: Negative for depression. The patient is not nervous/anxious.     Objective:  There were no vitals taken for this visit.  BP/Weight 03/01/2017 02/28/2017 02/12/2017  Systolic BP - 125 109  Diastolic BP - 80 73  Wt. (Lbs) 233 - 223  BMI 44.02 - 40.79      Physical Exam  Constitutional: She is oriented to person, place, and time.  Well developed, obese, NAD, polite  HENT:  Head: Normocephalic and atraumatic.  Eyes: No scleral icterus.  Pulmonary/Chest: Effort normal.  Neurological: She is alert and oriented to person, place, and time. No cranial nerve deficit. Coordination normal.  Skin: Skin is warm and dry. No rash noted. No erythema. No pallor.  Psychiatric: Her behavior is normal. Thought content normal.  Somewhat anxious and fidgety   Vitals reviewed.    Assessment & Plan:   1. Menometrorrhagia - Has GYN appointment in four days.  2. Anemia, unspecified - Likely attributed to menometrorrhagia - CBC - Vitamin B12  2. Generalized anxiety disorder - Advised to consider taking Escitalopram and Clonazepam as directed - Still awaiting psychiatry referral  3. Need for Tdap vaccination - Pt declined at this time.     Follow-up: 6 months anxiety  Loletta Specter PA

## 2017-03-16 LAB — VITAMIN B12: Vitamin B-12: 672 pg/mL (ref 232–1245)

## 2017-03-16 LAB — CBC WITH DIFFERENTIAL/PLATELET
Basophils Absolute: 0 10*3/uL (ref 0.0–0.2)
Basos: 0 %
EOS (ABSOLUTE): 0.5 10*3/uL — ABNORMAL HIGH (ref 0.0–0.4)
Eos: 4 %
Hematocrit: 36 % (ref 34.0–46.6)
Hemoglobin: 11.5 g/dL (ref 11.1–15.9)
Immature Grans (Abs): 0 10*3/uL (ref 0.0–0.1)
Immature Granulocytes: 0 %
Lymphocytes Absolute: 3.4 10*3/uL — ABNORMAL HIGH (ref 0.7–3.1)
Lymphs: 30 %
MCH: 27.8 pg (ref 26.6–33.0)
MCHC: 31.9 g/dL (ref 31.5–35.7)
MCV: 87 fL (ref 79–97)
Monocytes Absolute: 0.6 10*3/uL (ref 0.1–0.9)
Monocytes: 5 %
Neutrophils Absolute: 7 10*3/uL (ref 1.4–7.0)
Neutrophils: 61 %
Platelets: 334 10*3/uL (ref 150–379)
RBC: 4.13 x10E6/uL (ref 3.77–5.28)
RDW: 15.6 % — ABNORMAL HIGH (ref 12.3–15.4)
WBC: 11.5 10*3/uL — ABNORMAL HIGH (ref 3.4–10.8)

## 2017-03-19 ENCOUNTER — Ambulatory Visit (INDEPENDENT_AMBULATORY_CARE_PROVIDER_SITE_OTHER): Payer: Self-pay | Admitting: Obstetrics & Gynecology

## 2017-03-19 ENCOUNTER — Encounter: Payer: Self-pay | Admitting: Obstetrics & Gynecology

## 2017-03-19 VITALS — BP 131/77 | HR 87 | Wt 234.8 lb

## 2017-03-19 DIAGNOSIS — N920 Excessive and frequent menstruation with regular cycle: Secondary | ICD-10-CM

## 2017-03-19 MED ORDER — ETONOGESTREL-ETHINYL ESTRADIOL 0.12-0.015 MG/24HR VA RING: VAGINAL_RING | VAGINAL | 6 refills | Status: DC

## 2017-03-19 MED FILL — **NUVARING VAGINAL RING: 0.12-0.015 | 28 days supply | Qty: 1 | Fill #0

## 2017-03-19 NOTE — Patient Instructions (Signed)
Ethinyl Estradiol; Etonogestrel vaginal ring Qu es este medicamento? El ETINIL ESTRADIOL; ETONOGESTREL es un anillo vaginal flexible utilizado como un anticonceptivo (control de natalidad). Este producto [employer] tipos de hormonas femeninas, un estrgeno y Neomia Dear progestina. Este anillo se [place] para Transport planner ovulacin y Firefighter. Cada anillo es efectivo durante un mes. Este medicamento puede ser utilizado para otros usos; si tiene alguna pregunta consulte con su proveedor de atencin mdica o con su farmacutico. MARCAS COMUNES: NuvaRing Qu le debo informar a mi profesional de la salud antes de tomar este medicamento? Necesita saber si usted presenta alguno de los siguientes problemas o situaciones: -sangrado vaginal anormal -enfermedad vascular o cogulos sanguneos -cncer de mama, cervical, endometrio, ovario, hgado o uterino -diabetes -enfermedad de la vescula biliar -enfermedad cardiaca o ataque cardiaco reciente -alta presin sangunea -niveles elevados de colesterol -enfermedad renal -enfermedad heptica -migraa -derrame cerebral -lupus eritematoso sistmico (LES) -fumador -una reaccin alrgica o inusual a los estrgenos, progestinas, otros medicamentos, alimentos, colorantes o conservantes -si est embarazada o buscando quedar embarazada -si est amamantando a un beb Cmo debo utilizar este medicamento? Inserte el anillo en la vagina segn las instrucciones. Siga las instrucciones de la etiqueta del [place]. El Freight forwarder en su lugar durante 3 semanas y luego se retira para un descanso de 1 semana. Se inserta un anillo nuevo 1 semana despus de que se retir el ltimo anillo, el mismo da de la [place]. Revise frecuentemente para asegurarse de que el anillo siga en su lugar, en especial antes y despus de Management consultant. Si el anillo estuvo fuera de la vagina por un perodo desconocido de [place], es posible que usted no est protegida Engineer, petroleum. Realcese una prueba de [place] y llame a su mdico. No utilice su medicamento con una frecuencia mayor a la indicada. Recibir un folleto de informacin para el paciente con cada receta y en cada ocasin que la vuelva a surtir. Asegrese de leer este folleto cada vez cuidadosamente. Este folleto puede cambiar con frecuencia. Hable con su pediatra acerca del uso de este medicamento en nios. Puede requerir atencin especial. Este medicamento ha sido usado en nias que han empezado a tener perodos [place]. Sobredosis: Pngase en contacto inmediatamente con un centro toxicolgico o una sala de urgencia si usted cree que haya tomado demasiado medicamento. ATENCIN: [employer] es solo para usted. No comparta este medicamento con nadie. Qu sucede si me olvido de una dosis? Generalmente, slo necesitar reemplazar su anillo vaginal una vez por mes. Pregunte a su profesional de la salud qu hacer si deja su anillo vaginal colocado durante un perodo de tiempo mayor o menor del que se supone o si se Quarry manager. Qu puede interactuar con este medicamento? No tome esta medicina con los siguientes medicamentos: dasabuvir; ombitasvir: paritaprevir; ritonavir ombitasvir; paritaprevir; ritonavir Este medicamento tambin puede interactuar con los siguientes medicamentos: acetaminofeno (paracetamol) antibiticos o medicamentos para infecciones, especialmente rifampicina, rifabutina, rifapentina y [place], y posiblemente penicilina o tetraciclina aprepitant cido ascrbico (vitamina C) atorvastatina barbitricos tales como el fenobarbital bosentano carbamazepina cafena clofibrato ciclosporina dantroleno doxercalciferol felbamato jugo de toronja hidrocortisona medicamentos para la ansiedad o para los problemas para conciliar el sueo, como diazepam o temazepam medicamentos para la diabetes, como pioglitazona modafinilo micofenolato nefazodona oxcarbazepina fenitona prednisolona ritonavir u  otros medicamentos para tratar el VIH o el SIDA rosuvastatina selegilina suplementos de isoflavonas de soya hierba de [place] tamoxifeno o raloxifeno teofilina hormonas tiroideas topiramato warfarina Puede ser que [employer]  no menciona todas las posibles interacciones. Informe a su profesional de [employer] de [employer] productos a base de hierbas, medicamentos de [place] o suplementos nutritivos que est tomando. Si usted fuma, consume bebidas alcohlicas o si utiliza drogas ilegales, indqueselo tambin a su profesional de [employer]. Algunas sustancias pueden interactuar con su medicamento. A qu debo estar atento al usar [employer]? Visite a su mdico o a su profesional de la salud para revisar regularmente su evolucin. Debe hacerse exmenes de Papanicolaou y exmenes de las mamas y la pelvis en forma regular mientras est tomando este medicamento. Use un mtodo anticonceptivo Public relations account executive ciclo que est usando este anillo. No use un diafragma ni condn femenino, ya que el anillo puede interferir con estos mtodos anticonceptivos y Tour manager. Si tiene algn motivo para pensar que est embarazada, deje de usar este medicamento de inmediato y comunquese con su mdico o con su profesional de Radiographer, therapeutic. Si [place] este medicamento para tratar problemas relacionados con las hormonas, pueden pasar varios ciclos hasta que observe mejoras en su condicin. El fumar aumenta el riesgo de formacin de cogulos o de sufrir un derrame cerebral mientras [place] anticonceptivos hormonales, especialmente si tiene ms de 35 aos. Se le aconseja firmemente dejar de fumar. Este medicamento puede hacer que su cuerpo retenga lquido, lo que puede provocar que se le hinchen los dedos, las manos o los tobillos. Su presin sangunea Manufacturing engineer. Contacte a su mdico o profesional de la salud si siente que est reteniendo lquido. Este medicamento puede aumentar su sensibilidad al sol. Evite la  [employer]. Si no la Network engineer, utilice ropa protectora y crema de Orthoptist. No utilice lmparas solares, camas solares ni cabinas solares. Si [place] lentes de contacto y observa cambios en la visin, o si los lentes comienzan a resultarle incmodos, consulte al especialista en ojos. Algunas mujeres pueden presentar sensibilidad, hinchazn o sangrado leve de las encas. Informe a su dentista si esto sucede. Cepillarse los dientes y usar hilo dental regularmente pueden ayudar a limitar este problema. Visite peridicamente a su dentista e infrmele acerca de los medicamentos que est tomando. Si va a someterse a Associate Professor, tal vez deba dejar de usar este medicamento de antemano. Consulte a su profesional de [employer]. Este medicamento no la protege de la infeccin por VIH (SIDA) ni de ninguna otra enfermedad de transmisin sexual. Qu efectos secundarios puedo tener al utilizar este medicamento? Efectos secundarios que debe informar a su mdico o a Producer, television/film/video de la salud tan pronto como sea posible: -secreciones o cambios en el tejido de las mamas -cambios en el sangrado vaginal durante el perodo menstrual o entre perodos menstruales -dolor en el pecho -tos con sangre -mareos o desmayos -dolores de cabeza o migraas -dolores de Training and development officer severos o repentinos -Theme park manager (severo) -falta de aliento repentina -falta de coordinacin repentina, especialmente en un lado del cuerpo -problemas del habla -sntomas de infeccin vaginal como picazn, irritacin o flujo inusual -sensibilidad en la parte superior del abdomen -vmito -debilidad o entumecimiento de los brazos o piernas, especialmente de un lado del cuerpo -color amarillento de los ojos o la piel Efectos secundarios que, por lo general, no requieren atencin mdica (debe informarlos a su mdico o a su profesional de la salud si persisten o si son molestos): -sangrado o manchas inesperadas que continan despus de  los 3 primeros ciclos de pldoras -sensibilidad en las mamas -cambios de estados de nimo, ansiedad,  depresin, frustracin, ira o exaltacin -aumento de la sensibilidad al sol o a la luz ultravioleta -nuseas -erupcin cutnea, acn o manchas marrones en la piel -aumento de peso (leve) Puede ser que esta lista no menciona todos los posibles efectos secundarios. Comunquese a su mdico por asesoramiento mdico [employer]. Usted puede informar los efectos secundarios a la FDA por telfono al 1-800-FDA-1088. Dnde debo guardar mi medicina? Mantngala fuera del alcance de los nios. Gurdela a [employer], entre 15 y 30 grados C (59 y 53 grados F) durante hasta 4 meses. Este producto se expirara despus de 4 meses. Protjala de la luz. Deseche todos los medicamentos que no haya utilizado, despus de la fecha de vencimiento. ATENCIN: Este folleto es un resumen. Puede ser que no cubra toda la posible informacin. Si usted tiene preguntas acerca de esta medicina, consulte con su mdico, su farmacutico o su profesional de Radiographer, therapeutic.  2018 Elsevier/Gold Standard (2016-07-13 00:00:00)

## 2017-03-19 NOTE — Progress Notes (Signed)
Patient ID: Nancy [name], female   DOB: [birth year], 36 y.o.   MRN: [id number]  Chief Complaint  Patient presents with  . Gynecologic Exam  menometrorrhagia  HPI Nancy [name] is a 36 y.o. female.  [id number] Patient's last menstrual period was 03/07/2017 (approximate). Prolonged menses up to 4 weeks of flow, no bleeding now. She did well with NuvaRing until she developed breast tenderness about 2 years ago so she stopped the medication. She is willing to start Nuva Ring again. She was concerned that her PA told her to have a EMBX due to risk of endometrial cancer. HPI  Past Medical History:  Diagnosis Date  . Anemia   . Gallstones     Past Surgical History:  Procedure Laterality Date  . CESAREAN SECTION    . CHOLECYSTECTOMY N/A 10/13/2012   Procedure: LAPAROSCOPIC CHOLECYSTECTOMY WITH INTRAOPERATIVE CHOLANGIOGRAM;  Surgeon: Shelly Rubenstein, MD;  Location: [facility];  Service: General;  Laterality: N/A;    No family history on file.  Social History Social History  Substance Use Topics  . Smoking status: Never Smoker  . Smokeless tobacco: Never Used  . Alcohol use No    PCN allergy   Current Outpatient Prescriptions  Medication Sig Dispense Refill  . ibuprofen (ADVIL,MOTRIN) 100 MG tablet Take 100 mg by mouth every 6 (six) hours as needed for fever.    . etonogestrel-ethinyl estradiol (NUVARING) 0.12-0.015 MG/24HR vaginal ring INSERT VAGINALLY AND LEAVE IN PLACE FOR 3 CONSECUTIVE WEEKS, THEN REMOVE FOR 1 WEEK. 3 each 6  . fluticasone (FLONASE) 50 MCG/ACT nasal spray Place 2 sprays into both nostrils daily. (Patient not taking: Reported on 03/19/2017) 16 g 6  . loratadine (CLARITIN) 10 MG tablet Take 1 tablet (10 mg total) by mouth daily. (Patient not taking: Reported on 03/19/2017) 30 tablet 11   No current facility-administered medications for this visit.     Review of Systems Review of Systems  Constitutional: Negative.   Respiratory: Negative.   Gastrointestinal:  Negative.   Genitourinary: Positive for menstrual problem. Negative for vaginal bleeding and vaginal discharge.    Blood pressure 131/77, pulse 87, weight 106.5 kg (234 lb 12.8 oz), last menstrual period 03/07/2017.  Physical Exam Physical Exam  Constitutional: She is oriented to person, place, and time. She appears well-developed. No distress.  Cardiovascular: Normal rate.   Pulmonary/Chest: Effort normal.  Neurological: She is alert and oriented to person, place, and time.  Psychiatric: She has a normal mood and affect. Her behavior is normal.  Vitals reviewed.   Data Reviewed CLINICAL DATA:  Menometrorrhagia  EXAM: TRANSABDOMINAL AND TRANSVAGINAL ULTRASOUND OF PELVIS  TECHNIQUE: Both transabdominal and transvaginal ultrasound examinations of the pelvis were performed. Transabdominal technique was performed for global imaging of the pelvis including uterus, ovaries, adnexal regions, and pelvic cul-de-sac. It was necessary to proceed with endovaginal exam following the transabdominal exam to visualize the uterus and endometrium.  COMPARISON:  None  FINDINGS: Uterus  Measurements: 8.9 x 5.2 x 6.7 cm. Appears retroverted.  Endometrium  Thickness: 18.2 mm. Mobile echogenic debris is identified within the lumen of the endometrial cavity. No mass.  Right ovary  Measurements: 3.6 x 2.4 x 2.5 cm. Normal appearance/no adnexal mass.  Left ovary  Measurements: 3.2 x 2.1 x 2.4 cm. Normal appearance/no adnexal mass.  Other findings  No abnormal free fluid.  IMPRESSION: 1. The endometrium appears thickened measuring 18.2 mm. There is low bowel echogenic debris within the lumen of the endometrial cavity which may represent blood  products. No focal abnormality identified. If bleeding remains unresponsive to hormonal or medical therapy, focal lesion work-up with sonohysterogram should be considered. Endometrial biopsy should also be considered in  pre-menopausal patients at high risk for endometrial carcinoma. (Ref: Radiological Reasoning: Algorithmic Workup of Abnormal Vaginal Bleeding with Endovaginal Sonography and Sonohysterography. AJR 2008; 454:U98-11)   Electronically Signed   By: Signa Kell M.D.   On: 02/07/2017 16:03  Assessment    Menometrorrhagia after stopping cycle control with Nuva Ring [place] report and images reviewed Reproductive age female at low risk for endometrial neoplasia    Plan    No Bx planned at this time Nuva Ring RTC 4 months        Scheryl Darter 03/19/2017, 3:16 PM

## 2017-03-20 ENCOUNTER — Telehealth (INDEPENDENT_AMBULATORY_CARE_PROVIDER_SITE_OTHER): Payer: Self-pay

## 2017-03-20 ENCOUNTER — Encounter: Payer: Self-pay | Admitting: *Deleted

## 2017-03-20 NOTE — Telephone Encounter (Signed)
-----   Message from Loletta Specter, PA-C sent at 03/19/2017  6:01 PM EDT ----- Not anemic. WBCs are consistently elevated and will need further work up.

## 2017-03-20 NOTE — Telephone Encounter (Signed)
Using pacific interpreters [place], [id number] . Left message for patient to call office, if patient returns call please provide with the following results. Not anemic. WBCs are consistently elevated and will need further work up. Maryjean Morn, CMA

## 2017-05-07 MED FILL — **NUVARING VAGINAL RING: 0.12-0.015 | 28 days supply | Qty: 1 | Fill #1

## 2017-06-05 MED FILL — **NUVARING VAGINAL RING: 0.12-0.015 | 28 days supply | Qty: 1 | Fill #2

## 2017-06-29 ENCOUNTER — Telehealth (INDEPENDENT_AMBULATORY_CARE_PROVIDER_SITE_OTHER): Payer: Self-pay | Admitting: Physician Assistant

## 2017-06-29 NOTE — Telephone Encounter (Signed)
Patient wants to know if she need to schedule a lab work or visit with provider regarding her blood work results

## 2017-07-02 NOTE — Telephone Encounter (Signed)
Patients results are from September, does patient still need additional work up? If so, should she schedule a lab only visit or a office visit to be seen by the provider?

## 2017-07-02 NOTE — Telephone Encounter (Signed)
Visit with me please. I am looking to see if her white blood cells remain elevated.

## 2017-07-03 NOTE — Telephone Encounter (Signed)
Please call patient and schedule appointment. Thanks. Maryjean Mornempestt S Normal Recinos, CMA

## 2017-07-05 MED FILL — **NUVARING VAGINAL RING: 0.12-0.015 | 28 days supply | Qty: 1 | Fill #3

## 2017-07-13 ENCOUNTER — Other Ambulatory Visit: Payer: Self-pay | Admitting: Physician Assistant

## 2017-07-13 NOTE — Progress Notes (Signed)
Opened in error

## 2017-07-16 ENCOUNTER — Ambulatory Visit (INDEPENDENT_AMBULATORY_CARE_PROVIDER_SITE_OTHER): Payer: Self-pay | Admitting: Physician Assistant

## 2017-07-18 ENCOUNTER — Ambulatory Visit (INDEPENDENT_AMBULATORY_CARE_PROVIDER_SITE_OTHER): Payer: Self-pay | Admitting: Physician Assistant

## 2017-07-19 ENCOUNTER — Ambulatory Visit (INDEPENDENT_AMBULATORY_CARE_PROVIDER_SITE_OTHER): Payer: Self-pay | Admitting: Physician Assistant

## 2017-07-19 ENCOUNTER — Other Ambulatory Visit: Payer: Self-pay

## 2017-07-19 ENCOUNTER — Encounter (INDEPENDENT_AMBULATORY_CARE_PROVIDER_SITE_OTHER): Payer: Self-pay | Admitting: Physician Assistant

## 2017-07-19 VITALS — BP 119/85 | HR 91 | Temp 98.3°F | Wt 239.2 lb

## 2017-07-19 DIAGNOSIS — D72820 Lymphocytosis (symptomatic): Secondary | ICD-10-CM

## 2017-07-19 DIAGNOSIS — J320 Chronic maxillary sinusitis: Secondary | ICD-10-CM

## 2017-07-19 MED ORDER — FLUTICASONE PROPIONATE 50 MCG/ACT NA SUSP: 2.0000 | Freq: Every day | NASAL | 6 refills | Status: DC

## 2017-07-19 MED ORDER — CETIRIZINE HCL 10 MG PO TABS: 10.0000 mg | ORAL_TABLET | Freq: Every day | ORAL | 3 refills | Status: DC

## 2017-07-19 NOTE — Patient Instructions (Signed)
Sinusitis, en adultos Sinusitis, Adult La sinusitis es la inflamacin y el dolor en los senos paranasales. Los senos paranasales son espacios vacos en los huesos alrededor del rostro. Los senos paranasales se encuentran en estos lugares:  Alrededor de los ojos.  En la mitad de la frente.  Detrs de la nariz.  En los pmulos.  Los senos y las fosas nasales estn cubiertos de un lquido fibroso (mucosidad). Normalmente, la mucosidad drena a travs de los senos. Cuando los tejidos nasales se inflaman o hinchan, la mucosidad puede quedar atrapada o bloqueada, por lo que el aire no puede fluir por los senos paranasales. Esto fomenta la proliferacin de bacterias, virus y hongos, lo que produce infecciones. La sinusitis puede desarrollarse rpidamente y durar entre 7 y 10das (aguda) o ms de 12das (crnica). A menudo, esta afeccin surge despus de un resfriado. Cules son las causas? Esta afeccin es causada por cualquier sustancia que inflame los senos o evite que la mucosidad drene, por ejemplo:  Alergias.  Asma.  Infecciones virales o bacterianas.  Huesos con forma anmala entre las fosas nasales.  Crecimientos nasales que contienen mucosidad (plipos nasales).  Aberturas sinusales estrechas.  Agentes contaminantes, como sustancias qumicas o irritantes presentes en el aire.  Un cuerpo extrao atorado en la nariz.  Infecciones por hongos. Esto es raro.  Qu incrementa el riesgo? Los siguientes factores pueden hacer que usted sea ms propenso a tener esta enfermedad:  Padecer alergias o asma.  Haber tenido una infeccin reciente en las vas respiratorias superiores o un resfriado.  Tener deformidades estructurales u obstrucciones en la nariz o los senos.  Tener un sistema inmunitario dbil.  Nadar o bucear mucho.  Abusar de los aerosoles nasales.  Fumar.  Cules son los signos o los sntomas? Los principales sntomas de esta afeccin son dolor y sensacin de  presin alrededor de los senos afectados. Otros sntomas pueden ser los siguientes:  Dolor en los dientes superiores.  Dolor de odos.  Dolor de cabeza.  Mal aliento.  Disminucin del sentido del olfato y del gusto.  Tos que empeora por la noche.  Fatiga.  Fiebre.  Drenaje de mucosidad espesa que sale de la nariz. Generalmente, es de color verde y puede contener pus (purulento).  Nariz tapada o congestin nasal.  Goteo posnasal. Esto ocurre cuando se acumula mucosidad adicional en la garganta o la parte de atrs de la nariz.  Hinchazn y calor en los senos paranasales afectados.  Dolor de garganta.  Sensibilidad a la luz.  Cmo se diagnostica? Esta enfermedad se diagnostica en funcin de los sntomas, los antecedentes mdicos y un examen fsico. Para descubrir si su afeccin es aguda o crnica, el mdico podra hacer lo siguiente:  Revisar su nariz en busca de plipos nasales.  Palpar los senos paranasales afectados para buscar signos de infeccin.  Observar la parte interna de los senos paranasales con un dispositivo que tiene una luz (endoscopio).  Si el mdico sospecha que usted padece sinusitis crnica, podra indicarle lo siguiente:  Pruebas de alergias.  Una muestra de mucosidad de la nariz (cultivo nasal) para buscar bacterias.  Examen de una muestra de mucosidad, para ver si la sinusitis se relaciona con alguna alergia.  Si la sinusitis no responde al tratamiento y dura ms de 8semanas, se le podra pedir una resonancia magntica (RM) o una exploracin por tomografa computarizada (TC) para examinar los senos paranasales. Estos estudios tambin ayudan a determinar la gravedad de la infeccin. En contadas ocasiones, se puede ordenar   una biopsia de hueso para descartar tipos ms graves de infecciones por hongos en los senos paranasales. Cmo se trata? El tratamiento para la sinusitis depende de la causa y de si la afeccin es crnica o aguda. Si lo que  causa la sinusitis es un virus, los sntomas desparecern por s solos en el trmino de 10das. Podran recetarle medicamentos para aliviar los sntomas, entre los que se incluyen los siguientes:  Descongestivos nasales tpicos. Desinflaman las fosas nasales y permiten que la mucosidad drene por los senos paranasales.  Antihistamnicos. Este tipo de medicamento bloquea la inflamacin que ocasionan las alergias. Pueden ayudar a reducir la inflamacin en la nariz y los senos.  Corticoesteroides nasales tpicos. Son aerosoles nasales que reducen la inflamacin e hinchazn en la nariz y los senos.  Lavados nasales con solucin salina. Estos enjuagues pueden ayudar a eliminar la mucosidad espesa en la nariz.  Si la afeccin es causada por una bacteria, se le recetarn antibiticos. Si es causada por un hongo, se le recetarn antimicticos. Se podra necesitar ciruga para tratar enfermedades preexistentes, como las fosas nasales estrechas. Tambin podra ser necesaria para eliminar plipos. Siga estas indicaciones en su casa: Medicamentos  Tome, use o aplquese los medicamentos de venta libre y recetados solamente como se lo haya indicado el mdico. Estos pueden incluir aerosoles nasales.  Si le recetaron un antibitico, tmelo como se lo haya indicado el mdico. No deje de tomar el antibitico aunque comience a sentirse mejor. Hidrtese y humidifique los ambientes  Beba suficiente agua para mantener la orina clara o de color amarillo plido. Mantenerse hidratado lo ayudar a diluir la mucosidad.  Use un humidificador de vapor fro para mantener la humedad de su hogar por encima del 50%.  Realice inhalaciones de vapor por 10 a 15minutos, de 3 a 4veces al da o tal como se lo haya indicado el mdico. Puede hacer esto en el bao con el vapor del agua caliente de la ducha.  Limite la exposicin al aire fro o seco. Reposo  Descanse todo lo que pueda.  Duerma con la cabeza levantada  (elevada).  Asegrese de dormir lo suficiente cada noche. Instrucciones generales  Aplquese un pao tibio y hmedo en la cara 3 o 4veces al da o como se lo haya indicado el mdico. Esto ayuda a calmar las molestias.  Lvese las manos frecuentemente con agua y jabn para reducir la exposicin a virus y otras bacterias. Use desinfectante para manos si no dispone de agua y jabn.  No fume. Evite estar cerca de personas que fuman (fumador pasivo).  Concurra a todas las visitas de seguimiento como se lo haya indicado el mdico. Esto es importante. Comunquese con un mdico si:  Tiene fiebre.  Los sntomas empeoran.  Los sntomas no mejoran en el trmino de 10das. Solicite ayuda de inmediato si:  Tiene un dolor de cabeza intenso.  Tiene vmitos persistentes.  Tiene dolor o hinchazn en la zona del rostro o los ojos.  Tiene problemas de visin.  Se siente confundido.  Tiene el cuello rgido.  Tiene dificultad para respirar. Esta informacin no tiene como fin reemplazar el consejo del mdico. Asegrese de hacerle al mdico cualquier pregunta que tenga. Document Released: 03/22/2005 Document Revised: 10/24/2016 Document Reviewed: 04/07/2015 Elsevier Interactive Patient Education  2018 Elsevier Inc.  

## 2017-07-19 NOTE — Progress Notes (Signed)
Subjective:  Patient ID: Nancy [name], female    DOB: 07/15/1980  Age: 37 y.o. MRN: [id number]  CC: f/u elevated WBC  HPI Nancy [name] is a 37 y.o. female with a medical history of anemia and gallstones presents to f/u on persistently elevated WBCs. Previous labs revealed normal TSH, CMP, and Allergen Zone 2. Elevation is mild from 11.5 K/uL to 12.6 K/uL over the last two years. Pt attributes the elevation to chronic sinusitis. 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 Reported seeing "inflammation" in the sinus cavities. Pt states her symptoms are relieved with zyrtec and fluticasone. Pt currently with mild discomfort in the maxillary sinuses, frontal sinuses, and left ear. Is not taking anything for relief. Does not endorse malaise, illness, fever, chills, or ill close contacts.      Outpatient Medications Prior to Visit  Medication Sig Dispense Refill  . etonogestrel-ethinyl estradiol (NUVARING) 0.12-0.015 MG/24HR vaginal ring INSERT VAGINALLY AND LEAVE IN PLACE FOR 3 CONSECUTIVE WEEKS, THEN REMOVE FOR 1 WEEK. 3 each 6  . fluticasone (FLONASE) 50 MCG/ACT nasal spray Place 2 sprays into both nostrils daily. (Patient not taking: Reported on 03/19/2017) 16 g 6  . ibuprofen (ADVIL,MOTRIN) 100 MG tablet Take 100 mg by mouth every 6 (six) hours as needed for fever.    . loratadine (CLARITIN) 10 MG tablet Take 1 tablet (10 mg total) by mouth daily. (Patient not taking: Reported on 03/19/2017) 30 tablet 11   No facility-administered medications prior to visit.      ROS Review of Systems  Constitutional: Negative for chills, fever and malaise/fatigue.  HENT:       Sinus pressure  Eyes: Negative for blurred vision.  Respiratory: Negative for shortness of breath.   Cardiovascular: Negative for chest pain and palpitations.  Gastrointestinal: Negative for abdominal pain and nausea.  Genitourinary: Negative for dysuria and hematuria.   Musculoskeletal: Negative for joint pain and myalgias.  Skin: Negative for rash.  Neurological: Negative for tingling and headaches.  Psychiatric/Behavioral: Negative for depression. The patient is not nervous/anxious.     Objective:   Vitals:   07/19/17 1102  BP: 119/85  Pulse: 91  Temp: 98.3 F (36.8 C)  SpO2: 96%      Physical Exam  Constitutional: She is oriented to person, place, and time.  Well developed, obese, NAD, polite  HENT:  Head: Normocephalic and atraumatic.  Eyes: No scleral icterus.  Neck: Normal range of motion. Neck supple. No thyromegaly present.  Cardiovascular: Normal rate, regular rhythm and normal heart sounds.  Pulmonary/Chest: Effort normal and breath sounds normal.  Abdominal: Soft. Bowel sounds are normal. There is no tenderness.  Musculoskeletal: She exhibits no edema.  Neurological: She is alert and oriented to person, place, and time.  Skin: Skin is warm and dry. No rash noted. No erythema. No pallor.  Psychiatric: She has a normal mood and affect. Her behavior is normal. Thought content normal.  Vitals reviewed.    Assessment & Plan:   1. Lymphocytosis - Suspect due to chronic sinusitis but will need further work up.  - Pathologist smear review - CBC with Differential  2. Chronic maxillary sinusitis - CBC with Differential - cetirizine (ZYRTEC) 10 MG tablet; Take 1 tablet (10 mg total) by mouth daily.  Dispense: 30 tablet; Refill: 3 - fluticasone (FLONASE) 50 MCG/ACT nasal spray; Place 2 sprays into both nostrils daily.  Dispense: 16 g; Refill: 6 - Pt opts to wait for [facility]  financial assistance before CT sinus is ordered.   Meds ordered this encounter  Medications  . cetirizine (ZYRTEC) 10 MG tablet    Sig: Take 1 tablet (10 mg total) by mouth daily.    Dispense:  30 tablet    Refill:  3    Order Specific Question:   Supervising Provider    Answer:   Quentin Angst [id number]  . fluticasone (FLONASE) 50 MCG/ACT nasal  spray    Sig: Place 2 sprays into both nostrils daily.    Dispense:  16 g    Refill:  6    Order Specific Question:   Supervising Provider    Answer:   Quentin Angst [[id number]]    Follow-up: Return in about 6 weeks (around 08/30/2017) for sinusitis.   Loletta Specter PA

## 2017-07-25 LAB — PATHOLOGIST SMEAR REVIEW
Basophils Absolute: 0.1 10*3/uL (ref 0.0–0.2)
Basos: 1 %
EOS (ABSOLUTE): 0.5 10*3/uL — ABNORMAL HIGH (ref 0.0–0.4)
Eos: 5 %
Hematocrit: 36.4 % (ref 34.0–46.6)
Hemoglobin: 11.3 g/dL (ref 11.1–15.9)
Immature Grans (Abs): 0 10*3/uL (ref 0.0–0.1)
Immature Granulocytes: 0 %
Lymphocytes Absolute: 3.7 10*3/uL — ABNORMAL HIGH (ref 0.7–3.1)
Lymphs: 31 %
MCH: 25.7 pg — ABNORMAL LOW (ref 26.6–33.0)
MCHC: 31 g/dL — ABNORMAL LOW (ref 31.5–35.7)
MCV: 83 fL (ref 79–97)
Monocytes Absolute: 0.8 10*3/uL (ref 0.1–0.9)
Monocytes: 7 %
Neutrophils Absolute: 6.9 10*3/uL (ref 1.4–7.0)
Neutrophils: 56 %
Path Rev PLTs: NORMAL
Path Rev RBC: NORMAL
Platelets: 439 10*3/uL — ABNORMAL HIGH (ref 150–379)
RBC: 4.39 x10E6/uL (ref 3.77–5.28)
RDW: 15.5 % — ABNORMAL HIGH (ref 12.3–15.4)
WBC: 12 10*3/uL — ABNORMAL HIGH (ref 3.4–10.8)

## 2017-07-26 ENCOUNTER — Telehealth (INDEPENDENT_AMBULATORY_CARE_PROVIDER_SITE_OTHER): Payer: Self-pay | Admitting: *Deleted

## 2017-07-26 NOTE — Telephone Encounter (Signed)
Medical Assistant used [employer] to contact patient.  Interpreter Name: Sharyne Richtersdrian Interpreter #: [phone] Patient was not available, [employer] left patient a voicemail. Voicemail states to give a call back to [place] with [facility] at [phone]. !!!Please inform patient ofPathologist review finds elevated white blood cell count likely due to reactive lymphocytes. There are many causes to include virus, bacteria, parasitic, autoimmune, drug sensitivity, stress, smoking, malignancy, extreme exercise, and vaccinations. We may proceed with a few more tests at f/u but I believe her chronic sinusitis may likely be contributing to her persistent mild elevation of WBCs!!!

## 2017-07-26 NOTE — Telephone Encounter (Signed)
-----   Message from Loletta Specteroger David Gomez, PA-C sent at 07/26/2017 10:58 AM EST ----- Pathologist review finds elevated white blood cell count likely due to reactive lymphocytes. There are many causes to include virus, bacteria, parasitic, autoimmune, drug sensitivity, stress, smoking, malignancy, extreme exercise, and vaccinations. We may proceed with a few more tests at f/u but I believe her chronic sinusitis may likely be contributing to her persistent mild elevation of WBCs.

## 2017-08-30 ENCOUNTER — Ambulatory Visit (INDEPENDENT_AMBULATORY_CARE_PROVIDER_SITE_OTHER): Payer: Self-pay | Admitting: Physician Assistant

## 2017-12-24 ENCOUNTER — Telehealth (INDEPENDENT_AMBULATORY_CARE_PROVIDER_SITE_OTHER): Payer: Self-pay | Admitting: Physician Assistant
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
## 2017-12-24 NOTE — Telephone Encounter (Signed)
Yes, all medical prescriptions need a proper evaluation to have been done.

## 2017-12-26 ENCOUNTER — Encounter (INDEPENDENT_AMBULATORY_CARE_PROVIDER_SITE_OTHER): Payer: Self-pay | Admitting: Physician Assistant

## 2017-12-26 ENCOUNTER — Other Ambulatory Visit: Payer: Self-pay

## 2017-12-26 ENCOUNTER — Ambulatory Visit (INDEPENDENT_AMBULATORY_CARE_PROVIDER_SITE_OTHER): Payer: Self-pay | Admitting: Physician Assistant

## 2017-12-26 VITALS — BP 119/81 | HR 71 | Temp 98.0°F | Ht 61.0 in | Wt 248.8 lb

## 2017-12-26 DIAGNOSIS — M545 Low back pain, unspecified: Secondary | ICD-10-CM

## 2017-12-26 DIAGNOSIS — Z760 Encounter for issue of repeat prescription: Secondary | ICD-10-CM

## 2017-12-26 MED ORDER — DICLOFENAC SODIUM 75 MG PO TBEC: 75.0000 mg | DELAYED_RELEASE_TABLET | Freq: Two times a day (BID) | ORAL | 0 refills | Status: DC

## 2017-12-26 MED ORDER — CETIRIZINE HCL 10 MG PO TABS: 10.0000 mg | ORAL_TABLET | Freq: Every day | ORAL | 6 refills | Status: DC

## 2017-12-26 MED ORDER — ACETAMINOPHEN 500 MG PO TABS: 1000.0000 mg | ORAL_TABLET | Freq: Three times a day (TID) | ORAL | 0 refills | Status: AC | PRN

## 2017-12-26 MED ORDER — FLUTICASONE PROPIONATE 50 MCG/ACT NA SUSP: 2.0000 | Freq: Every day | NASAL | 6 refills | Status: DC

## 2017-12-26 MED ORDER — OMEPRAZOLE 40 MG PO CPDR: 40.0000 mg | DELAYED_RELEASE_CAPSULE | Freq: Every day | ORAL | 3 refills | Status: DC

## 2017-12-26 MED FILL — DICLOFENAC SOD EC 75 MG TAB: 75 | 15 days supply | Qty: 30 | Fill #0

## 2017-12-26 MED FILL — OMEPRAZOLE DR 40 MG CAPSULE: 40 | 30 days supply | Qty: 30 | Fill #0

## 2017-12-26 MED FILL — FLUTICASONE PROP 50 MCG SPR: 50 | 30 days supply | Qty: 16 | Fill #0
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
## 2017-12-26 NOTE — Patient Instructions (Signed)
Esguince lumbar con rehabilitacin (Low Back Sprain With Rehab) Un esguince se produce cuando se estiran las bandas de tejido que ConocoPhillipsmantienen unidos los huesos y las articulaciones (ligamentos). Los esguinces en la parte inferior de la espalda (columna lumbar) son Neomia Dearuna causa frecuente de dolor lumbar. Un esguince ocurre cuando los ligamentos se hiperdistienden o se estiran ms all de su lmite. Los ligamentos se pueden inflamar y, por lo tanto, Development worker, international aidprovocar dolor y Engineer, materialscontraccin repentina del msculo (espasmos). La causa de un esguince puede ser Neomia Dearuna lesin (traumatismo), o se puede desarrollar gradualmente debido al Aflac Incorporateduso excesivo. Hay tres tipos de esguince:  El grado1 es un esguince que involucra un ligamento hiperdistendido o un desgarro muy suave.  El grado2 es un esguince moderado que se caracteriza por un desgarro parcial del ligamento.  El grado3 es un esguince grave en el que se produjo un desgarro total del ligamento. CAUSAS Esta afeccin puede ser causada por lo siguiente:  Traumatismo, debido, por ejemplo, a una cada o a un golpe en el cuerpo.  Torsin o hiperdistensin de la espalda. Esto puede ser el resultado de realizar actividades que requieren mucha energa, como levantar objetos pesados. FACTORES DE RIESGO Los siguientes factores pueden aumentar el riesgo de sufrir esta afeccin:  Education administratorracticar deportes de contacto.  Participar en deportes o actividades que sobrecargan la espalda e implican mucha flexin y torsin, por ejemplo: ? Levantar pesas u objetos pesados. ? Gimnasia. ? Ftbol. ? Patinaje artstico. ? Snowboard.  Tener exceso de [place] u obesidad.  Tener poca fuerza y flexibilidad. SNTOMAS Los sntomas de esta afeccin pueden incluir los siguientes:  Dolor agudo o sordo en la parte inferior de la espalda que no desaparece. El dolor se puede extender Omnicarehacia las nalgas.  Rigidez.  Amplitud de movimientos limitada.  Incapacidad para pararse derecho debido a la  rigidez o al Merck & Codolor.  Espasmos musculares. DIAGNSTICO Esta afeccin se puede diagnosticar en funcin de lo siguiente:  Sus sntomas.  Sus antecedentes mdicos.  Un examen fsico. ? El mdico puede presionar sobre ciertas zonas de la espalda para determinar el origen de su dolor. ? Le puede pedir que se incline Kellogghacia adelante, Shelbyhacia atrs y de un lado al otro para Development worker, communityevaluar la intensidad del dolor y la amplitud de Newcastlemovimiento.  Pruebas de diagnstico por imgenes, como: ? Radiografas. ? Resonancia magntica (RM). TRATAMIENTO El tratamiento de esta afeccin puede incluir lo siguiente:  Corporate treasurerAplicar calor y fro en la zona afectada.  Tomar medicamentos para Engineer, materialsaliviar el dolor y International aid/development workerrelajar los msculos (relajantes musculares).  Tomar antiinflamatorios no esteroides Murriel Hopper(AINE) para ayudar a Building services engineerreducir la hinchazn y las molestias.  Realizar fisioterapia. Cuando los sntomas mejoran, es importante volver a su rutina habitual tan pronto como sea posible para reducir Chief Technology Officerel dolor, y Automotive engineerevitar la rigidez y la prdida de fuerza muscular. En general, los sntomas deberan mejorar en 6semanas de tratamiento. Sin embargo, el tiempo de recuperacin vara. INSTRUCCIONES PARA EL CUIDADO EN EL HOGAR Control del dolor, de la rigidez y de la hinchazn  Si se lo indic el mdico, aplique hielo en la zona afectada durante las primeras 24horas despus de la lesin. ? Ponga el hielo en una bolsa plstica. ? Coloque una FirstEnergy Corptoalla entre la piel y la bolsa de hielo. ? Coloque el hielo durante 20minutos, 2 a 3veces por da.  Si se lo indican, aplique calor en la zona afectada tan frecuentemente como se lo haya indicado el mdico. Use la fuente de calor que el mdico le recomiende, Aon Corporationcomo una  compresa de calor hmedo o una almohadilla trmica. ? Coloque una FirstEnergy Corp piel y la fuente de Airline pilot. ? Aplique el calor durante 20 a . ? Retire la fuente de calor si la piel se le pone de color rojo brillante. Esto es muy  importante si no puede sentir el dolor, el calor o el fro. Puede correr un riesgo mayor de sufrir quemaduras. Actividad  Descanse y retome sus actividades normales como se lo haya indicado el mdico. Pregntele al mdico qu actividades son seguras para usted.  Evite las actividades que demandan mucho esfuerzo (que son extenuantes) durante el tiempo que le haya indicado el mdico.  Haga ejercicios como se lo haya indicado el mdico. Instrucciones generales  CenterPoint Energy medicamentos de venta libre y los recetados solamente como se lo haya indicado el mdico.  Si tiene alguna pregunta o inquietud sobre la seguridad mientras toma analgsicos, hable con el mdico.  No conduzca ni opere maquinaria pesada hasta saber cmo lo afectan los analgsicos.  No consuma ningn producto que contenga tabaco, lo que incluye cigarrillos, tabaco de Theatre manager y Administrator, Civil Service. El tabaco puede retrasar el proceso de curacin. Si necesita ayuda para dejar de fumar, consulte al mdico.  Concurra a todas las visitas de control como se lo haya indicado el mdico. Esto es importante. PREVENCIN  Precaliente y elongue adecuadamente antes de la [place].  Reljese y elongue despus de realizar [place].  Dele a su cuerpo tiempo para Saks Incorporated perodos de [place].  Evite lo siguiente: ? Estar fsicamente inactivo durante perodos prolongados. ? Hacer ejercicio o practicar deportes cuando est cansado o dolorido.  Practique deportes y levante objetos pesados de la forma correcta.  Adopte una buena postura mientras est sentado y de pie.  Mantenga un peso saludable.  Duerma en un colchn de firmeza media para apoyar la columna.  Asegrese de Chemical engineer un equipo apto para usted, incluidos zapatos que [employer].  Tome medidas de seguridad y sea responsable al hacer Ether Griffins, para evitar las cadas.  Haga por lo menos de ejercicios de intensidad moderada cada semana,  como caminar a paso ligero o hacer gimnasia acutica. Pruebe alguna forma de ejercicio que le quite tensin de la espalda, como nadar o Chemical engineer la bicicleta fija.  Mantenga un buen estado fsico, esto incluye lo siguiente: ? La fuerza. En particular, desarrolle y mantenga msculos abdominales fuertes. ? La flexibilidad. ? La capacidad cardiovascular. ? La resistencia. SOLICITE ATENCIN MDICA SI:  El dolor de espalda no mejora despus de 6semanas de [place].  Los sntomas empeoran. SOLICITE ATENCIN MDICA DE INMEDIATO SI:  El dolor de espalda es muy intenso.  Nota que no puede pararse o caminar.  Siente dolor en las piernas.  Siente debilidad en las nalgas o en las piernas.  Tiene problemas para Scientist, physiological miccin o los movimientos intestinales. Esta informacin no tiene Theme park manager el consejo del mdico. Asegrese de hacerle al mdico cualquier pregunta que tenga. Document Released: 03/29/2006 Document Revised: 10/27/2014 Document Reviewed: 03/24/2015 Elsevier Interactive Patient Education  2018 [employer].

## 2018-01-01 ENCOUNTER — Ambulatory Visit (HOSPITAL_COMMUNITY)
Admission: RE | Admit: 2018-01-01 | Discharge: 2018-01-01 | Disposition: A | Discharge status: Home / Self Care | Payer: Self-pay | Source: Ambulatory Visit | Attending: Physician Assistant | Admitting: Physician Assistant

## 2018-01-01 ENCOUNTER — Other Ambulatory Visit (INDEPENDENT_AMBULATORY_CARE_PROVIDER_SITE_OTHER): Payer: Self-pay | Admitting: Physician Assistant

## 2018-01-01 DIAGNOSIS — M545 Low back pain, unspecified: Secondary | ICD-10-CM

## 2018-01-02 ENCOUNTER — Telehealth (INDEPENDENT_AMBULATORY_CARE_PROVIDER_SITE_OTHER): Payer: Self-pay

## 2018-01-02 NOTE — Telephone Encounter (Signed)
Call placed using pacific interpreter, Lars MageJuan [phone]). Patient has been informed that no abnormalities of the lumbar spine were found on XR. Maryjean Mornempestt S Itzel Lowrimore, CMA

## 2018-01-02 NOTE — Telephone Encounter (Signed)
-----   Message from Loletta Specteroger David Gomez, PA-C sent at 01/01/2018  5:51 PM EDT ----- No abnormalities of the lumbar spine.

## 2018-01-21 ENCOUNTER — Ambulatory Visit (INDEPENDENT_AMBULATORY_CARE_PROVIDER_SITE_OTHER): Payer: Self-pay | Admitting: Physician Assistant

## 2018-01-24 ENCOUNTER — Ambulatory Visit (INDEPENDENT_AMBULATORY_CARE_PROVIDER_SITE_OTHER): Payer: Self-pay | Admitting: Physician Assistant

## 2018-03-07 ENCOUNTER — Ambulatory Visit (INDEPENDENT_AMBULATORY_CARE_PROVIDER_SITE_OTHER): Payer: Self-pay | Admitting: Physician Assistant

## 2018-03-07 ENCOUNTER — Other Ambulatory Visit: Payer: Self-pay

## 2018-03-07 ENCOUNTER — Encounter (INDEPENDENT_AMBULATORY_CARE_PROVIDER_SITE_OTHER): Payer: Self-pay | Admitting: Physician Assistant

## 2018-03-07 VITALS — BP 115/81 | HR 76 | Temp 97.6°F | Ht 61.0 in | Wt 253.8 lb

## 2018-03-07 DIAGNOSIS — B372 Candidiasis of skin and nail: Secondary | ICD-10-CM

## 2018-03-07 MED ORDER — KETOCONAZOLE 2 % EX CREA: 1.0000 "application " | TOPICAL_CREAM | Freq: Every day | CUTANEOUS | 0 refills | Status: DC

## 2018-03-07 MED ORDER — FLUCONAZOLE 150 MG PO TABS: 150.0000 mg | ORAL_TABLET | Freq: Once | ORAL | 0 refills | Status: AC

## 2018-03-07 MED FILL — **NUVARING VAGINAL RING: 0.12-0.015 | 28 days supply | Qty: 1 | Fill #4

## 2018-03-07 MED FILL — FLUCONAZOLE 150 MG TABS: 150 | 1 days supply | Qty: 1 | Fill #0

## 2018-03-07 MED FILL — KETOCONAZOLE 2% CREAM: 2 | 7 days supply | Qty: 15 | Fill #0

## 2018-03-07 NOTE — Progress Notes (Signed)
Subjective:  Patient ID: Nancy [name], female    DOB: 05-Oct-1980  Age: 37 y.o. MRN: [id number]  CC: burn   HPI  Nancy [name] is a 37 y.o. female with a medical history of chronic sinusitis, lymphocytosis, anemia, and gallstones presents with a "burn" on her lower abdomen and bilateral axilla. Says she wore a weight loss and sweating wrap around the lower abdomen. Thinks her axilla are "burnt" because heat from the abdomen had to be released from somewhere. Has been using aloe vera and bacitracin cream in the burned areas with relief of discomfort.    Outpatient Medications Prior to Visit  Medication Sig Dispense Refill  . cetirizine (ZYRTEC) 10 MG tablet Take 1 tablet (10 mg total) by mouth daily. 30 tablet 6  . diclofenac (VOLTAREN) 75 MG EC tablet Take 1 tablet (75 mg total) by mouth 2 (two) times daily. 30 tablet 0  . fluticasone (FLONASE) 50 MCG/ACT nasal spray Place 2 sprays into both nostrils daily. 16 g 6  . omeprazole (PRILOSEC) 40 MG capsule Take 1 capsule (40 mg total) by mouth daily. 30 capsule 3  . etonogestrel-ethinyl estradiol (NUVARING) 0.12-0.015 MG/24HR vaginal ring INSERT VAGINALLY AND LEAVE IN PLACE FOR 3 CONSECUTIVE WEEKS, THEN REMOVE FOR 1 WEEK. (Patient not taking: Reported on 03/07/2018) 3 each 6   No facility-administered medications prior to visit.      ROS Review of Systems  Constitutional: Negative for chills, fever and malaise/fatigue.  Eyes: Negative for blurred vision.  Respiratory: Negative for shortness of breath.   Cardiovascular: Negative for chest pain and palpitations.  Gastrointestinal: Negative for abdominal pain and nausea.  Genitourinary: Negative for dysuria and hematuria.  Musculoskeletal: Negative for joint pain and myalgias.  Skin: Positive for rash.  Neurological: Negative for tingling and headaches.  Psychiatric/Behavioral: Negative for depression. The patient is not nervous/anxious.     Objective:  BP 115/81 (BP Location:  Right Arm, Patient Position: Sitting, Cuff Size: Large)   Pulse 76   Temp 97.6 F (36.4 C) (Oral)   Ht 5\' 1"  (1.549 m)   Wt 253 lb 12.8 oz (115.1 kg)   SpO2 100%   BMI 47.96 kg/m   BP/Weight 03/07/2018 12/26/2017 07/19/2017  Systolic BP 115 119 119  Diastolic BP 81 81 85  Wt. (Lbs) 253.8 248.8 239.2  BMI 47.96 47.01 45.2      Physical Exam  Constitutional: She is oriented to person, place, and time.  Well developed, obese, NAD, polite  HENT:  Head: Normocephalic and atraumatic.  Eyes: No scleral icterus.  Neck: Normal range of motion. Neck supple. No thyromegaly present.  Pulmonary/Chest: Effort normal.  Musculoskeletal: She exhibits no edema.  Neurological: She is alert and oriented to person, place, and time.  Skin:  Erythematous patches with satellite lesions in the pannus and bilateral axilla. No bleeding or suppuration  Psychiatric: She has a normal mood and affect. Her behavior is normal. Thought content normal.  Vitals reviewed.    Assessment & Plan:    1. Candidal intertrigo - Likely precipitated through sweating and occlusion in intertrigonous areas. - ketoconazole (NIZORAL) 2 % cream; Apply 1 application topically daily.  Dispense: 60 g; Refill: 0   Meds ordered this encounter  Medications  . ketoconazole (NIZORAL) 2 % cream    Sig: Apply 1 application topically daily.    Dispense:  60 g    Refill:  0    Order Specific Question:   Supervising Provider    Answer:  NEWLIN, ENOBONG [[id number]]  . fluconazole (DIFLUCAN) 150 MG tablet    Sig: Take 1 tablet (150 mg total) by mouth once for 1 dose.    Dispense:  1 tablet    Refill:  0    Order Specific Question:   Supervising Provider    Answer:   Hoy Register [[id number]]    Follow-up: Return if symptoms worsen or fail to improve.   Loletta Specter PA

## 2018-03-07 NOTE — Patient Instructions (Signed)
Infecciones por hongos en la piel (Skin Yeast Infection) La infeccin por hongos en la piel es un trastorno en el que hay un desarrollo excesivo de unos hongos (Candida) que viven normalmente en la piel. Por lo general, este tipo de infeccin ocurre en reas de la piel que estn constantemente clidas y hmedas, como las axilas o la ingle. CAUSAS La causa de la infeccin es un cambio en el equilibrio normal de los hongos y las bacterias que viven en la piel. FACTORES DE RIESGO Es ms probable que esta afeccin se manifieste en:  Las personas obesas.  Las embarazadas.  Las mujeres que toman anticonceptivos.  Las personas con diabetes.  Las personas que toman antibiticos.  Las personas que toman corticoides.  Las personas desnutridas.  Las personas que tienen su sistema de defensa (inmunitario) debilitado.  Las personas mayores de 65aos. SNTOMAS Los sntomas de esta afeccin incluyen lo siguiente:  Una zona de la piel roja e hinchada.  Bultos en la piel.  Picazn. DIAGNSTICO Esta afeccin se diagnostica mediante la historia clnica y un examen fsico. El mdico puede raspar ligeramente la piel para tomar una muestra y analizarla con un microscopio a fin de determinar la presencia de hongos. TRATAMIENTO Esta afeccin se trata con medicamentos. Los medicamentos pueden ser de venta libre o con receta. Estos medicamentos pueden administrarse de la siguiente manera:  Por boca (va oral).  En forma de crema. INSTRUCCIONES PARA EL CUIDADO EN EL HOGAR  Tome o aplquese los medicamentos de venta libre y recetados solamente como se lo haya indicado el mdico.  Consuma ms yogur. Esto puede ayudar a evitar la recurrencia de la candidiasis.  Mantenga un peso saludable. Si necesita ayuda para bajar de peso, hable con el mdico.  Mantenga la piel limpia y seca.  Si tiene diabetes, mantenga bajo control el nivel de azcar en la sangre. SOLICITE ATENCIN MDICA SI:  Los  sntomas desaparecen y luego reaparecen.  Los sntomas no mejoran con el tratamiento.  Los sntomas empeoran.  La erupcin se extiende.  Tiene fiebre o siente escalofros.  Aparecen nuevos sntomas.  Tiene una nueva zona de enrojecimiento o calor en la piel. Esta informacin no tiene como fin reemplazar el consejo del mdico. Asegrese de hacerle al mdico cualquier pregunta que tenga. Document Released: 06/01/2011 Document Revised: 10/04/2015 Document Reviewed: 12/14/2014 Elsevier Interactive Patient Education  2018 [employer]  

## 2018-03-11 MED FILL — KETOCONAZOLE 2% CREAM: 2 | 21 days supply | Qty: 45 | Fill #1

## 2018-04-01 ENCOUNTER — Ambulatory Visit (INDEPENDENT_AMBULATORY_CARE_PROVIDER_SITE_OTHER): Payer: Self-pay | Admitting: Physician Assistant

## 2018-06-27 IMAGING — US US PELVIS COMPLETE
1 series · 15 of 25 positions shown · non-contrast
Comparison: None

CLINICAL DATA: Menometrorrhagia

EXAM:
TRANSABDOMINAL AND TRANSVAGINAL ULTRASOUND OF PELVIS
TECHNIQUE: Both transabdominal and transvaginal ultrasound examinations of the
pelvis were performed. Transabdominal technique was performed for
global imaging of the pelvis including uterus, ovaries, adnexal
regions, and pelvic cul-de-sac. It was necessary to proceed with
endovaginal exam following the transabdominal exam to visualize the
uterus and endometrium.

[Series 1: us pelvis complete · 15 of 70 slices shown]
[im 1/70]
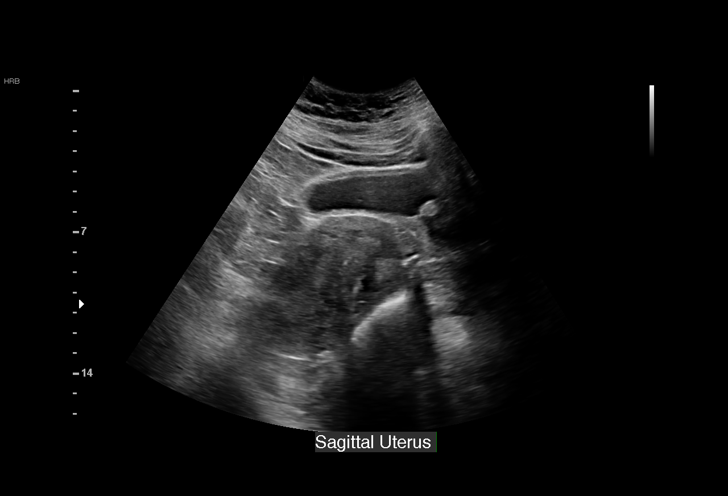
[im 6/70]
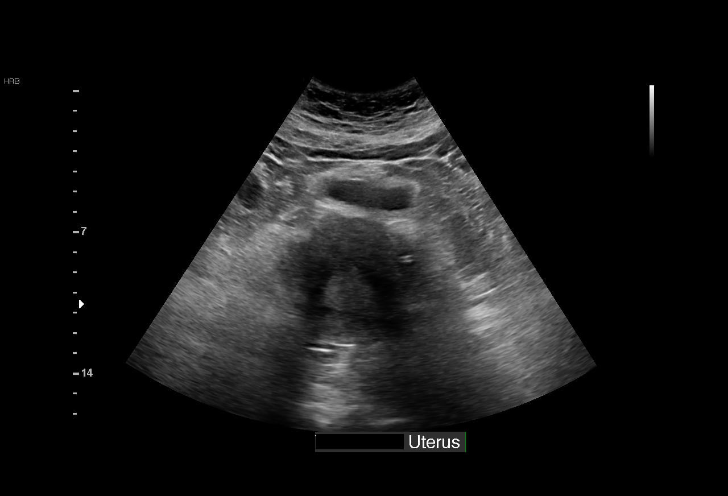
[im 12/70]
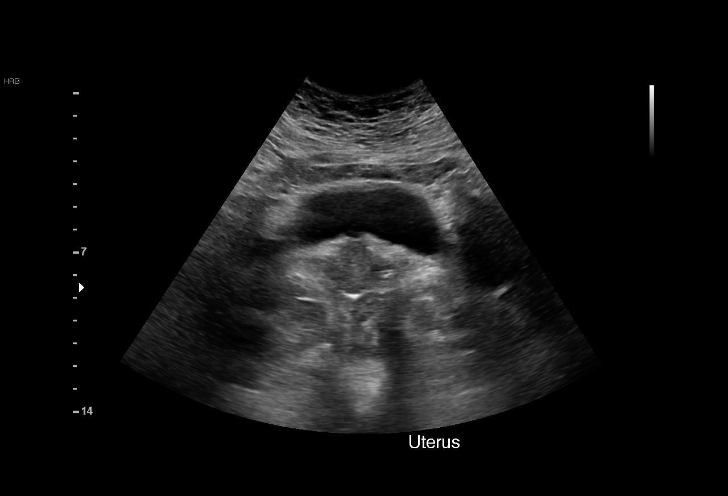
[im 15/70]
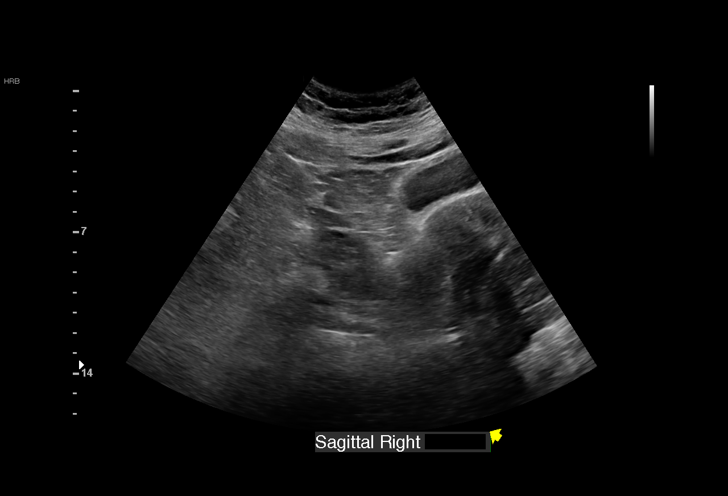
[im 21/70]
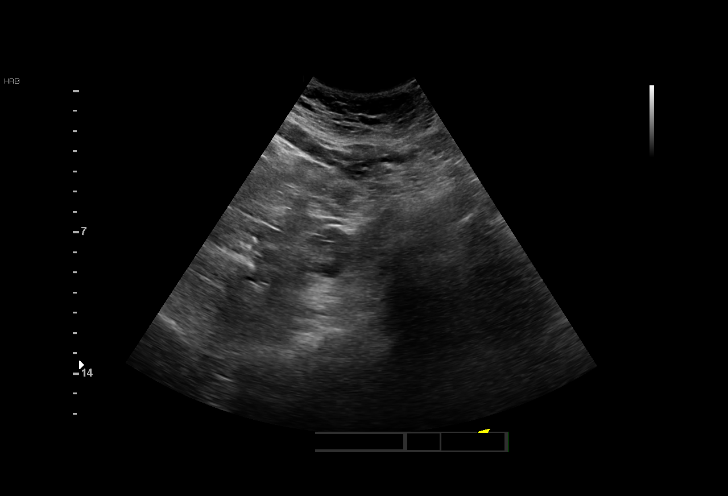
[im 26/70]
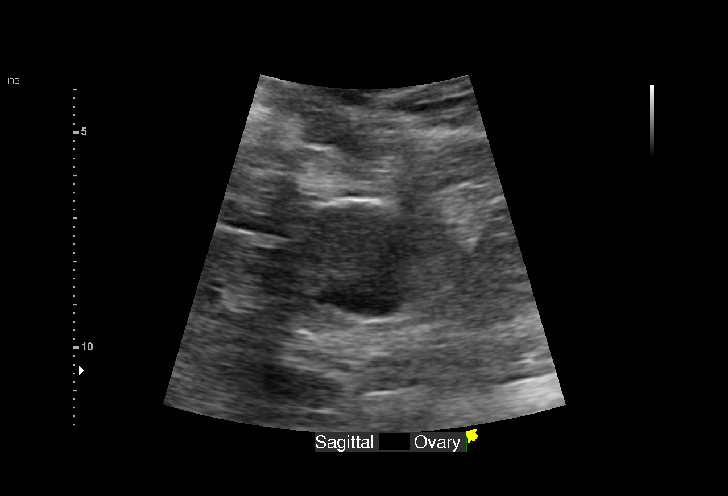
[im 29/70]
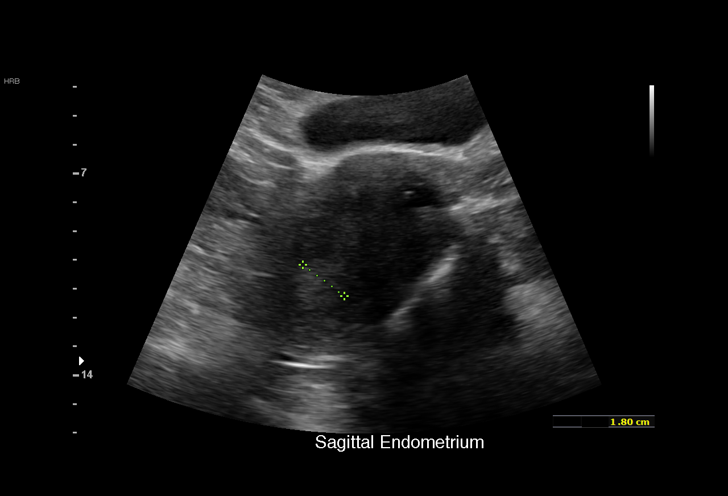
[im 35/70]
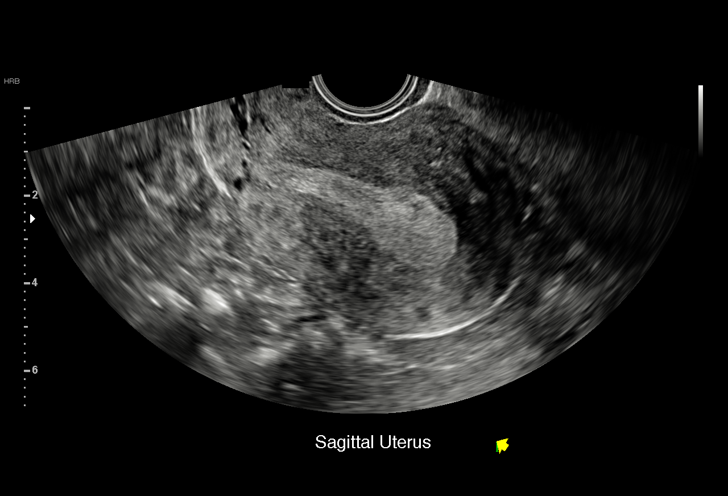
[im 41/70]
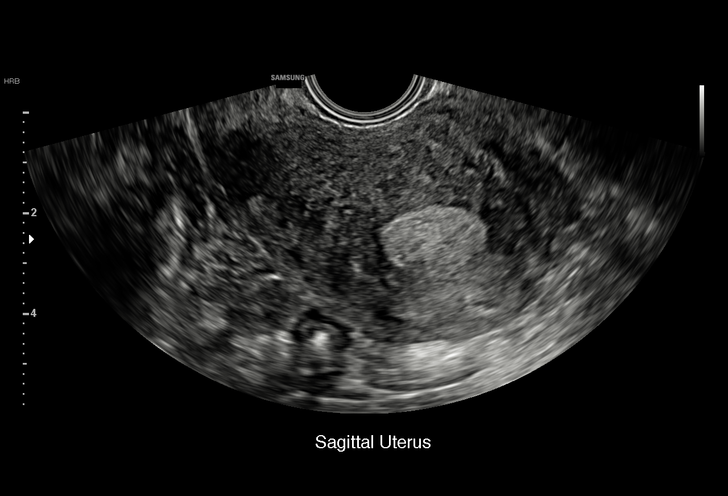
[im 44/70]
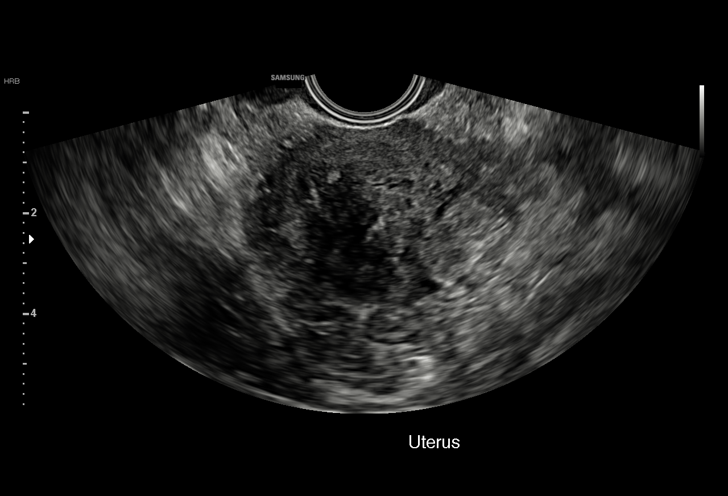
[im 49/70]
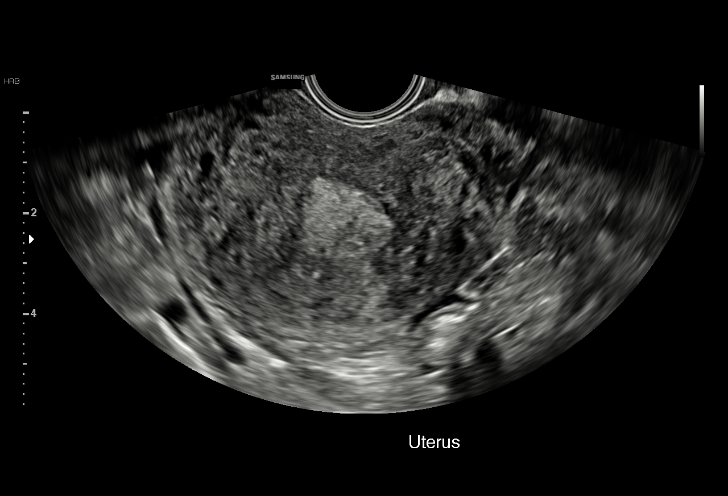
[im 55/70]
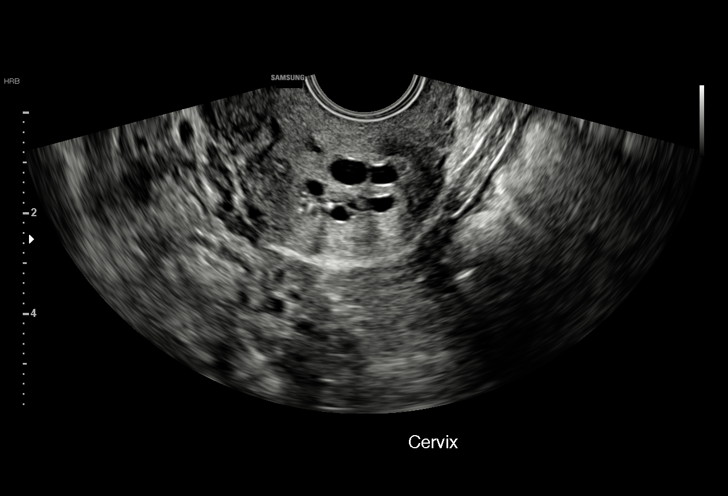
[im 58/70]
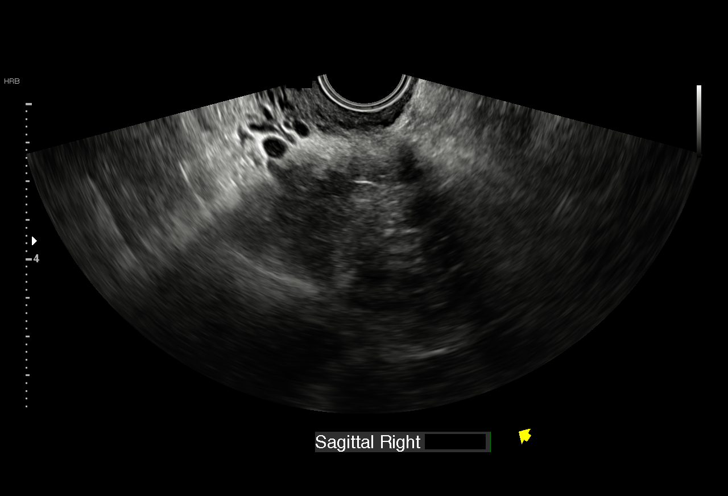
[im 64/70]
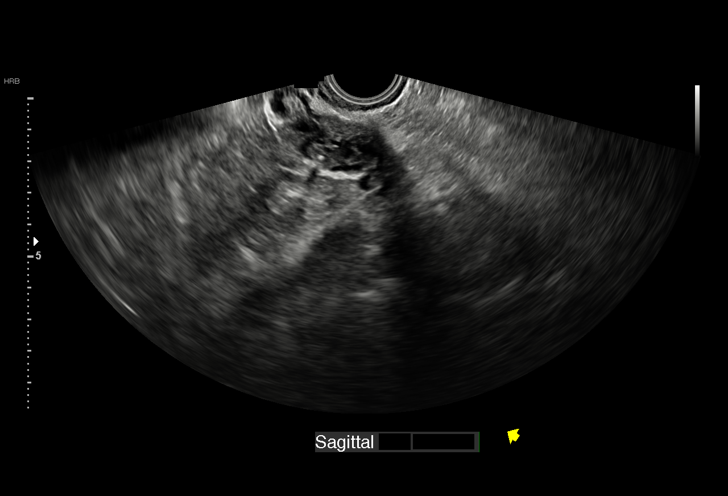
[im 70/70]
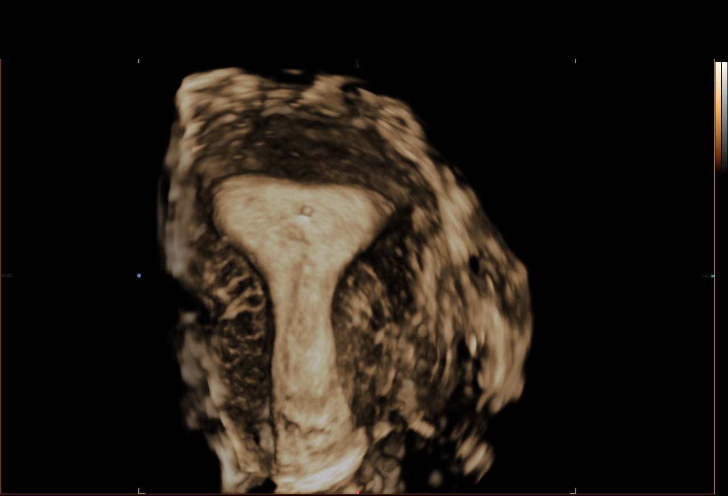

[15 of 25 positions shown; findings below may reference images not displayed]

FINDINGS: Uterus

Measurements: 8.9 x 5.2 x 6.7 cm. Appears retroverted.

Endometrium

Thickness: 18.2 mm. Mobile echogenic debris is identified within the
lumen of the endometrial cavity. No mass.

Right ovary

Measurements: 3.6 x 2.4 x 2.5 cm.. Normal appearance/no adnexal
mass.

Left ovary

Measurements: 3.2 x 2.1 x 2.4 cm. Normal appearance/no adnexal mass.

Other findings

No abnormal free fluid.
IMPRESSION: 1. The endometrium appears thickened measuring 18.2 mm. There is low
bowel echogenic debris within the lumen of the endometrial cavity
which may represent blood products. No focal abnormality identified.
If bleeding remains unresponsive to hormonal or medical therapy,
focal lesion work-up with sonohysterogram should be considered.
Endometrial biopsy should also be considered in pre-menopausal
patients at high risk for endometrial carcinoma. (Ref: Radiological
Reasoning: Algorithmic Workup of Abnormal Vaginal Bleeding with
Endovaginal Sonography and Sonohysterography. AJR [id number]; [id number])

## 2018-08-29 ENCOUNTER — Encounter (INDEPENDENT_AMBULATORY_CARE_PROVIDER_SITE_OTHER): Payer: Self-pay | Admitting: Primary Care

## 2018-08-29 ENCOUNTER — Other Ambulatory Visit: Payer: Self-pay | Admitting: Obstetrics & Gynecology

## 2018-08-29 ENCOUNTER — Other Ambulatory Visit: Payer: Self-pay

## 2018-08-29 ENCOUNTER — Ambulatory Visit (INDEPENDENT_AMBULATORY_CARE_PROVIDER_SITE_OTHER): Payer: Self-pay | Admitting: Primary Care

## 2018-08-29 VITALS — BP 122/80 | HR 92 | Temp 98.0°F | Ht 61.0 in | Wt 259.6 lb

## 2018-08-29 DIAGNOSIS — Z6841 Body Mass Index (BMI) 40.0 and over, adult: Secondary | ICD-10-CM

## 2018-08-29 DIAGNOSIS — J0100 Acute maxillary sinusitis, unspecified: Secondary | ICD-10-CM

## 2018-08-29 DIAGNOSIS — N920 Excessive and frequent menstruation with regular cycle: Secondary | ICD-10-CM

## 2018-08-29 MED ORDER — FLUCONAZOLE 150 MG PO TABS: 150.0000 mg | ORAL_TABLET | Freq: Once | ORAL | 0 refills | Status: AC

## 2018-08-29 MED ORDER — ETONOGESTREL-ETHINYL ESTRADIOL 0.12-0.015 MG/24HR VA RING: VAGINAL_RING | VAGINAL | 0 refills | Status: DC

## 2018-08-29 MED ORDER — AZITHROMYCIN 1 G PO PACK: 1.0000 g | PACK | Freq: Once | ORAL | 0 refills | Status: DC

## 2018-08-29 MED ORDER — AZITHROMYCIN 250 MG PO TABS: ORAL_TABLET | ORAL | 0 refills | Status: DC

## 2018-08-29 MED ORDER — SALINE SPRAY 0.65 % NA SOLN: 1.0000 | NASAL | 0 refills | Status: DC | PRN

## 2018-08-29 MED FILL — AZITHROMYCIN 250 MG TABLET: 250 | 5 days supply | Qty: 6 | Fill #0

## 2018-08-29 MED FILL — FLUCONAZOLE 150 MG TABS: 150 | 1 days supply | Qty: 1 | Fill #0

## 2018-08-29 MED FILL — **NUVARING VAGINAL RING: 0.12-0.015 | 28 days supply | Qty: 1 | Fill #0

## 2018-08-29 NOTE — Progress Notes (Signed)
Acute Office Visit  Subjective:    Patient ID: Nancy [name], female    DOB: [birth year], 38 y.o.   MRN: [id number]  Chief Complaint  Patient presents with  . Sore Throat    sinus  . Dizziness    HPI Patient is in today for not feeling well tired, congestion with fever yesterday 100.2 took ibuprofen resolved.  Also, experiencing heavy menses and requesting a nuv ring.  Past medical history of chronic sinusitis, lymphocytosis, anemia, and gallstones.  Past Medical History:  Diagnosis Date  . Anemia   . Gallstones     Past Surgical History:  Procedure Laterality Date  . CESAREAN SECTION    . CHOLECYSTECTOMY N/A 10/13/2012   Procedure: LAPAROSCOPIC CHOLECYSTECTOMY WITH INTRAOPERATIVE CHOLANGIOGRAM;  Surgeon: Shelly Rubenstein, MD;  Location: [facility];  Service: General;  Laterality: N/A;    History reviewed. No pertinent family history.  Social History   Socioeconomic History  . Marital status: Married    Spouse name: Not on file  . Number of children: Not on file  . Years of education: Not on file  . Highest education level: Not on file  Occupational History  . Not on file  Social Needs  . Financial resource strain: Not on file  . Food insecurity:    Worry: Not on file    Inability: Not on file  . Transportation needs:    Medical: Not on file    Non-medical: Not on file  Tobacco Use  . Smoking status: Never Smoker  . Smokeless tobacco: Never Used  Substance and Sexual Activity  . Alcohol use: No  . Drug use: No  . Sexual activity: Yes    Birth control/protection: None  Lifestyle  . Physical activity:    Days per week: Not on file    Minutes per session: Not on file  . Stress: Not on file  Relationships  . Social connections:    Talks on phone: Not on file    Gets together: Not on file    Attends religious service: Not on file    Active member of club or organization: Not on file    Attends meetings of clubs or organizations: Not on file   Relationship status: Not on file  . Intimate partner violence:    Fear of current or ex partner: Not on file    Emotionally abused: Not on file    Physically abused: Not on file    Forced sexual activity: Not on file  Other Topics Concern  . Not on file  Social History Narrative  . Not on file    Outpatient Medications Prior to Visit  Medication Sig Dispense Refill  . cetirizine (ZYRTEC) 10 MG tablet Take 1 tablet (10 mg total) by mouth daily. (Patient not taking: Reported on 08/29/2018) 30 tablet 6  . fluticasone (FLONASE) 50 MCG/ACT nasal spray Place 2 sprays into both nostrils daily. (Patient not taking: Reported on 08/29/2018) 16 g 6  . diclofenac (VOLTAREN) 75 MG EC tablet Take 1 tablet (75 mg total) by mouth 2 (two) times daily. 30 tablet 0  . etonogestrel-ethinyl estradiol (NUVARING) 0.12-0.015 MG/24HR vaginal ring INSERT VAGINALLY AND LEAVE IN PLACE FOR 3 CONSECUTIVE WEEKS, THEN REMOVE FOR 1 WEEK. (Patient not taking: Reported on 08/29/2018) 3 each 6  . ketoconazole (NIZORAL) 2 % cream Apply 1 application topically daily. 60 g 0  . omeprazole (PRILOSEC) 40 MG capsule Take 1 capsule (40 mg total) by mouth daily. 30 capsule 3  No facility-administered medications prior to visit.     Allergies  Allergen Reactions  . Penicillins Swelling and Rash   Review of Systems  Constitutional: Negative.   HENT: Positive for sinus pain.   Respiratory: Negative.   Cardiovascular: Negative.   Musculoskeletal: Positive for neck pain.  Skin: Negative.        Objective:    Physical Exam  Constitutional: She is oriented to person, place, and time. She appears well-developed and well-nourished.  HENT:  Head: Normocephalic.  Neck: Normal range of motion. Neck supple.    Cardiovascular: Normal rate and regular rhythm.  Pulmonary/Chest: Effort normal and breath sounds normal.  Abdominal: Soft. She exhibits distension.  Musculoskeletal: Normal range of motion.  Neurological: She is alert  and oriented to person, place, and time.  Skin: Skin is dry.  Psychiatric: She has a normal mood and affect.    BP 122/80 (BP Location: Right Arm, Patient Position: Sitting, Cuff Size: Large)   Pulse 92   Temp 98 F (36.7 C) (Oral)   Ht 5\' 1"  (1.549 m)   Wt 259 lb 9.6 oz (117.8 kg)   LMP 08/12/2018 (Exact Date)   SpO2 99%   BMI 49.05 kg/m  Wt Readings from Last 3 Encounters:  08/29/18 259 lb 9.6 oz (117.8 kg)  03/07/18 253 lb 12.8 oz (115.1 kg)  12/26/17 248 lb 12.8 oz (112.9 kg)    Health Maintenance Due  Topic Date Due  . TETANUS/TDAP  03/08/1999  . PAP SMEAR-Modifier  06/17/2017  . INFLUENZA VACCINE  01/24/2018    There are no preventive care reminders to display for this patient.   Lab Results  Component Value Date   TSH 1.810 02/01/2017   Lab Results  Component Value Date   WBC 12.0 (H) 07/19/2017   HGB 11.3 07/19/2017   HCT 36.4 07/19/2017   MCV 83 07/19/2017   PLT 439 (H) 07/19/2017   Lab Results  Component Value Date   NA 138 02/01/2017   K 4.3 02/01/2017   CO2 23 02/01/2017   GLUCOSE 85 02/01/2017   BUN 10 02/01/2017   CREATININE 0.57 02/01/2017   BILITOT 0.5 02/01/2017   ALKPHOS 98 02/01/2017   AST 19 02/01/2017   ALT 14 02/01/2017   PROT 7.6 02/01/2017   ALBUMIN 4.3 02/01/2017   CALCIUM 8.9 02/01/2017   Lab Results  Component Value Date   CHOL 156 06/14/2015   Lab Results  Component Value Date   HDL 17 (L) 06/14/2015   Lab Results  Component Value Date   LDLCALC 102 06/14/2015   Lab Results  Component Value Date   TRIG 185 (H) 06/14/2015   Lab Results  Component Value Date   CHOLHDL 9.2 (H) 06/14/2015   Lab Results  Component Value Date   HGBA1C 5.5 02/01/2017       Assessment & Plan:  1. Menorrhagia with regular cycle Refilled nuv ring GYN appt Monday   2. Subacute maxillary sinusitis tx with abt's and NS nasal spray   3. Obesity, Class III, BMI 40-49.9 (morbid obesity) (HCC) Diet and excercise Problem List  Items Addressed This Visit    Obesity, Class III, BMI 40-49.9 (morbid obesity) (HCC)   Menorrhagia with regular cycle - Primary    Other Visit Diagnoses    Subacute maxillary sinusitis       Relevant Medications   azithromycin (ZITHROMAX) 1 g powder   fluconazole (DIFLUCAN) 150 MG tablet   sodium chloride (OCEAN) 0.65 % SOLN nasal spray  Meds ordered this encounter  Medications  . azithromycin (ZITHROMAX) 1 g powder    Sig: Take 1 packet (1 g total) by mouth once for 1 dose.    Dispense:  1 each    Refill:  0  . fluconazole (DIFLUCAN) 150 MG tablet    Sig: Take 1 tablet (150 mg total) by mouth once for 1 dose.    Dispense:  1 tablet    Refill:  0  . etonogestrel-ethinyl estradiol (NUVARING) 0.12-0.015 MG/24HR vaginal ring    Sig: Insert vaginally and leave in place for 3 consecutive weeks, then remove for 1 week.    Dispense:  1 each    Refill:  0  . sodium chloride (OCEAN) 0.65 % SOLN nasal spray    Sig: Place 1 spray into both nostrils as needed for congestion.    Dispense:  1 Bottle    Refill:  0     Grayce Sessions, NP

## 2018-08-29 NOTE — Addendum Note (Signed)
Addended by: Maryjean Morn on: 08/29/2018 11:47 AM   Modules accepted: Orders

## 2018-09-02 ENCOUNTER — Ambulatory Visit: Payer: Self-pay | Admitting: Advanced Practice Midwife

## 2018-09-06 ENCOUNTER — Ambulatory Visit (INDEPENDENT_AMBULATORY_CARE_PROVIDER_SITE_OTHER): Payer: Self-pay | Admitting: Primary Care

## 2018-09-20 ENCOUNTER — Telehealth (INDEPENDENT_AMBULATORY_CARE_PROVIDER_SITE_OTHER): Payer: Self-pay

## 2018-09-20 ENCOUNTER — Other Ambulatory Visit: Payer: Self-pay | Admitting: Primary Care

## 2018-09-20 MED ORDER — ETONOGESTREL-ETHINYL ESTRADIOL 0.12-0.015 MG/24HR VA RING: VAGINAL_RING | VAGINAL | 9 refills | Status: DC

## 2018-09-20 MED FILL — **NUVARING VAGINAL RING: 0.12-0.015 | 28 days supply | Qty: 1 | Fill #0

## 2018-09-20 MED FILL — FLUTICASONE PROP 50 MCG SPR: 50 | 30 days supply | Qty: 16 | Fill #1

## 2018-09-20 NOTE — Telephone Encounter (Signed)
Left patient a voicemail that nuvaring had been refilled and sent to pharmacy. Maryjean Morn, CMA

## 2018-09-20 NOTE — Telephone Encounter (Signed)
Patient requesting refill on her NuvaRing. Please send to [facility]. Maryjean Morn, CMA

## 2018-09-20 NOTE — Telephone Encounter (Signed)
Sent in [place]

## 2018-10-15 MED FILL — **NUVARING VAGINAL RING: 0.12-0.015 | 84 days supply | Qty: 3 | Fill #1

## 2018-11-05 MED FILL — OMEPRAZOLE DR 40 MG CAPSULE: 40 | 30 days supply | Qty: 30 | Fill #1

## 2018-11-26 ENCOUNTER — Telehealth (INDEPENDENT_AMBULATORY_CARE_PROVIDER_SITE_OTHER): Payer: Self-pay | Admitting: Primary Care

## 2018-11-26 NOTE — Telephone Encounter (Signed)
Patient called stating that her rash has returned under her axilla and stomach. Patient states that she had a little cream left from when PA. Lily Kocher prescribed her back in 2019. Patient stated the name of the cream is Ketoconazole 2% and it helped her. Patient will like to know if she can have a refill since the RX is expired. Patient was last seen in March of 2020.  Patient uses [facility]  Please advise [phone]  Thank you Louisa Second

## 2018-11-27 ENCOUNTER — Other Ambulatory Visit: Payer: Self-pay | Admitting: Primary Care

## 2018-11-27 MED ORDER — KETOCONAZOLE 2 % EX SHAM: 1.0000 "application " | MEDICATED_SHAMPOO | CUTANEOUS | 0 refills | Status: DC

## 2018-11-27 NOTE — Telephone Encounter (Signed)
Noted. Nancy [name] [address], CMA  

## 2018-11-27 NOTE — Telephone Encounter (Signed)
Med sentKetoconazole 2%

## 2018-11-27 NOTE — Telephone Encounter (Signed)
Patient has an appointment on June 5 @ 10:50. I will add to the appointment that patient has a rash.  Thank you Louisa Second

## 2018-11-27 NOTE — Telephone Encounter (Signed)
Please call and schedule patient an in person OV for rash. Maryjean Morn, CMA

## 2018-11-28 MED FILL — KETOCONAZOLE 2% SHAMPOO: 2 | 15 days supply | Qty: 120 | Fill #0

## 2018-11-28 NOTE — Telephone Encounter (Signed)
Patient is aware that medication has been sent to pharmacy. Maryjean Morn, CMA

## 2018-11-29 ENCOUNTER — Ambulatory Visit: Payer: Self-pay | Admitting: Primary Care

## 2018-12-03 ENCOUNTER — Telehealth (INDEPENDENT_AMBULATORY_CARE_PROVIDER_SITE_OTHER): Payer: Self-pay | Admitting: Primary Care

## 2018-12-03 ENCOUNTER — Other Ambulatory Visit: Payer: Self-pay

## 2018-12-03 MED ORDER — KETOCONAZOLE 2 % EX CREA: 1.0000 "application " | TOPICAL_CREAM | Freq: Every day | CUTANEOUS | 1 refills | Status: DC

## 2018-12-03 NOTE — Telephone Encounter (Signed)
Patient aware that script corrected and sent to pharmacy. Nat Christen, CMA

## 2018-12-03 NOTE — Telephone Encounter (Signed)
Patient called to inform that provider sent RX for shampoo instead of Cream. Patient is requesting a RX for Ketoconazole 2% Cream.  Patient uses [facility]  Please advice [phone]  Thank you Nancy [name]

## 2018-12-04 MED FILL — KETOCONAZOLE 2% CREAM: 2 | 15 days supply | Qty: 15 | Fill #0

## 2018-12-11 ENCOUNTER — Ambulatory Visit (INDEPENDENT_AMBULATORY_CARE_PROVIDER_SITE_OTHER): Payer: Self-pay | Admitting: Primary Care

## 2019-03-10 MED FILL — NUVARING VAGINAL RING: 0.12-0.015 | 28 days supply | Qty: 1 | Fill #2

## 2019-03-10 MED FILL — KETOCONAZOLE 2% CREAM: 2 | 15 days supply | Qty: 15 | Fill #1

## 2019-03-19 ENCOUNTER — Other Ambulatory Visit: Payer: Self-pay | Admitting: Primary Care

## 2019-03-19 MED FILL — KETOCONAZOLE 2% CREAM: 2 | 7 days supply | Qty: 15 | Fill #0

## 2019-03-24 ENCOUNTER — Other Ambulatory Visit: Payer: Self-pay | Admitting: Primary Care

## 2019-03-24 MED FILL — KETOCONAZOLE 2% CREAM: 2 | 7 days supply | Qty: 15 | Fill #1

## 2019-03-27 ENCOUNTER — Other Ambulatory Visit: Payer: Self-pay | Admitting: Pharmacist

## 2019-03-27 MED ORDER — KETOCONAZOLE 2 % EX CREA: 1.0000 "application " | TOPICAL_CREAM | Freq: Every day | CUTANEOUS | 1 refills | Status: DC

## 2019-03-27 MED FILL — KETOCONAZOLE 2% CREAM: 2 | 10 days supply | Qty: 15 | Fill #0

## 2019-03-28 MED FILL — KETOCONAZOLE 2% CREAM: 2 | 10 days supply | Qty: 15 | Fill #1 | Status: TO

## 2019-03-31 ENCOUNTER — Telehealth (INDEPENDENT_AMBULATORY_CARE_PROVIDER_SITE_OTHER): Payer: Self-pay | Admitting: Primary Care

## 2019-03-31 ENCOUNTER — Encounter (INDEPENDENT_AMBULATORY_CARE_PROVIDER_SITE_OTHER): Payer: Self-pay | Admitting: Primary Care

## 2019-03-31 DIAGNOSIS — B372 Candidiasis of skin and nail: Secondary | ICD-10-CM

## 2019-03-31 MED ORDER — FLUCONAZOLE 150 MG PO TABS: ORAL_TABLET | ORAL | 0 refills | Status: DC

## 2019-03-31 MED ORDER — KETOCONAZOLE 2 % EX CREA: 1.0000 "application " | TOPICAL_CREAM | Freq: Every day | CUTANEOUS | 1 refills | Status: DC

## 2019-03-31 NOTE — Progress Notes (Signed)
Virtual Visit via Telephone Note  I connected with Nancy [name] on 03/31/19 at  4:10 PM EDT by telephone and verified that I am speaking with the correct person using two identifiers.   I discussed the limitations, risks, security and privacy concerns of performing an evaluation and management service by telephone and the availability of in person appointments. I also discussed with the patient that there may be a patient responsible charge related to this service. The patient expressed understanding and agreed to proceed.   History of Present Illness: Nancy [name] is having web visit for a similar problem last year in September. She has a rash under the folds of her stomach and in groin area.    Past Medical History:  Diagnosis Date  . Anemia   . Gallstones    Observations/Objective: Review of Systems  Skin: Positive for itching and rash.       Folds and groin area  All other systems reviewed and are negative.   Assessment and Plan: Nancy [name] was seen today for rash.  Diagnoses and all orders for this visit:  Candidal intertrigo Candia in the folds of her skin explained yeast thrives in moist , dark areas. Keep skin as dry as possible shower after she goes to the gym use constarch product to reduce moisture, wear cotton panties and none at night to allow skin to breath. Also, no tight pants. Prescribed diflucan 150 mg for 5 days and ketoconazole apply to affected areas twice a day 60 gram ordered    Other orders -     ketoconazole (NIZORAL) 2 % cream; Apply 1 application topically daily. -     fluconazole (DIFLUCAN) 150 MG tablet; Take 1 pill daily for 5 days    Follow Up Instructions:    I discussed the assessment and treatment plan with the patient. The patient was provided an opportunity to ask questions and all were answered. The patient agreed with the plan and demonstrated an understanding of the instructions.   The patient was advised to call back or seek an  in-person evaluation if the symptoms worsen or if the condition fails to improve as anticipated.  I provided 12 minutes of non-face-to-face time during this encounter.   Kerin Perna, NP

## 2019-03-31 NOTE — Progress Notes (Signed)
Would prefer a pill to treat possible yeast on skin.

## 2019-09-22 ENCOUNTER — Other Ambulatory Visit: Payer: Self-pay | Admitting: Obstetrics & Gynecology

## 2019-09-22 ENCOUNTER — Encounter: Payer: Self-pay | Admitting: *Deleted

## 2019-09-22 MED FILL — *NUVARING VAGINAL RING: 0.12-0.015 | 28 days supply | Qty: 1 | Fill #0

## 2019-09-24 ENCOUNTER — Other Ambulatory Visit: Payer: Self-pay

## 2019-09-24 MED ORDER — MEGESTROL ACETATE 40 MG PO TABS: 40.0000 mg | ORAL_TABLET | Freq: Every day | ORAL | 0 refills | Status: DC

## 2019-09-24 MED FILL — MEGESTROL 40 MG TABLET: 40 | 10 days supply | Qty: 10 | Fill #0

## 2019-09-24 NOTE — Progress Notes (Signed)
Patient states she been bleeding for 10 days. States she currently has the Nuvaring and was wondering what to do. Advised by Dr.Pratt to have the patient 40mg  megace for 10 days and to change the nuvaring as instructed. If the bleeding doesn't subside within 10 days to call the office to be seen. [facility] and verbalize understanding and ended the call. Speaks English very well.

## 2019-10-01 ENCOUNTER — Other Ambulatory Visit (INDEPENDENT_AMBULATORY_CARE_PROVIDER_SITE_OTHER): Payer: Self-pay | Admitting: Primary Care

## 2019-10-01 MED ORDER — OMEPRAZOLE 20 MG PO CPDR: 20.0000 mg | DELAYED_RELEASE_CAPSULE | Freq: Every day | ORAL | 3 refills | Status: DC

## 2019-10-01 MED FILL — OMEPRAZOLE 20 MG CAP: 20 | 30 days supply | Qty: 30 | Fill #0

## 2019-10-20 ENCOUNTER — Telehealth: Payer: Self-pay | Admitting: Obstetrics & Gynecology

## 2019-10-20 ENCOUNTER — Other Ambulatory Visit: Payer: Self-pay | Admitting: Family Medicine

## 2019-10-20 MED FILL — NUVARING VAGINAL RING: 0.12-0.015 | 28 days supply | Qty: 1 | Fill #1

## 2019-10-20 MED FILL — MEGESTROL 40 MG TABLET: 40 | 10 days supply | Qty: 10 | Fill #0

## 2019-10-20 NOTE — Telephone Encounter (Signed)
Patient is bleeding with Birth Control and want something to stop it

## 2019-10-21 NOTE — Telephone Encounter (Signed)
Called pt with [employer] # [phone] and left message that I am returning her call if she continues to have questions or concerns to please give the office a call.  Re: Pt has a Rx sent for Megace.   Addison Naegeli, RN

## 2019-11-04 ENCOUNTER — Telehealth (INDEPENDENT_AMBULATORY_CARE_PROVIDER_SITE_OTHER): Payer: Self-pay | Admitting: Primary Care

## 2019-11-18 MED FILL — OMEPRAZOLE 20 MG CAP: 20 | 30 days supply | Qty: 30 | Fill #1

## 2019-12-24 ENCOUNTER — Ambulatory Visit (INDEPENDENT_AMBULATORY_CARE_PROVIDER_SITE_OTHER): Payer: Self-pay | Admitting: *Deleted

## 2019-12-24 NOTE — Telephone Encounter (Signed)
Pt called with complaints of bloating since 12/21/19; she says this happened a year ago when she was under stress but it only lasted 3 days; the pt states she is uncomfortable, and worse when she eats; she does have abdominal pain, and cramping in her upper stomach; the pt says she looks 3 months pregnant; she is not having constipation; the pt has taken omeprazole; she rates her discomfort and pain at 9 out of 10; she denies fever; the pt also says that she is eating small amounts of food because or her symptoms; recommendations made per nurse triage protocol; she verbalized understanding but would like to be seen in the office; the sees Gwinda Passe, [employer], spoke with Malachi Bonds regarding scheduling; pt transferred to [place] for scheduling due to difficulty hearing both parties due to static in the line; will route to office for notification.  Reason for Disposition . [1] SEVERE pain (e.g., excruciating) AND [2] present > 1 hour  Answer Assessment - Initial Assessment Questions 1. LOCATION: "Where does it hurt?"      All over 2. RADIATION: "Does the pain shoot anywhere else?" (e.g., chest, back)     no 3. ONSET: "When did the pain begin?" (e.g., minutes, hours or days ago)     12/21/19 4. SUDDEN: "Gradual or sudden onset?"     5. PATTERN "Does the pain come and go, or is it constant?"    - If constant: "Is it getting better, staying the same, or worsening?"      (Note: Constant means the pain never goes away completely; most serious pain is constant and it progresses)     - If intermittent: "How long does it last?" "Do you have pain now?"     (Note: Intermittent means the pain goes away completely between bouts)      constant 6. SEVERITY: "How bad is the pain?"  (e.g., Scale 1-10; mild, moderate, or severe)   - MILD (1-3): doesn't interfere with normal activities, abdomen soft and not tender to touch    - MODERATE (4-7): interferes with normal activities or awakens from sleep,  tender to touch    - SEVERE (8-10): excruciating pain, doubled over, unable to do any normal activities      9 out of 10 7. RECURRENT SYMPTOM: "Have you ever had this type of stomach pain before?" If Yes, ask: "When was the last time?" and "What happened that time?"       1 year ago due to stress 8. CAUSE: "What do you think is causing the stomach pain?"     no 9. RELIEVING/AGGRAVATING FACTORS: "What makes it better or worse?" (e.g., movement, antacids, bowel movement)    no 10. OTHER SYMPTOMS: "Has there been any vomiting, diarrhea, constipation, or urine problems?"      no 11. PREGNANCY: "Is there any chance you are pregnant?" "When was your last menstrual period?"      No, LMP 12/03/19; pt uses nuva ring  Protocols used: ABDOMINAL PAIN - [facility]

## 2019-12-24 NOTE — Telephone Encounter (Signed)
Per Malachi Bonds pt scheduled for appt with Gwinda Passe 12/25/19 at 1410.

## 2019-12-25 ENCOUNTER — Other Ambulatory Visit: Payer: Self-pay

## 2019-12-25 ENCOUNTER — Encounter (INDEPENDENT_AMBULATORY_CARE_PROVIDER_SITE_OTHER): Payer: Self-pay | Admitting: Primary Care

## 2019-12-25 ENCOUNTER — Ambulatory Visit (INDEPENDENT_AMBULATORY_CARE_PROVIDER_SITE_OTHER): Payer: Self-pay | Admitting: Primary Care

## 2019-12-25 VITALS — BP 143/90 | HR 104 | Temp 98.0°F | Ht 61.0 in | Wt 259.0 lb

## 2019-12-25 DIAGNOSIS — F4323 Adjustment disorder with mixed anxiety and depressed mood: Secondary | ICD-10-CM

## 2019-12-25 DIAGNOSIS — J301 Allergic rhinitis due to pollen: Secondary | ICD-10-CM

## 2019-12-25 DIAGNOSIS — K2100 Gastro-esophageal reflux disease with esophagitis, without bleeding: Secondary | ICD-10-CM

## 2019-12-25 MED ORDER — OMEPRAZOLE 20 MG PO CPDR: 20.0000 mg | DELAYED_RELEASE_CAPSULE | Freq: Every day | ORAL | 1 refills | Status: DC

## 2019-12-25 MED ORDER — FLUOXETINE HCL 10 MG PO TABS: 20.0000 mg | ORAL_TABLET | Freq: Every day | ORAL | 0 refills | Status: DC

## 2019-12-25 MED FILL — FLUoxetine HCL 10 MG CAPS: 10 | 30 days supply | Qty: 60 | Fill #0

## 2019-12-25 MED FILL — NUVARING VAGINAL RING: 0.12-0.015 | 28 days supply | Qty: 1 | Fill #2

## 2019-12-25 MED FILL — OMEPRAZOLE 20 MG CAP: 20 | 30 days supply | Qty: 30 | Fill #0

## 2019-12-25 NOTE — Patient Instructions (Addendum)
Colelitiasis Cholelithiasis  La colelitiasis tambin recibe el nombre de "clculos biliares". Es un tipo de enfermedad de la vescula biliar. La vescula biliar es un rgano que almacena un lquido (bilis) que ayuda a Location manager las [place]. Los clculos biliares podran no causar sntomas (clculos biliares silenciosos) hasta provocar una obstruccin; luego, pueden causar dolor (ataque de la vescula biliar). Siga estas indicaciones en su casa:  Tome los medicamentos de venta libre y los recetados solamente como se lo haya indicado el mdico.  Mantenga un peso saludable.  Consuma alimentos saludables. Esto incluye lo siguiente: ? Comer una menor cantidad de alimentos grasos, como los alimentos fritos. ? Comer una menor cantidad de carbohidratos refinados. Los carbohidratos refinados son los panes y los cereales muy procesados, como el pan blanco y el arroz blanco. En cambio, elegir cereales integrales, como el pan integral o el arroz integral. ? Consumir ms fibra. [place], las frutas frescas y los frijoles son fuentes saludables de [place].  Concurra a todas las visitas de [address] se lo haya indicado el mdico. Esto es importante. Comunquese con un mdico si:  De repente, siente dolor en el costado superior derecho del vientre (abdomen). El dolor podra extenderse hasta el hombro derecho o el pecho. Estos pueden ser sntomas de un ataque de la vescula biliar.  Siente Programme researcher, broadcasting/film/video (tiene nuseas).  Devuelve (vomita).  Le han diagnosticado clculos biliares que no presentan sntomas y tiene lo siguiente: ? Dolor abdominal. ? Molestias, ardor o sensacin de plenitud en la parte superior del vientre (empacho). Solicite ayuda de inmediato si:  De repente, siente dolor en el costado superior derecho del vientre que dura ms de 2horas.  Tiene dolor abdominal que dura ms de 5horas.  Tiene fiebre o siente escalofros.  Sigue sintiendo nuseas o vomitando.  Nota que  la piel o la parte blanca del ojo estn amarillas (ictericia).  Hace pis (orina) de color oscuro.  Su materia fecal (heces) es de tono muy claro. Resumen  La colelitiasis tambin recibe el nombre de "clculos biliares".  La vescula biliar es un rgano que almacena un lquido (bilis) que ayuda a Location manager las [place].  Los clculos biliares silenciosos son clculos biliares que no causan sntomas.  Un ataque de la vescula biliar podra causar un dolor repentino en el costado superior derecho del vientre. El dolor podra extenderse hasta el hombro derecho o el pecho. Si esto ocurre, comunquese con el mdico.  Si le aparece un dolor repentino en el costado superior derecho del vientre que dura ms de 2horas, busque ayuda de inmediato. Esta informacin no tiene Theme park manager el consejo del mdico. Asegrese de hacerle al mdico cualquier pregunta que tenga. Document Revised: 12/11/2016 Document Reviewed: 12/04/2012 Elsevier Patient Education  2020 Elsevier Inc. Anlisis de anticuerpos a Helicobacter Pylori Helicobacter Pylori Antibodies Test Por qu me debo realizar esta prueba? Este anlisis se [place] para Landscape architect presencia de un tipo de bacteria llamada Helicobacter pylori (H. pylori). H. pylori es una bacteria que se encuentra en las clulas que recubren el [place]. Tener niveles elevados de H. pylori en el estmago lo pone en riesgo de tener:  lceras en el estmago y el intestino delgado.  Inflamacin a Air cabin crew (crnica) del recubrimiento del estmago.  lceras en la parte del cuerpo que [employer] alimentos desde la boca hacia el estmago (esfago).  Cncer de estmago si la infeccin no se trata. La mayora de las personas con H. pylori en el estmago no presentan sntomas.  El mdico puede indicarle que se haga este anlisis si tiene sntomas de Camera operator o del intestino delgado, por ejemplo, dolor de estmago antes o despus de comer, acidez estomacal o nuseas  despus de comer. Qu se analiza? Este anlisis estudia la sangre para ver si hay anticuerpos a la bacteria H. pylori. Los anticuerpos son protenas producidas por el sistema inmunitario para combatir microbios e infecciones. El anlisis busca anticuerpos que el sistema inmunitario produce en respuesta a la infeccin con H. pylori. Qu tipo de [place] se toma?  Para este anlisis, se extrae [place]. Por lo general, para extraerla, se introduce una aguja en un vaso sanguneo o se pincha un dedo con una aguja pequea. Informe al mdico acerca de lo siguiente:  Todos los [employer], incluidos vitaminas, hierbas, gotas oftlmicas, cremas y [address] de [address]. Cmo se informan los resultados? Los [employer] del anlisis se informarn como valores que se dividen en positivos, negativos o dudosos. El trmino dudoso significa que los [place] no son positivos ni negativos. El mdico comparar sus resultados con los rangos normales que se establecieron luego de Radio producer esta prueba a un grupo grande de personas (rangos de referencia). Los rangos de referencia pueden variar entre distintos laboratorios y hospitales. Para este anlisis, los rangos de referencia comunes para los dos tipos de anticuerpos que pueden analizarse son:  Anticuerpos inmunoglobulina G (IgG): ? Menor que 0,75unidades/ml. El resultado es negativo. ? Mayor o igual que 1unidad/ml. El resultado es positivo. ? De 0,75 a 0,99 unidades/ml. El resultado es dudoso.  Anticuerpos inmunoglobulina M: ? Menor o igual que 30unidades/ml. El resultado es negativo. ? Mayor o igual que 40unidades/ml. El resultado es positivo. ? De 30,01 a 39,99unidades/ml. El resultado es dudoso. Qu significan los resultados? Los [employer] del anlisis que superan los valores normales, o positivos, Associate Professor la presencia de diversas afecciones mdicas, como:  Irritacin a corto o largo plazo de la membrana  gstrica (gastritis).  lcera del intestino delgado.  lcera estomacal.  Cncer de estmago. Hable con el mdico sobre lo que [employer]. Preguntas para hacerle al mdico Consulte a su mdico o pregunte en el departamento donde se realiza la prueba acerca de lo siguiente:  Cundo estarn disponibles mis resultados?  Cmo obtendr mis resultados?  Cules son mis opciones de tratamiento?  Qu otras pruebas necesito?  Cules son los prximos pasos que debo seguir? Resumen  Este anlisis se [place] para detectar la presencia de un tipo de bacteria llamada Helicobacter pylori (H. pylori). Tener niveles elevados de la H. pylori en el estmago lo pone en riesgo de tener lceras en el tracto gastrointestinal o cncer de estmago.  Este anlisis estudia la sangre para ver si hay anticuerpos a la bacteria H. pylori.  La mayora de las personas con H. pylori en el estmago no presentan sntomas. El mdico puede indicarle que se haga este anlisis si tiene sntomas de Camera operator o del intestino delgado, por ejemplo, dolor de estmago antes o despus de comer, acidez estomacal o nuseas despus de comer.  Hable con el mdico sobre lo que [employer]. Esta informacin no tiene Theme park manager el consejo del mdico. Asegrese de hacerle al mdico cualquier pregunta que tenga. Document Revised: 04/10/2017 Document Reviewed: 04/10/2017 Elsevier Patient Education  2020 Elsevier Inc. Abdominal Bloating When you have abdominal bloating, your abdomen may feel full, tight, or painful. It may also look bigger than normal or swollen (distended). Common causes of  abdominal bloating include:  Swallowing air.  Constipation.  Problems digesting food.  Eating too much.  Irritable bowel syndrome. This is a condition that affects the large intestine.  Lactose intolerance. This is an inability to digest lactose, a natural sugar in dairy products.  Celiac  disease. This is a condition that affects the ability to digest gluten, a protein found in some grains.  Gastroparesis. This is a condition that slows down the movement of food in the stomach and small intestine. It is more common in people with diabetes mellitus.  Gastroesophageal reflux disease (GERD). This is a digestive condition that makes stomach acid flow back into the esophagus.  Urinary retention. This means that the body is holding onto urine, and the bladder cannot be emptied all the way. Follow these instructions at home: Eating and drinking  Avoid eating too much.  Try not to swallow air while talking or eating.  Avoid eating while lying down.  Avoid these foods and drinks: ? Foods that cause gas, such as broccoli, cabbage, cauliflower, and baked beans. ? Carbonated drinks. ? Hard candy. ? Chewing gum. Medicines  Take over-the-counter and prescription medicines only as told by your health care provider.  Take probiotic medicines. These medicines contain live bacteria or yeasts that can help digestion.  Take coated peppermint oil capsules. Activity  Try to exercise regularly. Exercise may help to relieve bloating that is caused by gas and relieve constipation. General instructions  Keep all follow-up visits as told by your health care provider. This is important. Contact a health care provider if:  You have nausea and vomiting.  You have diarrhea.  You have abdominal pain.  You have unusual weight loss or weight gain.  You have severe pain, and medicines do not help. Get help right away if:  You have severe chest pain.  You have trouble breathing.  You have shortness of breath.  You have trouble urinating.  You have darker urine than normal.  You have blood in your stools or have dark, tarry stools. Summary  Abdominal bloating means that the abdomen is swollen.  Common causes of abdominal bloating are swallowing air, constipation, and problems  digesting food.  Avoid eating too much and avoid swallowing air.  Avoid foods that cause gas, carbonated drinks, hard candy, and chewing gum. This information is not intended to replace advice given to you by your health care provider. Make sure you discuss any questions you have with your health care provider. Document Revised: 09/30/2018 Document Reviewed: 07/14/2016 Elsevier Patient Education  2020 [employer]. Fluoxetine capsules or tablets (Depression/Mood Disorders) ??? ???????????? ????? ??? ?????????? ?????????? ????????? ? ?????? ????????, ?????????? ??? ??????????? ?????????? ????????? ??????? ?????????? (?????). ?? ??????????? ??? ??????? ????????? ??????????, ????? ??? ?????????, ??????? ?????????? ????????? ? ????????? ??????. ?? ????? ????? ?????? ? ??????? ????????? ??????????? ???????? ?????????. ??? ????????? ????? ??????????? ? ?????? ?????; ???? ? ??? ????????? ???????, ?????????? ? ???????????? ????????? ??? ??????????. ???????????????? ????????? ???????? (????????): Prozac ??? ??? ??????? ?????????? ???????????? ????????? ?? ?????? ?????? ????? ?????????? ?????????? ?????, ???? ?? ? ??? ???? ?? ????????? ?????????:  ?????????? ???????????? ??? ?????? ??????????? ???????????? ? ?????????????;  ????????? ?????????????? ?????;  ????????;  ??????????? ??????;  ??????????? ??????;  ?????????? ?????? ?????? ? ?????;  ?????????? ???????? (??????????);  ????? ? ???????????? ??? ????? ??? ?????????????;  ??????? ????????????, ???????????? ???? ??? ?????? ????? ????? ? ???????;  ????? ??????????? ???????????????? (???), ????? ??? Carbex, Eldepryl, Marplan, Nardil ? Parnate;  ????? ?????????? ??? ??????? ? ?????????????? ??????????? ???????? ????????;  ??????????? ?????????? ??????;  ????????? ??? ????????????? ??????? ?? ??????????;  ????????? ??? ????????????? ??????? ?? ?????? ?????????;  ????????? ??? ????????????? ??????? ?? ??????? ????????, ????????? ???  ???????????;  ???????????? ??? ???????????? ????????????;  ????????? ??????; ??? ??? ??????? ????????? ??? ?????????? ?????????? ??? ????????? ??????, ??????? ???????? ????. ???????? ????????? ?? ???????? ??? ? ???????. ??????????? ????? ????? ????????? ??? ?? ????? ???, ??? ? ??? ???. ?????????? ??? ????????? ????? ?????? ?????????? ???????. ?? ?????????? ??? ????????? ????, ??? ???????. ?? ??????????? ????? ????? ????????? ????????, ???? ?????? ??? ?? ????????????? ??????. ?????? ??????????? ?????? ????? ????????? ????? ??????? ????????? ???????? ??????? ??? ????????? ?????????. ????????? ?????? ??? ?????????? ? ????????? (  MedGuide) ??? ?????? ??? ?????????? ??? ????????? ???????. ?????? ??? ??????? ??????????? ???????? ??? ??????????. ???????? ???????? ? ??????????? ?????????? ????? ????????? ?????. ???????? ?? ??, ??? ?? ???????????? ?????????? ??? ????????? ????? ????????? ????? ? 7 ???, ??????? ????????? ???? ????????????????. ?????????????: ???? ??? ???????, ??? ?? ????????? ?????????? ???? ????? ?????????, ?????????? ?????????? ? ????????????????? ????? ??? ???????? ????????? ??? ???????? ?????????? ??????. ??????????: ??? ????????? ????????????? ?????? ??? ???. ?? ???????? ?? ? ??????? ??????. ??? ?????????? ? ?????? ???????? ?????? ?????????? ???? ?? ?????? ??????? ?????????, ?? ?????????? ??????????? ???? ? ????? ??????????????? ??????? ????? ??????. ?? ?????????? ??????? ??? ?????????????? ????. ? ??? ??? ????????? ????? ???????? ?? ??????????????? ?? ?????????? ???????????? ? ?????-???? ?? ??????????????? ??????????:  ?????? ?????????, ?????????? ??????????, ????? ??? Sarafem ??? Symbyax;  ????????;  ??????????;  ?????????;  ???????????, ??????????? ???????????? ???, ?????? ??? Nardil, Parnate, Marplan, Eldepryl, Carbex;  ??????????? ????? (???????????? ????????);  ???????;  ??????????; ??? ????????? ????? ????? ????????????????? ?? ??????????  ???????????:  ????????;  ?????????;  ??????? ? ???????????????? ?????????;  ????????????;  ????????? ????????? ??? ??????? ?????????, ??????? ??? ????????????? ???????????;  ????????? ????????? ??? ??????? ???????, ????? ??? ???????????, ??????????, ????????????, ???????????, ???????????, ????????????;  ????????;  ?????????? ????????;  ????????;  ?????????;  ???????????;  ?????????;  ?????;  ??????????;  ????????? ?????????, ????????????? ??? ??????? ??? ?????????????? ????????, ????? ??? ????????, ??????????? ? ???????????;  ????, ????????? ??? ??????? ???? ? ??????????, ????? ??? ????????? ? ?????????;  ?????? ?????????, ?????????? ???????? QT (?????????? ????????? ?????????? ?????);  ????????;  ???????????;  ??????????;  ?????????;  ?????????;  ???????, ????? ??? ????????, ????? ?????????? (????-????), ?????????;  ????????;  ?????????;  ??????????; ???? ???????? ?? ???????? ???? ????????? ??????????????. ???????????? ???????????? ????????? ?????? ???? ??????????? ???? ????????, ????????????? ????????, ?????????????? ?????????? ? ??????? ???????. ????? ???????? ??? ? ???????, ???????????? ??????????? ???????? ??? ??????????. ????????? ???????? ????? ???????? ?? ?????????????? ? ??????????? ???? ??????????. ?? ??? ????? ???????? ???????? ??? ????????????? ????? ?????????? ?????????? ? ????? ??? ??????? ???????????? ?????????, ???? ????????? ?? ?????????? ??? ??????????. ????????? ????????? ????? ??? ??????? ???????????? ????????? ??? ?????????? ?? ????? ??????????. ????????? ????????, ??? ???????? ????? ????????? ? ?????? ???? ?? ??????? ??????? ?????? ????? ????????? ?????? ??? ??????, ????? ?????????? ??????? ???????? ???????????? ?????. ???????? ? ????? ?? ????? ?????? ???????? ???????? ?? ????????? ??? ????????? ?????? ? ???????????? ? ????????? ?????????. ????? ??????? ???????? ???????? ?? ????? ????????? ????????? ??? ?????????? ????????? ?????????  ?????? ? ????????, ????? ??? ?????, ???????????, ??????, ?????????????????, ????????????, ?????????????, ??????????????, ?????????? ?????????????, ?????????? ??????????? ? ???????????????, ? ????? ??????????. ? ???? ??????, ???????? ? ?????? ??????? ??? ????? ????????? ???? ?????????, ?????????? ? ????? ??? ??????? ???????????? ?????????. ???????? ?????????? ? ??????????????. ??????? ?? ?????????? ???????????, ?? ?????????????? ????????? ? ?? ?????????? ??????? ????????, ??????? ??????? ??????????? ???????? ? ??????? ???????, ?? ??? ???, ???? ?? ????????, ??? ?? ??? ?????? ??? ?????????. ?? ????????????? ???????? ? ???????? ??????, ???????? ????? ???????? ????????. ??? ????????????? ????????????? ?????????????? ??? ????????. ???????? ????? ??????? ??????? ?? ???????? ????? ?????????. ????????? ???????????? ??????????? ????????. ???????? ??????? ?? ???. ???????????? ??????????? ??????? ??? ??????, ???????????? ???????? ??? ???????? ????? ????? ??? ??????. ???? ??????? ?????????? ??? ?????????? ??? ????????????, ?????????? ? ?????. ??? ????????? ????? ?????? ?? ??????? ?????? ? ?????. ??? ???????? ??????? ?????????? ? ????? ??? ??????? ???????????? ?????????, ?????? ??? ???????? ????? ??? ???? ????????????????????? ?????????. ????? ???????? ??????? ????? ???? ??? ?????? ????? ?????????? ???????? ???????, ? ??????? ??? ??????? ??? ????? ?????? ???????? ????? ??? ??????? ???????????? ?????????:  ????????????? ???????, ????? ??? ?????? ????, ???, ??????????, ????????? ????, ??? ??? ?????;  ??????? ???????;  ??????, ????????????? ???;  ??????????? ???????;  ????????? ??????;  ??????????? ????????;  ??????????? ??????????, ????????? ??????????? ?? ???, ?????? ??????, ???????????? ?????????;  ???? ? ?????;  ?????????? ??? ???????;  ??????????????, ???????, ???????;  ??????? ???????????, ?????? ??? ??????????????;  ????????????, ?????? ????? ? ???????????;  ????????? ?????????? ???  ???????????;  ?????? ??????;  ??????????? ??? ??????????????? ???????;  ?????????????, ???????? ?? ???????, ????????????? ????????? ?????????????;  ?????????? ???????? (??????????);  ?????????? ????;  ????? ? ???????????? ??? ?????? ????????? ??????????;  ??????????;  ????????? ???????????? ??? ??????;  ????????? ???????? ??? ????????????;  ?????; ???????? ???????, ??????? ?????? ?? ??????? ???????????? ???????????? ???????? (???????? ????? ??? ??????? ???????????? ?????????, ???? ??? ???????????? ??? ?????????? ??????????):  ????????? ???????? ??? ????? ????;  ????????? ???????? ???????? ? ???????;  ??????;  ??????? ?? ???;  ???????? ????;  ????????? ??????????;  ???????;  ?????; ???? ???????? ?? ???????? ???? ????????? ???????? ????????. ?????????? ? ????? ?? ??????? ???????????? ???????? ????????. ?? ?????? ???????? ? ???????? ???????? ?  FDA ?? ???????? [phone]. ??? ??????? ??????? ??? ?????????? ??????? ? ??????????? ??? ????? ?????. ??????? ??? ????????? ??????????? 15-30 degrees? (59-86 ???????? ?? ????? ??????????). ??????? ??????????? ??? ???????????????? ????????? ????? ????????? ????? ????????, ?????????? ?? ???????? ??? ????????. ??????????: ???? ???????? ?? ???????? ???? ????????? ????????. ???? ? ??? ????????? ???????, ?????????? ??????? ?????????, ?????????? ? ?????, ?????????? ??? ??????? ???????????? ?????????.  2020 Elsevier/[employer] Standard (2018-04-22 00:00:00)

## 2019-12-25 NOTE — Progress Notes (Signed)
When she eats the bloating gets worse; she is uncomfortable Has missed work due to the bloating  She is having bowel movements- denies constipation  Would like Rx for zyrtec

## 2020-01-02 ENCOUNTER — Telehealth (INDEPENDENT_AMBULATORY_CARE_PROVIDER_SITE_OTHER): Payer: Self-pay | Admitting: Primary Care

## 2020-01-02 NOTE — Telephone Encounter (Signed)
Pt stated she was advised by the nurse  to call office back if symptoms has not gotten better to get an antibiotic / pt states her stomach still fill bloated but empty and wanted to see if that antibiotic can be called in / Pt also asked about a bacterial test / please advise   Pt has been using the stress/anxiety meds but still fills the same and has a lot of belching as well

## 2020-01-04 MED ORDER — CETIRIZINE HCL 10 MG PO TABS: 10.0000 mg | ORAL_TABLET | Freq: Every day | ORAL | 11 refills | Status: DC

## 2020-01-04 NOTE — Progress Notes (Signed)
Established Patient Office Visit  Subjective:  Patient ID: Nancy [name], female    DOB: 1981/02/01  Age: 39 y.o. MRN: [id number]  CC:  Chief Complaint  Patient presents with  . Bloated    since sunday     HPI Ms. Nancy [name] presents is a 39 year old female that is concerned with for when she eats she becomes bloated and it is getting worse and she is uncomfortable.  The pain has been so bad sh has missed work due to the bloating .  Allergies are also bothering her and requesting a refill for Zyrtec   Past Medical History:  Diagnosis Date  . Anemia   . Gallstones     Past Surgical History:  Procedure Laterality Date  . CESAREAN SECTION    . CHOLECYSTECTOMY N/A 10/13/2012   Procedure: LAPAROSCOPIC CHOLECYSTECTOMY WITH INTRAOPERATIVE CHOLANGIOGRAM;  Surgeon: Shelly Rubenstein, MD;  Location: [facility];  Service: General;  Laterality: N/A;    History reviewed. No pertinent family history.  Social History   Socioeconomic History  . Marital status: Married    Spouse name: Not on file  . Number of children: Not on file  . Years of education: Not on file  . Highest education level: Not on file  Occupational History  . Not on file  Tobacco Use  . Smoking status: Never Smoker  . Smokeless tobacco: Never Used  Vaping Use  . Vaping Use: Never used  Substance and Sexual Activity  . Alcohol use: No  . Drug use: No  . Sexual activity: Yes    Birth control/protection: None  Other Topics Concern  . Not on file  Social History Narrative  . Not on file   Social Determinants of Health   Financial Resource Strain:   . Difficulty of Paying Living Expenses:   Food Insecurity:   . Worried About Programme researcher, broadcasting/film/video in the Last Year:   . Barista in the Last Year:   Transportation Needs:   . Freight forwarder (Medical):   Marland Kitchen Lack of Transportation (Non-Medical):   Physical Activity:   . Days of Exercise per Week:   . Minutes of Exercise per Session:    Stress:   . Feeling of Stress :   Social Connections:   . Frequency of Communication with Friends and Family:   . Frequency of Social Gatherings with Friends and Family:   . Attends Religious Services:   . Active Member of Clubs or Organizations:   . Attends Banker Meetings:   Marland Kitchen Marital Status:   Intimate Partner Violence:   . Fear of Current or Ex-Partner:   . Emotionally Abused:   Marland Kitchen Physically Abused:   . Sexually Abused:     Outpatient Medications Prior to Visit  Medication Sig Dispense Refill  . etonogestrel-ethinyl estradiol (NUVARING) 0.12-0.015 MG/24HR vaginal ring INSERT VAGINALLY AND LEAVE IN PLACE FOR 3 CONSECUTIVE WEEKS, THEN REMOVE FOR 1 WEEK. 1 each 6  . omeprazole (PRILOSEC) 20 MG capsule Take 1 capsule (20 mg total) by mouth daily. 30 capsule 3  . cetirizine (ZYRTEC) 10 MG tablet Take 1 tablet (10 mg total) by mouth daily. (Patient not taking: Reported on 08/29/2018) 30 tablet 6  . fluconazole (DIFLUCAN) 150 MG tablet Take 1 pill daily for 5 days 5 tablet 0  . ketoconazole (NIZORAL) 2 % cream Apply 1 application topically daily. 60 g 1  . megestrol (MEGACE) 40 MG tablet TAKE 1 TABLET (  40 MG TOTAL) BY MOUTH DAILY. 10 tablet 0   No facility-administered medications prior to visit.    Allergies  Allergen Reactions  . Penicillins Swelling and Rash    Has patient had a PCN reaction causing immediate rash, facial/tongue/throat swelling, SO    ROS Review of Systems  Gastrointestinal: Positive for abdominal distention, abdominal pain and nausea.  All other systems reviewed and are negative.     Objective:    BP (!) 143/90 (BP Location: Right Arm, Patient Position: Sitting, Cuff Size: Large)   Pulse (!) 104   Temp 98 F (36.7 C) (Oral)   Ht 5\' 1"  (1.549 m)   Wt 259 lb (117.5 kg)   LMP 12/25/2019 (Exact Date) Comment: uses nuvaring  SpO2 95%   BMI 48.94 kg/m  Wt Readings from Last 3 Encounters:  12/25/19 259 lb (117.5 kg)  08/29/18 259 lb 9.6  oz (117.8 kg)  03/07/18 253 lb 12.8 oz (115.1 kg)   General: Vital signs reviewed.  Patient is well-developed  Morbid obese Body mass index is 48.94 kg/m.and well-nourished, in no acute distress and cooperative with exam.  Head: Normocephalic and atraumatic. Eyes: EOMI, conjunctivae normal, no scleral icterus.  Neck: Supple, trachea midline, normal ROM, no JVD, masses, thyromegaly, or carotid bruit present.  Cardiovascular: RRR, S1 normal, S2 normal, no murmurs, gallops, or rubs. Pulmonary/Chest: Clear to auscultation bilaterally, no wheezes, rales, or rhonchi. Abdominal: Soft, tender,distended, BS +, no masses, organomegaly, or guarding present. Grimacing  Musculoskeletal: No joint deformities, erythema, or stiffness, ROM full and nontender. Extremities: No lower extremity edema bilaterally,  pulses symmetric and intact bilaterally. No cyanosis or clubbing. Neurological: A&O x3, Strength is normal and symmetric bilaterally, cranial nerve II-XII are grossly intact, no focal motor deficit, sensory intact to light touch bilaterally.  Skin: Warm, dry and intact. No rashes or erythema. Psychiatric: Normal mood and affect. speech and behavior is normal. Cognition and memory are normal.   Health Maintenance Due  Topic Date Due  . Hepatitis C Screening  Never done  . COVID-19 Vaccine (1) Never done  . PAP SMEAR-Modifier  06/17/2017    There are no preventive care reminders to display for this patient.  Lab Results  Component Value Date   TSH 1.810 02/01/2017   Lab Results  Component Value Date   WBC 12.0 (H) 07/19/2017   HGB 11.3 07/19/2017   HCT 36.4 07/19/2017   MCV 83 07/19/2017   PLT 439 (H) 07/19/2017   Lab Results  Component Value Date   NA 138 02/01/2017   K 4.3 02/01/2017   CO2 23 02/01/2017   GLUCOSE 85 02/01/2017   BUN 10 02/01/2017   CREATININE 0.57 02/01/2017   BILITOT 0.5 02/01/2017   ALKPHOS 98 02/01/2017   AST 19 02/01/2017   ALT 14 02/01/2017   PROT 7.6  02/01/2017   ALBUMIN 4.3 02/01/2017   CALCIUM 8.9 02/01/2017   Lab Results  Component Value Date   CHOL 156 06/14/2015   Lab Results  Component Value Date   HDL 17 (L) 06/14/2015   Lab Results  Component Value Date   LDLCALC 102 06/14/2015   Lab Results  Component Value Date   TRIG 185 (H) 06/14/2015   Lab Results  Component Value Date   CHOLHDL 9.2 (H) 06/14/2015   Lab Results  Component Value Date   HGBA1C 5.5 02/01/2017      Assessment & Plan:  Jawanda was seen today for bloated.  Diagnoses and all orders for this  visit:  Adjustment disorder with mixed anxiety and depressed mood We discussed options for treatment of anxiety including therapy and/or medication. Decided to do both start on SSRI and f/u with CSW  -     FLUoxetine (PROZAC) 10 MG tablet; Take 2 tablets (20 mg total) by mouth daily.  Gastroesophageal reflux disease with esophagitis without hemorrhage Discussed eating small frequent meal, reduction in acidic foods, fried foods ,spicy foods, alcohol caffeine and tobacco and certain medications. Avoid laying down after eating 57mins-1hour, elevated head of the bed. Continue omeprazole as needed  Seasonal allergic rhinitis due to pollen -     cetirizine (ZYRTEC) 10 MG tablet; Take 1 tablet (10 mg total) by mouth daily.  Other orders -     omeprazole (PRILOSEC) 20 MG capsule; Take 1 capsule (20 mg total) by mouth daily.    Follow-up: Return in about 6 weeks (around 02/05/2020) for 2 appt on the same day with CSW and PCP for anxiety and depression.Grayce Sessions, NP

## 2020-01-05 ENCOUNTER — Ambulatory Visit (INDEPENDENT_AMBULATORY_CARE_PROVIDER_SITE_OTHER): Payer: Self-pay | Admitting: Primary Care

## 2020-02-10 ENCOUNTER — Telehealth (INDEPENDENT_AMBULATORY_CARE_PROVIDER_SITE_OTHER): Payer: Self-pay | Admitting: Licensed Clinical Social Worker

## 2020-02-10 ENCOUNTER — Ambulatory Visit (INDEPENDENT_AMBULATORY_CARE_PROVIDER_SITE_OTHER): Payer: Self-pay | Admitting: Primary Care

## 2020-02-10 ENCOUNTER — Institutional Professional Consult (permissible substitution) (INDEPENDENT_AMBULATORY_CARE_PROVIDER_SITE_OTHER): Payer: Self-pay | Admitting: Licensed Clinical Social Worker

## 2020-02-10 NOTE — Telephone Encounter (Signed)
Call placed to patient utilizing [employer] [phone] [facility]) LCSW left a message requesting a return call.

## 2020-04-14 ENCOUNTER — Ambulatory Visit (INDEPENDENT_AMBULATORY_CARE_PROVIDER_SITE_OTHER): Payer: Self-pay | Admitting: *Deleted

## 2020-04-14 ENCOUNTER — Ambulatory Visit (INDEPENDENT_AMBULATORY_CARE_PROVIDER_SITE_OTHER): Payer: Self-pay | Admitting: Primary Care

## 2020-04-14 NOTE — Telephone Encounter (Signed)
Patient is calling with vertigo symptoms- patient states this happened 2 years ago and was caused by a sinus /ear infection.Patient states she feels ear pressure and sinus pressure and that it is causing her vertigo symptoms. Call to office- no appointment showing on schedule-patient has been scheduled.  Reason for Disposition . [1] MODERATE dizziness (e.g., vertigo; feels very unsteady, interferes with normal activities) AND [2] has NOT been evaluated by physician for this  Answer Assessment - Initial Assessment Questions 1. DESCRIPTION: "Describe your dizziness."     Sinus infection causes dizziness 2. VERTIGO: "Do you feel like either you or the room is spinning or tilting?"      yes 3. LIGHTHEADED: "Do you feel lightheaded?" (e.g., somewhat faint, woozy, weak upon standing)     yes 4. SEVERITY: "How bad is it?"  "Can you walk?"   - MILD: Feels unsteady but walking normally.   - MODERATE: Feels very unsteady when walking, but not falling; interferes with normal activities (e.g., school, work) .   - SEVERE: Unable to walk without falling, or requires assistance to walk without falling.     Moderate/severe 5. ONSET:  "When did the dizziness begin?"     Monday 6. AGGRAVATING FACTORS: "Does anything make it worse?" (e.g., standing, change in head position)     Standing- laying down 7. CAUSE: "What do you think is causing the dizziness?"     Vertigo- sinus infection causing 8. RECURRENT SYMPTOM: "Have you had dizziness before?" If Yes, ask: "When was the last time?" "What happened that time?"     Yes- diagnosed with vertigo 2 years ago- patient was examined and given antibiotics and other medications for mucus and dizziness 9. OTHER SYMPTOMS: "Do you have any other symptoms?" (e.g., headache, weakness, numbness, vomiting, earache)     Sinus symtoms 10. PREGNANCY: "Is there any chance you are pregnant?" "When was your last menstrual period?"       No- LMP- 1 month ago  Protocols used:  DIZZINESS - VERTIGO-A-AH

## 2020-08-24 ENCOUNTER — Telehealth (INDEPENDENT_AMBULATORY_CARE_PROVIDER_SITE_OTHER): Payer: Self-pay | Admitting: Primary Care

## 2020-08-24 NOTE — Telephone Encounter (Signed)
Medication Refill - Medication: fluconazole (DIFLUCAN) 150 MG tablet Pt states she has a yeast infection and her provider prescribed this to her before / please advise   Has the patient contacted their pharmacy? No. (Agent: If no, request that the patient contact the pharmacy for the refill.) (Agent: If yes, when and what did the pharmacy advise?)  Preferred Pharmacy (with phone number or street name):  [id number] - [place], [place] - [address] Phone:  [phone]  Fax:  [phone]       Agent: Please be advised that RX refills may take up to 3 business days. We ask that you follow-up with your pharmacy.

## 2020-08-25 NOTE — Telephone Encounter (Signed)
Called patient and LVM to return call and schedule and appointment. Provider will not be able to prescribe medication without being seen.

## 2020-08-27 ENCOUNTER — Ambulatory Visit (INDEPENDENT_AMBULATORY_CARE_PROVIDER_SITE_OTHER): Payer: Self-pay | Admitting: *Deleted

## 2020-08-27 NOTE — Telephone Encounter (Signed)
Patient made additional call and was notified that an appt was needed The earliest available appt at the time of call with agent was for 09/07/20 Patient declined appt and shares that they are uncomfortable waiting that long Patient has inquired if it is possible to be seen sooner virtually or by phone Please contact to advise

## 2020-08-27 NOTE — Telephone Encounter (Signed)
Patient c/o yeast infection. Symptoms are vaginal itching, pain in vaginal area from irritation, yellow discharge, burning during urination due to irritation. Denies fever, abdominal pain. Patient reports she has had yeast infections in the past and knows symptoms are the same. Requesting any medication to help symptoms and appt scheduled for 08/30/20. Patient asking if she can get prescription for over the weekend and she will still come to appt. Care advise given. Patient verbalized understanding of care advise and to call back or go speak with pharmacist for OTC medications  if symptoms worsen.   Reason for Disposition . Symptoms of a vaginal yeast infection (i.e., white, thick, cottage-cheese-like, itchy, not bad smelling discharge)  Answer Assessment - Initial Assessment Questions 1. DISCHARGE: "Describe the discharge." (e.g., white, yellow, green, gray, foamy, cottage cheese-like)     yellow 2. ODOR: "Is there a bad odor?"     Yes odor but not too bad 3. ONSET: "When did the discharge begin?"     na 4. RASH: "Is there a rash in that area?" If Yes, ask: "Describe it." (e.g., redness, blisters, sores, bumps)     No  5. ABDOMINAL PAIN: "Are you having any abdominal pain?" If Yes, ask: "What does it feel like? " (e.g., crampy, dull, intermittent, constant)      No  6. ABDOMINAL PAIN SEVERITY: If present, ask: "How bad is it?"  (e.g., mild, moderate, severe)  - MILD - doesn't interfere with normal activities   - MODERATE - interferes with normal activities or awakens from sleep   - SEVERE - patient doesn't want to move (R/O peritonitis)      no 7. CAUSE: "What do you think is causing the discharge?" "Have you had the same problem before? What happened then?"     Yeast infection, has had before. 8. OTHER SYMPTOMS: "Do you have any other symptoms?" (e.g., fever, itching, vaginal bleeding, pain with urination, injury to genital area, vaginal foreign body)     Vaginal itching, odor, pain with  urination due to irritation, yellow discharge 9. PREGNANCY: "Is there any chance you are pregnant?" "When was your last menstrual period?"     na  Protocols used: VAGINAL DISCHARGE-A-AH

## 2020-08-27 NOTE — Telephone Encounter (Signed)
Called patient and LVM to return call to try to schedule a sooner appt.

## 2020-08-30 ENCOUNTER — Ambulatory Visit (INDEPENDENT_AMBULATORY_CARE_PROVIDER_SITE_OTHER): Payer: Self-pay | Admitting: Primary Care

## 2020-08-30 NOTE — Telephone Encounter (Signed)
Attempted to reach patient to notify, call went straight to VM and LVM advising patient that her concerns would be addressed at her appointment today.

## 2020-08-30 NOTE — Telephone Encounter (Signed)
Patient has an appt today with PCP. This will be discussed at that time. Please inform patient.

## 2020-10-28 ENCOUNTER — Ambulatory Visit (INDEPENDENT_AMBULATORY_CARE_PROVIDER_SITE_OTHER): Payer: Self-pay | Admitting: *Deleted

## 2020-10-28 ENCOUNTER — Other Ambulatory Visit (INDEPENDENT_AMBULATORY_CARE_PROVIDER_SITE_OTHER): Payer: Self-pay | Admitting: Primary Care

## 2020-10-28 ENCOUNTER — Other Ambulatory Visit: Payer: Self-pay

## 2020-10-28 NOTE — Telephone Encounter (Signed)
Requested medication (s) are due for refill today: Yes  Requested medication (s) are on the active medication list: No  Last refill:  1 year ago  Future visit scheduled: Yes  Notes to clinic:  Unable to refill per protocol, Rx expired, see notes from patient.      Requested Prescriptions  Pending Prescriptions Disp Refills   etonogestrel-ethinyl estradiol (NUVARING) 0.12-0.015 MG/24HR vaginal ring 1 each 6    Sig: INSERT VAGINALLY AND LEAVE IN PLACE FOR 3 CONSECUTIVE WEEKS, THEN REMOVE FOR 1 WEEK.      OB/GYN:  Contraceptives Failed - 10/28/2020  5:26 PM      Failed - Last BP in normal range    BP Readings from Last 1 Encounters:  12/25/19 (!) 143/90          Passed - Valid encounter within last 12 months    Recent Outpatient Visits           10 months ago Adjustment disorder with mixed anxiety and depressed mood   [facility] Grayce Sessions, NP   1 year ago Candidal intertrigo   [facility] Grayce Sessions, NP   2 years ago Menorrhagia with regular cycle   [facility] Grayce Sessions, NP   2 years ago Candidal intertrigo   [facility] Loletta Specter, PA-C   2 years ago Acute bilateral low back pain without sciatica   [facility] Loletta Specter, PA-C       Future Appointments             In 2 weeks Grayce Sessions, NP [facility]

## 2020-10-28 NOTE — Telephone Encounter (Signed)
Copied from CRM [id number]. Topic: Quick Communication - Rx Refill/Question >> Oct 28, 2020  4:35 PM Mcneil, Ja-Kwan wrote: Medication: megestrol (MEGACE) 40 MG tablet  Has the patient contacted their pharmacy? yes - Pt told to contact provider  Preferred Pharmacy (with phone number or street name): [id number] - [place], [place] - [address] Phone: [phone]   Fax: [phone]  Agent: Please be advised that RX refills may take up to 3 business days. We ask that you follow-up with your pharmacy.

## 2020-10-28 NOTE — Telephone Encounter (Signed)
Patient called via Ern[facility] Rutherford ID # [phone].Pt stated she is feeling very weak due to a heavy period. Pt stated this is an issue for her and she needs to speak with a nurse. Cb# [phone]  patient calling to request refills on NUVARING and or megastrol medications. Patient reports she has heavy menstrual bleeding since Saturday. Had to leave work today due to bleeding and dizziness and lightheadedness. Patient was not very specific with answering questions due to wanting prescription refills. Patient reports last time heavy bleeding she was hospitalized and does not want that to happen again. Denies passing out or difficulty walking. C/o dizziness and feeling lightheaded. megastrol not on medication list. Please advise. Care advise given. Patient verbalized understanding of care advise and to go to [facility] or ED if symptoms worsen.   Patient requesting megastrol to be sent to [facility] [address] [place] [facility].   Reason for Disposition . Using vaginal contraceptive ring (i.e., NuvaRing)  Answer Assessment - Initial Assessment Questions 1. AMOUNT: "Describe the bleeding that you are having."    - SPOTTING: spotting, or pinkish / brownish mucous discharge; does not fill panti-liner or pad    - MILD:  less than 1 pad / hour; less than patient's usual menstrual bleeding   - MODERATE: 1-2 pads / hour; 1 menstrual cup every 6 hours; small-medium blood clots (e.g., pea, grape, small coin)   - SEVERE: soaking 2 or more pads/hour for 2 or more hours; 1 menstrual cup every 2 hours; bleeding not contained by pads or continuous red blood from vagina; large blood clots (e.g., golf ball, large coin)      Patient did not give specific information but reports she had to leave work early due to heavy bleeding  2. ONSET: "When did the bleeding begin?" "Is it continuing now?"     Saturday  3. MENSTRUAL PERIOD: "When was the last normal menstrual period?" "How is this different than your period?"     Same  4.  REGULARITY: "How regular are your periods?"     Regular and heavy  5. ABDOMINAL PAIN: "Do you have any pain?" "How bad is the pain?"  (e.g., Scale 1-10; mild, moderate, or severe)   - MILD (1-3): doesn't interfere with normal activities, abdomen soft and not tender to touch    - MODERATE (4-7): interferes with normal activities or awakens from sleep, tender to touch    - SEVERE (8-10): excruciating pain, doubled over, unable to do any normal activities      na 6. PREGNANCY: "Could you be pregnant?" "Are you sexually active?" "Did you recently give birth?"     na 7. BREASTFEEDING: "Are you breastfeeding?"     na 8. HORMONES: "Are you taking any hormone medications, prescription or OTC?" (e.g., birth control pills, estrogen)     Megestrol and NUVARGING 9. BLOOD THINNERS: "Do you take any blood thinners?" (e.g., Coumadin/warfarin, Pradaxa/dabigatran, aspirin)     na 10. CAUSE: "What do you think is causing the bleeding?" (e.g., recent gyn surgery, recent gyn procedure; known bleeding disorder, cervical cancer, polycystic ovarian disease, fibroids)         Hx heavy bleeding during periods 11. HEMODYNAMIC STATUS: "Are you weak or feeling lightheaded?" If Yes, ask: "Can you stand and walk normally?"        Dizzy and lightheaded. Can walk normal 12. OTHER SYMPTOMS: "What other symptoms are you having with the bleeding?" (e.g., passed tissue, vaginal discharge, fever, menstrual-type cramps)       Heavy  vaginal  bleeding  Protocols used: VAGINAL BLEEDING - ABNORMAL-A-AH

## 2020-10-28 NOTE — Telephone Encounter (Addendum)
Patient is requesting a refill on megastrol (Megace) due to heavy periods. She asks if Marcelino Duster would refill tomorrow or she would have to go to the ED. I advised the mobile unit is in [place] today until 6:30p and on Monday, address provided. She says she will wait on Marcelino Duster and if she doesn't get the Rx, she will go to the mobile bus on Monday morning. See Nurse Triage encounter.

## 2020-10-29 ENCOUNTER — Telehealth (INDEPENDENT_AMBULATORY_CARE_PROVIDER_SITE_OTHER): Payer: Self-pay | Admitting: Primary Care

## 2020-10-29 ENCOUNTER — Other Ambulatory Visit: Payer: Self-pay

## 2020-10-29 NOTE — Telephone Encounter (Signed)
See previous encounter. Patient called in regarding the VM I left. Patient upset that no medication can be sent to stop the bleeding prior to her upcoming appointment on the 19th. I tried to explain to patient that she hasn't been seen in a year and that [place] cannot send in any medications. I offered for her to be seen at urgent care or wait until Monday and she could go be seen at mobile.   Patient stated there is no point of having Marcelino Duster as her PCP is she cannot help her. I explained to her that again, it was because she hadn't been seen in a year. Patient was upset, I attempted to schedule sooner than her current appointment or offer her mobile. When offering these options, patient hung up on me.

## 2020-10-29 NOTE — Telephone Encounter (Signed)
Call placed to patient and LVM using interpreter services advising I was calling from [facility] in regards to an earlier request I had received. Advised patient to call [phone] in regards to the request.   If patient calls back please advise her that she needs to be seen in order to receive refills since she has not been seen since July of last year.

## 2020-10-29 NOTE — Telephone Encounter (Signed)
Patient last seen 12/25/2019. Please advise if appropriate.

## 2020-11-01 ENCOUNTER — Other Ambulatory Visit: Payer: Self-pay

## 2020-11-01 ENCOUNTER — Ambulatory Visit (INDEPENDENT_AMBULATORY_CARE_PROVIDER_SITE_OTHER): Payer: Self-pay | Admitting: Primary Care

## 2020-11-01 ENCOUNTER — Other Ambulatory Visit (HOSPITAL_COMMUNITY)
Admission: RE | Admit: 2020-11-01 | Discharge: 2020-11-01 | Disposition: A | Discharge status: Home / Self Care | Payer: Self-pay | Source: Ambulatory Visit | Attending: Physician Assistant | Admitting: Physician Assistant

## 2020-11-01 ENCOUNTER — Ambulatory Visit: Payer: Self-pay | Admitting: Physician Assistant

## 2020-11-01 VITALS — BP 138/81 | HR 105 | Temp 98.2°F | Resp 18 | Ht 61.0 in | Wt 258.0 lb

## 2020-11-01 DIAGNOSIS — D649 Anemia, unspecified: Secondary | ICD-10-CM

## 2020-11-01 DIAGNOSIS — N898 Other specified noninflammatory disorders of vagina: Secondary | ICD-10-CM

## 2020-11-01 DIAGNOSIS — N920 Excessive and frequent menstruation with regular cycle: Secondary | ICD-10-CM

## 2020-11-01 MED ORDER — MEGESTROL ACETATE 40 MG PO TABS: 40.0000 mg | ORAL_TABLET | Freq: Every day | ORAL | 0 refills | Status: DC

## 2020-11-01 MED ORDER — FLUCONAZOLE 150 MG PO TABS: 150.0000 mg | ORAL_TABLET | Freq: Once | ORAL | 0 refills | Status: AC

## 2020-11-01 NOTE — Patient Instructions (Addendum)
You will use Megace to help with your heavy bleeding.  Make sure to follow-up with your primary care provider for your Pap exam.  You will use Diflucan to help you with your vaginal itching.  We will call you with your lab results.  Please let us know if there is anything else we can do.  Roney Jaffe, PA-C Physician Assistant [facility] [email or web address]    Menorrhagia Menorrhagia is a form of abnormal uterine bleeding in which menstrual periods are heavy or last longer than normal. With menorrhagia, the periods may cause enough blood loss and cramping that a woman becomes unable to take part in her usual activities. What are the causes? Common causes of this condition include:  Polyps or fibroids. These are noncancerous growths in the uterus.  An imbalance of the hormones estrogen and progesterone.  Anovulation, which occurs when one of the ovaries does not release an egg during one or more months.  A problem with the thyroid gland (hypothyroidism).  Side effects of having an intrauterine device (IUD).  Side effects of some medicines, such as NSAIDs or blood thinners.  A bleeding disorder that stops the blood from clotting normally. In some cases, the cause of this condition is not known. What increases the risk? You are more likely to develop this condition if you have cancer of the uterus. What are the signs or symptoms? Symptoms of this condition include:  Routinely having to change your pad or tampon every 1-2 hours because it is soaked.  Needing to use pads and tampons at the same time because of heavy bleeding.  Needing to wake up to change your pads or tampons during the night.  Passing blood clots larger than 1 inch (2.5 cm) in size.  Having bleeding that lasts for more than 7 days.  Having symptoms of low iron levels (anemia), such as tiredness (fatigue) or shortness of breath. How is this  diagnosed? This condition may be diagnosed based on:  A physical exam.  Your symptoms and menstrual history.  Tests, such as: ? Blood tests to check if you are pregnant or if you have hormonal changes, a bleeding or thyroid disorder, anemia, or other problems. ? Pap test to check for cancerous changes, infections, or inflammation. ? Endometrial biopsy. This test involves removing a tissue sample from the lining of the uterus (endometrium) to be examined under a microscope. ? Pelvic ultrasound. This test uses sound waves to create images of your uterus, ovaries, and vagina. The images can show if you have fibroids or other growths. ? Hysteroscopy. For this test, a thin, flexible tube with a light on the end (hysteroscope) is used to look inside your uterus. How is this treated? Treatment may not be needed for this condition. If it is needed, the best treatment for you will depend on:  Whether you need to prevent pregnancy.  Your desire to have children in the future.  The cause and severity of your bleeding.  Your personal preference. Medicine Medicines are the first step in treatment. You may be treated with:  Hormonal birth control methods. These treatments reduce bleeding during your menstrual period. They include: ? Birth control pills. ? Skin patch. ? Vaginal ring. ? Shots (injections) that you get every 3 months. ? Hormonal IUD. ? Implants that go under the skin.  Medicines that thicken the blood and slow bleeding.  Medicines that reduce swelling, such as ibuprofen.  Medicines that contain an artificial (synthetic) hormone called progestin.  Medicines that make the ovaries stop working for a short time.  Iron supplements to treat anemia.   Surgery If medicines do not work, surgery may be done. Surgical options may include:  Dilation and curettage (D&C). In this procedure, your health care provider opens the lowest part of the uterus (cervix) and then scrapes or  suctions tissue from the endometrium. This reduces menstrual bleeding.  Operative hysteroscopy. In this procedure, a hysteroscope is used to view your uterus and help remove polyps that may be causing heavy periods.  Endometrial ablation. This is when various techniques are used to permanently destroy your entire endometrium. After endometrial ablation, most women have little or no menstrual flow. This procedure reduces your ability to become pregnant.  Endometrial resection. In this procedure, an electrosurgical wire loop is used to remove the endometrium. This procedure reduces your ability to become pregnant.  Hysterectomy. This is surgical removal of your uterus. This is a permanent procedure that stops menstrual periods. Pregnancy is not possible after a hysterectomy. Follow these instructions at home: Medicines  Take over-the-counter and prescription medicines only as told by your health care provider. This includes iron pills.  Do not change or switch medicines without asking your health care provider.  Do not take aspirin or medicines that contain aspirin 1 week before or during your menstrual period. Aspirin may make bleeding worse. Managing constipation Your iron pills may cause constipation. If you are taking prescription iron supplements, you may need to take these actions to prevent or treat constipation:  Drink enough fluid to keep your urine pale yellow.  Take over-the-counter or prescription medicines.  Eat foods that are high in fiber, such as beans, whole grains, and fresh fruits and vegetables.  Limit foods that are high in fat and processed sugars, such as fried or sweet foods. General instructions  If you need to change your sanitary pad or tampon more than once every 2 hours, limit your activity until the bleeding stops.  Eat well-balanced meals, including foods that are high in iron. Foods that have a lot of iron include leafy green vegetables, meat, liver, eggs,  and whole-grain breads and cereals.  Do not try to lose weight until the abnormal bleeding has stopped and your blood iron level is back to normal. If you need to lose weight, work with your health care provider to lose weight safely.  Keep all follow-up visits. This is important. Contact a health care provider if:  You soak through a pad or tampon every 1 or 2 hours, and this happens every time you have a period.  You need to use pads and tampons at the same time because you are bleeding so much.  You have nausea, vomiting, diarrhea, or other problems related to medicines you are taking. Get help right away if:  You soak through more than a pad or tampon in 1 hour.  You pass clots bigger than 1 inch (2.5 cm) wide.  You feel short of breath.  You feel like your heart is beating too fast.  You feel dizzy or you faint.  You feel very weak or tired. Summary  Menorrhagia is a form of abnormal uterine bleeding in which menstrual periods are heavy or last longer than normal.  Treatment may not be needed for this condition. If it is needed, it may include medicines or procedures.  Take over-the-counter and prescription medicines only as told by your health care provider. This includes iron pills.  Get help right away if  you have heavy bleeding that soaks through more than a pad or tampon in 1 hour, you pass large clots, or you feel dizzy, short of breath, or very weak or tired. This information is not intended to replace advice given to you by your health care provider. Make sure you discuss any questions you have with your health care provider. Document Revised: 02/24/2020 Document Reviewed: 02/24/2020 Elsevier Patient Education  2021 [employer].

## 2020-11-01 NOTE — Progress Notes (Signed)
Patient reports itching beginning 3 weeks ago. Patient began bleeding Saturday.

## 2020-11-01 NOTE — Progress Notes (Signed)
Established Patient Office Visit  Subjective:  Patient ID: Nancy [name], female    DOB: [birth year]  Age: 40 y.o. MRN: [id number]  CC:  Chief Complaint  Patient presents with  . Anemia    HPI [name] reports that she started having a menses approximately 10 days ago, states that she is using overnight pads, states that she is going through at least 4-5 a day.  Reports cramping and clotting.  Reports significant history of anemia due to menorrhagia with cycle.  Has previously used Megace with relief.  Reports that she has been prescribed NuvaRing, states that she did stop using that 4 months ago on her own.  Does endorse some fatigue, states that she did recently restart her over-the-counter iron, states that she does not take this all the time due to GI upset.  Reports that she has also been having vaginal itching for the past 3 weeks, states that she does have a history of yeast infections.  States she is unsure if she has been having any vaginal discharge due to her heavy menses.  Denies dysuria, low back pain, does endorse some suprapubic discomfort, however relates it to menses cramping.  Reports that she has a Pap scheduled with her PCP on May 19 to follow-up with discussion on continuing NuvaRing.  Reports that she discontinued the Prozac on her own, states that she started it due to extreme grief and states that she does feel that she is coping well with her losses.   Past Medical History:  Diagnosis Date  . Anemia   . Gallstones     Past Surgical History:  Procedure Laterality Date  . CESAREAN SECTION    . CHOLECYSTECTOMY N/A 10/13/2012   Procedure: LAPAROSCOPIC CHOLECYSTECTOMY WITH INTRAOPERATIVE CHOLANGIOGRAM;  Surgeon: Harl Bowie, MD;  Location: [facility];  Service: General;  Laterality: N/A;    History reviewed. No pertinent family history.  Social History   Socioeconomic History  . Marital status: Married    Spouse name: Not on file  . Number of  children: Not on file  . Years of education: Not on file  . Highest education level: Not on file  Occupational History  . Not on file  Tobacco Use  . Smoking status: Never Smoker  . Smokeless tobacco: Never Used  Vaping Use  . Vaping Use: Never used  Substance and Sexual Activity  . Alcohol use: No  . Drug use: No  . Sexual activity: Yes    Birth control/protection: None  Other Topics Concern  . Not on file  Social History Narrative  . Not on file   Social Determinants of Health   Financial Resource Strain: Not on file  Food Insecurity: Not on file  Transportation Needs: Not on file  Physical Activity: Not on file  Stress: Not on file  Social Connections: Not on file  Intimate Partner Violence: Not on file    Outpatient Medications Prior to Visit  Medication Sig Dispense Refill  . cetirizine (ZYRTEC) 10 MG tablet Take 1 tablet (10 mg total) by mouth daily. 30 tablet 11  . etonogestrel-ethinyl estradiol (NUVARING) 0.12-0.015 MG/24HR vaginal ring INSERT VAGINALLY AND LEAVE IN PLACE FOR 3 CONSECUTIVE WEEKS, THEN REMOVE FOR 1 WEEK. 1 each 6  . FLUoxetine (PROZAC) 10 MG tablet Take 2 tablets (20 mg total) by mouth daily. 90 tablet 0  . megestrol (MEGACE) 40 MG tablet Take 40 mg by mouth daily.    Marland Kitchen omeprazole (PRILOSEC) 20 MG capsule  Take 1 capsule (20 mg total) by mouth daily. 90 capsule 1   No facility-administered medications prior to visit.    Allergy to penicillins  ROS Review of Systems  Constitutional: Positive for fatigue. Negative for chills and fever.  HENT: Negative.   Eyes: Negative.   Respiratory: Negative for shortness of breath.   Cardiovascular: Negative for chest pain.  Gastrointestinal: Negative for diarrhea, nausea and vomiting.  Endocrine: Negative.   Genitourinary: Positive for menstrual problem and vaginal bleeding. Negative for dysuria and vaginal discharge.  Musculoskeletal: Negative for back pain.  Skin: Negative.   Allergic/Immunologic:  Negative.   Neurological: Negative for dizziness, weakness and headaches.  Hematological: Negative.   Psychiatric/Behavioral: Negative.       Objective:    Physical Exam Vitals and nursing note reviewed.  Constitutional:      General: She is not in acute distress.    Appearance: Normal appearance. She is not ill-appearing.  HENT:     Head: Normocephalic and atraumatic.     Right Ear: External ear normal.     Left Ear: External ear normal.     Nose: Nose normal.     Mouth/Throat:     Mouth: Mucous membranes are moist.     Pharynx: Oropharynx is clear.  Eyes:     Extraocular Movements: Extraocular movements intact.     Conjunctiva/sclera: Conjunctivae normal.     Pupils: Pupils are equal, round, and reactive to light.  Cardiovascular:     Rate and Rhythm: Normal rate and regular rhythm.     Pulses: Normal pulses.     Heart sounds: Normal heart sounds.  Pulmonary:     Effort: Pulmonary effort is normal.     Breath sounds: Normal breath sounds.  Abdominal:     Tenderness: There is no abdominal tenderness. There is no right CVA tenderness or left CVA tenderness.  Musculoskeletal:        General: Normal range of motion.     Cervical back: Normal range of motion and neck supple.  Skin:    General: Skin is warm and dry.  Neurological:     General: No focal deficit present.     Mental Status: She is alert and oriented to person, place, and time.  Psychiatric:        Mood and Affect: Mood normal.        Behavior: Behavior normal.        Thought Content: Thought content normal.        Judgment: Judgment normal.     BP 138/81 (BP Location: Left Arm, Patient Position: Sitting, Cuff Size: Large)   Pulse (!) 105   Temp 98.2 F (36.8 C) (Oral)   Resp 18   Ht '5\' 1"'  (1.549 m)   Wt 258 lb (117 kg)   LMP 10/29/2020   SpO2 99%   BMI 48.75 kg/m  Wt Readings from Last 3 Encounters:  11/01/20 258 lb (117 kg)  12/25/19 259 lb (117.5 kg)  08/29/18 259 lb 9.6 oz (117.8 kg)      Health Maintenance Due  Topic Date Due  . COVID-19 Vaccine (1) Never done  . Hepatitis C Screening  Never done  . PAP SMEAR-Modifier  06/17/2017    There are no preventive care reminders to display for this patient.  Lab Results  Component Value Date   TSH 1.160 11/01/2020   Lab Results  Component Value Date   WBC 15.3 (H) 11/01/2020   HGB 9.9 (L) 11/01/2020  HCT 32.7 (L) 11/01/2020   MCV 70 (L) 11/01/2020   PLT 457 (H) 11/01/2020   Lab Results  Component Value Date   NA 132 (L) 11/01/2020   K 4.2 11/01/2020   CO2 23 02/01/2017   GLUCOSE 384 (H) 11/01/2020   BUN 10 11/01/2020   CREATININE 0.58 11/01/2020   BILITOT 0.4 11/01/2020   ALKPHOS 181 (H) 11/01/2020   AST 59 (H) 11/01/2020   ALT 14 02/01/2017   PROT 8.3 11/01/2020   ALBUMIN 4.0 11/01/2020   CALCIUM 9.1 11/01/2020   EGFR 117 11/01/2020   Lab Results  Component Value Date   CHOL 156 06/14/2015   Lab Results  Component Value Date   HDL 17 (L) 06/14/2015   Lab Results  Component Value Date   LDLCALC 102 06/14/2015   Lab Results  Component Value Date   TRIG 185 (H) 06/14/2015   Lab Results  Component Value Date   CHOLHDL 9.2 (H) 06/14/2015   Lab Results  Component Value Date   HGBA1C 5.5 02/01/2017      Assessment & Plan:   Problem List Items Addressed This Visit      Other   Anemia   Relevant Orders   CBC with Differential/Platelet (Completed)   Iron, TIBC and Ferritin Panel (Completed)   Menorrhagia with regular cycle   Relevant Medications   megestrol (MEGACE) 40 MG tablet   Other Relevant Orders   Comp. Metabolic Panel (12) (Completed)   TSH (Completed)    Other Visit Diagnoses    Vaginal itching    -  Primary   Relevant Orders   Cervicovaginal ancillary only    1. Vaginal itching Trial Diflucan, patient encouraged to increase hydration, vaginal swab pending - Cervicovaginal ancillary only - fluconazole (DIFLUCAN) 150 MG tablet; Take 1 tablet (150 mg total) by  mouth once for 1 dose.  Dispense: 1 tablet; Refill: 0  2. Menorrhagia with regular cycle Trial Megace.  Red flags given for prompt reevaluation - megestrol (MEGACE) 40 MG tablet; Take 1 tablet (40 mg total) by mouth daily.  Dispense: 10 tablet; Refill: 0 - Comp. Metabolic Panel (12) - TSH  3. Anemia, unspecified type  - CBC with Differential/Platelet - Iron, TIBC and Ferritin Panel   I have reviewed the patient's medical history (PMH, PSH, Social History, Family History, Medications, and allergies) , and have been updated if relevant. I spent 32 minutes reviewing chart and  face to face time with patient.    Meds ordered this encounter  Medications  . megestrol (MEGACE) 40 MG tablet    Sig: Take 1 tablet (40 mg total) by mouth daily.    Dispense:  10 tablet    Refill:  0    Order Specific Question:   Supervising Provider    Answer:   Joya Gaskins, PATRICK E [[id number]]  . fluconazole (DIFLUCAN) 150 MG tablet    Sig: Take 1 tablet (150 mg total) by mouth once for 1 dose.    Dispense:  1 tablet    Refill:  0    Order Specific Question:   Supervising Provider    Answer:   Elsie Stain [[id number]]    Follow-up: Return if symptoms worsen or fail to improve.    Loraine Grip Mayers, PA-C

## 2020-11-02 ENCOUNTER — Other Ambulatory Visit: Payer: Self-pay

## 2020-11-02 LAB — CERVICOVAGINAL ANCILLARY ONLY
Bacterial Vaginitis (gardnerella): NEGATIVE
Candida Glabrata: NEGATIVE
Candida Vaginitis: POSITIVE — AB
Chlamydia: NEGATIVE
Comment: NEGATIVE
Comment: NEGATIVE
Comment: NEGATIVE
Comment: NEGATIVE
Comment: NEGATIVE
Comment: NORMAL
Neisseria Gonorrhea: NEGATIVE
Trichomonas: NEGATIVE

## 2020-11-02 LAB — COMP. METABOLIC PANEL (12)
AST: 59 IU/L — ABNORMAL HIGH (ref 0–40)
Albumin/Globulin Ratio: 0.9 — ABNORMAL LOW (ref 1.2–2.2)
Albumin: 4 g/dL (ref 3.8–4.8)
Alkaline Phosphatase: 181 IU/L — ABNORMAL HIGH (ref 44–121)
BUN/Creatinine Ratio: 17 (ref 9–23)
BUN: 10 mg/dL (ref 6–24)
Bilirubin Total: 0.4 mg/dL (ref 0.0–1.2)
Calcium: 9.1 mg/dL (ref 8.7–10.2)
Chloride: 99 mmol/L (ref 96–106)
Creatinine, Ser: 0.58 mg/dL (ref 0.57–1.00)
Globulin, Total: 4.3 g/dL (ref 1.5–4.5)
Glucose: 384 mg/dL — ABNORMAL HIGH (ref 65–99)
Potassium: 4.2 mmol/L (ref 3.5–5.2)
Sodium: 132 mmol/L — ABNORMAL LOW (ref 134–144)
Total Protein: 8.3 g/dL (ref 6.0–8.5)
eGFR: 117 mL/min/{1.73_m2} (ref >59)

## 2020-11-02 LAB — CBC WITH DIFFERENTIAL/PLATELET
Basophils Absolute: 0.1 10*3/uL (ref 0.0–0.2)
Basos: 0 %
EOS (ABSOLUTE): 0.5 10*3/uL — ABNORMAL HIGH (ref 0.0–0.4)
Eos: 3 %
Hematocrit: 32.7 % — ABNORMAL LOW (ref 34.0–46.6)
Hemoglobin: 9.9 g/dL — ABNORMAL LOW (ref 11.1–15.9)
Immature Grans (Abs): 0 10*3/uL (ref 0.0–0.1)
Immature Granulocytes: 0 %
Lymphocytes Absolute: 4.7 10*3/uL — ABNORMAL HIGH (ref 0.7–3.1)
Lymphs: 31 %
MCH: 21.1 pg — ABNORMAL LOW (ref 26.6–33.0)
MCHC: 30.3 g/dL — ABNORMAL LOW (ref 31.5–35.7)
MCV: 70 fL — ABNORMAL LOW (ref 79–97)
Monocytes Absolute: 0.6 10*3/uL (ref 0.1–0.9)
Monocytes: 4 %
Neutrophils Absolute: 9.4 10*3/uL — ABNORMAL HIGH (ref 1.4–7.0)
Neutrophils: 62 %
Platelets: 457 10*3/uL — ABNORMAL HIGH (ref 150–450)
RBC: 4.7 x10E6/uL (ref 3.77–5.28)
RDW: 16.9 % — ABNORMAL HIGH (ref 11.7–15.4)
WBC: 15.3 10*3/uL — ABNORMAL HIGH (ref 3.4–10.8)

## 2020-11-02 LAB — IRON,TIBC AND FERRITIN PANEL
Ferritin: 15 ng/mL (ref 15–150)
Iron Saturation: 4 % — CL (ref 15–55)
Iron: 13 ug/dL — ABNORMAL LOW (ref 27–159)
Total Iron Binding Capacity: 342 ug/dL (ref 250–450)
UIBC: 329 ug/dL (ref 131–425)

## 2020-11-02 LAB — TSH: TSH: 1.16 u[IU]/mL (ref 0.450–4.500)

## 2020-11-02 MED ORDER — MEGESTROL ACETATE 40 MG PO TABS
40.0000 mg | ORAL_TABLET | Freq: Every day | ORAL | 0 refills | Status: DC
  Filled 2020-11-02: qty 10, 10d supply, fill #0

## 2020-11-02 MED ORDER — ETONOGESTREL-ETHINYL ESTRADIOL 0.12-0.015 MG/24HR VA RING
VAGINAL_RING | VAGINAL | 6 refills | Status: DC
  Filled 2020-11-02: qty 1, 28d supply, fill #0
  Filled 2021-01-14: qty 1, 28d supply, fill #1
  Filled 2021-02-13: qty 1, 28d supply, fill #2
  Filled 2021-03-10: qty 1, 28d supply, fill #3
  Filled 2021-03-11: qty 2, 56d supply, fill #3

## 2020-11-03 ENCOUNTER — Other Ambulatory Visit: Payer: Self-pay

## 2020-11-03 ENCOUNTER — Telehealth: Payer: Self-pay | Admitting: *Deleted

## 2020-11-03 NOTE — Telephone Encounter (Signed)
Medical Assistant used [employer] to contact patient.  Interpreter Name: Trixie Deis #: [id number] Patient is aware of lab results being back and needing to speak with the provider in person regarding them. Patient was offered appointment hold for today or tomorrow. Patient will report to [facility] tomorrow at 9am to discuss with provider.

## 2020-11-03 NOTE — Telephone Encounter (Signed)
-----   Message from Roney Jaffe, [place] sent at 11/02/2020 10:16 AM EDT ----- Please call patient and ask her to return to the mobile unit to discuss her lab results asap

## 2020-11-08 ENCOUNTER — Other Ambulatory Visit: Payer: Self-pay

## 2020-11-08 ENCOUNTER — Ambulatory Visit: Payer: Self-pay | Admitting: Physician Assistant

## 2020-11-08 VITALS — BP 113/75 | HR 78 | Temp 98.2°F | Resp 18 | Ht 61.0 in | Wt 258.0 lb

## 2020-11-08 DIAGNOSIS — D509 Iron deficiency anemia, unspecified: Secondary | ICD-10-CM

## 2020-11-08 DIAGNOSIS — E1165 Type 2 diabetes mellitus with hyperglycemia: Secondary | ICD-10-CM

## 2020-11-08 DIAGNOSIS — B379 Candidiasis, unspecified: Secondary | ICD-10-CM | POA: Insufficient documentation

## 2020-11-08 MED ORDER — TRUEPLUS LANCETS 28G MISC
1.0000 | Freq: Two times a day (BID) | 0 refills | Status: AC
  Filled 2020-11-08: qty 100, 50d supply, fill #0

## 2020-11-08 MED ORDER — TRUE METRIX BLOOD GLUCOSE TEST VI STRP
ORAL_STRIP | 12 refills | Status: AC
  Filled 2020-11-08: qty 100, 25d supply, fill #0

## 2020-11-08 MED ORDER — FLUCONAZOLE 150 MG PO TABS
150.0000 mg | ORAL_TABLET | Freq: Once | ORAL | 0 refills | Status: AC
  Filled 2020-11-08: qty 1, 1d supply, fill #0

## 2020-11-08 MED ORDER — TRUE METRIX METER W/DEVICE KIT
1.0000 [IU] | PACK | Freq: Two times a day (BID) | 0 refills | Status: AC
  Filled 2020-11-08: qty 1, 30d supply, fill #0

## 2020-11-08 MED ORDER — METFORMIN HCL 500 MG PO TABS
500.0000 mg | ORAL_TABLET | Freq: Two times a day (BID) | ORAL | 0 refills | Status: DC
  Filled 2020-11-08: qty 60, 30d supply, fill #0

## 2020-11-08 NOTE — Progress Notes (Signed)
Patient presents to discuss labs with provider and next steps. Patient denies any pain at this time.

## 2020-11-08 NOTE — Progress Notes (Signed)
Established Patient Office Visit  Subjective:  Patient ID: Nancy [name], female    DOB: 1981/04/27  Age: 40 y.o. MRN: [id number]  CC:  Chief Complaint  Patient presents with  . Results    HPI Nancy [name] reports that she did have her menstrual bleeding stopped after taking the Megace.  Reports that she continues to have vaginal itching despite taking the dose of Diflucan, denies vaginal discharge.  Reports that she has continued to take that iron on a daily basis, states that she has been taking it for 2 weeks now.  Reports that it does cause her some GI discomfort but states that it is manageable.  Reports that she has previously been diagnosed with elevated blood glucose levels, states that she worked on her diet and weight loss with resolution.  Reports that she has had a significant amount of weight gain since the beginning of the [place] pandemic.  Reports that she has been drinking a lot of sugary drinks and having a lot of sugary snacks.    Past Medical History:  Diagnosis Date  . Anemia   . Gallstones     Past Surgical History:  Procedure Laterality Date  . CESAREAN SECTION    . CHOLECYSTECTOMY N/A 10/13/2012   Procedure: LAPAROSCOPIC CHOLECYSTECTOMY WITH INTRAOPERATIVE CHOLANGIOGRAM;  Surgeon: Harl Bowie, MD;  Location: [facility];  Service: General;  Laterality: N/A;    History reviewed. No pertinent family history.  Social History   Socioeconomic History  . Marital status: Married    Spouse name: Not on file  . Number of children: Not on file  . Years of education: Not on file  . Highest education level: Not on file  Occupational History  . Not on file  Tobacco Use  . Smoking status: Never Smoker  . Smokeless tobacco: Never Used  Vaping Use  . Vaping Use: Never used  Substance and Sexual Activity  . Alcohol use: No  . Drug use: No  . Sexual activity: Yes    Birth control/protection: None  Other Topics Concern  . Not on file  Social  History Narrative  . Not on file   Social Determinants of Health   Financial Resource Strain: Not on file  Food Insecurity: Not on file  Transportation Needs: Not on file  Physical Activity: Not on file  Stress: Not on file  Social Connections: Not on file  Intimate Partner Violence: Not on file    Outpatient Medications Prior to Visit  Medication Sig Dispense Refill  . cetirizine (ZYRTEC) 10 MG tablet Take 1 tablet (10 mg total) by mouth daily. 30 tablet 11  . etonogestrel-ethinyl estradiol (NUVARING) 0.12-0.015 MG/24HR vaginal ring INSERT VAGINALLY AND LEAVE IN PLACE FOR 3 CONSECUTIVE WEEKS, THEN REMOVE FOR 1 WEEK. 1 each 6  . megestrol (MEGACE) 40 MG tablet Take 1 tablet (40 mg total) by mouth daily. 10 tablet 0   No facility-administered medications prior to visit.    Allergy to penicillin   ROS Review of Systems  Constitutional: Positive for fatigue. Negative for chills and fever.  HENT: Negative.   Eyes: Negative.   Respiratory: Negative for shortness of breath.   Cardiovascular: Negative for chest pain.  Gastrointestinal: Negative.   Endocrine: Negative.   Genitourinary: Negative for vaginal bleeding and vaginal discharge.  Musculoskeletal: Negative.   Skin: Negative.   Allergic/Immunologic: Negative.   Neurological: Negative.   Hematological: Negative.   Psychiatric/Behavioral: Negative.       Objective:  Physical Exam Vitals and nursing note reviewed.  Constitutional:      Appearance: Normal appearance. She is obese.  HENT:     Head: Normocephalic and atraumatic.     Right Ear: External ear normal.     Left Ear: External ear normal.     Nose: Nose normal.     Mouth/Throat:     Pharynx: Oropharynx is clear.  Eyes:     Pupils: Pupils are equal, round, and reactive to light.  Cardiovascular:     Rate and Rhythm: Normal rate and regular rhythm.     Pulses: Normal pulses.     Heart sounds: Normal heart sounds.  Pulmonary:     Effort: Pulmonary  effort is normal.     Breath sounds: Normal breath sounds.  Musculoskeletal:        General: Normal range of motion.     Cervical back: Normal range of motion and neck supple.  Skin:    General: Skin is warm and dry.  Neurological:     General: No focal deficit present.     Mental Status: She is alert and oriented to person, place, and time.  Psychiatric:        Mood and Affect: Mood normal.        Behavior: Behavior normal.        Thought Content: Thought content normal.        Judgment: Judgment normal.     BP 113/75 (BP Location: Left Arm, Patient Position: Sitting, Cuff Size: Large)   Pulse 78   Temp 98.2 F (36.8 C) (Oral)   Resp 18   Ht $R'5\' 1"'cT$  (1.549 m)   Wt 258 lb (117 kg)   LMP 10/29/2020   SpO2 100%   BMI 48.75 kg/m  Wt Readings from Last 3 Encounters:  11/08/20 258 lb (117 kg)  11/01/20 258 lb (117 kg)  12/25/19 259 lb (117.5 kg)     Health Maintenance Due  Topic Date Due  . PNEUMOCOCCAL POLYSACCHARIDE VACCINE AGE 15-64 HIGH RISK  Never done  . COVID-19 Vaccine (1) Never done  . FOOT EXAM  Never done  . OPHTHALMOLOGY EXAM  Never done  . URINE MICROALBUMIN  Never done  . Hepatitis C Screening  Never done  . PAP SMEAR-Modifier  06/17/2017  . HEMOGLOBIN A1C  08/04/2017    There are no preventive care reminders to display for this patient.  Lab Results  Component Value Date   TSH 1.160 11/01/2020   Lab Results  Component Value Date   WBC 15.3 (H) 11/01/2020   HGB 9.9 (L) 11/01/2020   HCT 32.7 (L) 11/01/2020   MCV 70 (L) 11/01/2020   PLT 457 (H) 11/01/2020   Lab Results  Component Value Date   NA 132 (L) 11/01/2020   K 4.2 11/01/2020   CO2 23 02/01/2017   GLUCOSE 384 (H) 11/01/2020   BUN 10 11/01/2020   CREATININE 0.58 11/01/2020   BILITOT 0.4 11/01/2020   ALKPHOS 181 (H) 11/01/2020   AST 59 (H) 11/01/2020   ALT 14 02/01/2017   PROT 8.3 11/01/2020   ALBUMIN 4.0 11/01/2020   CALCIUM 9.1 11/01/2020   EGFR 117 11/01/2020   Lab Results   Component Value Date   CHOL 156 06/14/2015   Lab Results  Component Value Date   HDL 17 (L) 06/14/2015   Lab Results  Component Value Date   LDLCALC 102 06/14/2015   Lab Results  Component Value Date   TRIG 185 (H)  06/14/2015   Lab Results  Component Value Date   CHOLHDL 9.2 (H) 06/14/2015   Lab Results  Component Value Date   HGBA1C 5.5 02/01/2017      Assessment & Plan:   Problem List Items Addressed This Visit      Endocrine   Uncontrolled type 2 diabetes mellitus with hyperglycemia ([place]) - Primary   Relevant Medications   glucose blood (TRUE METRIX BLOOD GLUCOSE TEST) test strip   TRUEplus Lancets 28G MISC   Blood Glucose Monitoring Suppl (TRUE METRIX METER) w/Device KIT   metFORMIN (GLUCOPHAGE) 500 MG tablet     Other   Obesity, Class III, BMI 40-49.9 (morbid obesity) ([facility])   Relevant Medications   metFORMIN (GLUCOPHAGE) 500 MG tablet   Iron deficiency anemia   Yeast infection   Relevant Medications   fluconazole (DIFLUCAN) 150 MG tablet    1. Uncontrolled type 2 diabetes mellitus with hyperglycemia ([place]) New onset diabetes, A1c 11.2.  Patient has upcoming appointment with her primary care provider on Nov 11, 2020 for Pap and birth control discussion.  Patient encouraged to start metformin 500 mg twice a day, check blood glucose levels twice a day, keep a written log and have available for all office visits.  Patient given appointment [facility] to review blood glucose levels, potential titration of medication.  Patient education given on diabetic diet.  Red flags given for prompt reevaluation.   - glucose blood (TRUE METRIX BLOOD GLUCOSE TEST) test strip; Use as instructed  Dispense: 100 each; Refill: 12 - TRUEplus Lancets 28G MISC; Use as directed twice daily.  Dispense: 100 each; Refill: 0 - Blood Glucose Monitoring Suppl (TRUE METRIX METER) w/Device KIT; 1 Units by Does not  apply route in the morning and at bedtime.  Dispense: 1 kit; Refill: 0 - metFORMIN (GLUCOPHAGE) 500 MG tablet; Take 1 tablet (500 mg total) by mouth 2 (two) times daily with a meal.  Dispense: 60 tablet; Refill: 0  2. Iron deficiency anemia, unspecified iron deficiency anemia type Continue iron on a daily basis  3. Yeast infection Trial Diflucan - fluconazole (DIFLUCAN) 150 MG tablet; Take 1 tablet (150 mg total) by mouth once for 1 dose.  Dispense: 1 tablet; Refill: 0  4. Obesity, Class III, BMI 40-49.9 (morbid obesity) ([place])    I have reviewed the patient's medical history (PMH, PSH, Social History, Family History, Medications, and allergies) , and have been updated if relevant. I spent 32 minutes reviewing chart and  face to face time with patient.      Meds ordered this encounter  Medications  . fluconazole (DIFLUCAN) 150 MG tablet    Sig: Take 1 tablet (150 mg total) by mouth once for 1 dose.    Dispense:  1 tablet    Refill:  0    Order Specific Question:   Supervising Provider    Answer:   Asencion Noble E [[id number]]  . glucose blood (TRUE METRIX BLOOD GLUCOSE TEST) test strip    Sig: Use as instructed    Dispense:  100 each    Refill:  12    Order Specific Question:   Supervising Provider    Answer:   Asencion Noble E [[id number]]  . TRUEplus Lancets 28G MISC    Sig: Use as directed twice daily.    Dispense:  100 each    Refill:  0    Order Specific Question:   Supervising Provider  Answer:   Elsie Stain [[id number]]  . Blood Glucose Monitoring Suppl (TRUE METRIX METER) w/Device KIT    Sig: 1 Units by Does not apply route in the morning and at bedtime.    Dispense:  1 kit    Refill:  0    Order Specific Question:   Supervising Provider    Answer:   Joya Gaskins, PATRICK E [[id number]]  . metFORMIN (GLUCOPHAGE) 500 MG tablet    Sig: Take 1 tablet (500 mg total) by mouth 2 (two) times daily with a meal.    Dispense:  60 tablet    Refill:  0    Order Specific Question:    Supervising Provider    Answer:   Elsie Stain [[id number]]    Follow-up: Return in about 3 days (around 11/11/2020) for with PCP.    Loraine Grip Mayers, PA-C

## 2020-11-08 NOTE — Patient Instructions (Addendum)
Your A1C is 11.4  You will start metformin 500 mg twice a day, you will also need to check your blood sugar levels twice a day, keep a written log and have available for all office visits.  I encourage you to reduce the amount of added sugar you are consuming every day to less than 24 gm.  All foods have nutrition labels on them for you to be able to track this information.  Make sure to take the iron on a daily basis as well  Please make sure to keep your follow-up appointment with your primary care provider.  Please let us know if there is anything else we can do for you  Roney Jaffe, PA-C Physician Assistant [facility] [email or web address]     https://www.diabeteseducator.org/docs/default-source/living-with-diabetes/conquering-the-grocery-store-v1.pdf?sfvrsn=4">  Carbohydrate Counting for Diabetes Mellitus, Adult Carbohydrate counting is a method of keeping track of how many carbohydrates you eat. Eating carbohydrates naturally increases the amount of sugar (glucose) in the blood. Counting how many carbohydrates you eat improves your blood glucose control, which helps you manage your diabetes. It is important to know how many carbohydrates you can safely have in each meal. This is different for every person. A dietitian can help you make a meal plan and calculate how many carbohydrates you should have at each meal and snack. What foods contain carbohydrates? Carbohydrates are found in the following foods:  Grains, such as breads and cereals.  Dried beans and soy products.  Starchy vegetables, such as potatoes, peas, and corn.  Fruit and fruit juices.  Milk and yogurt.  Sweets and snack foods, such as cake, cookies, candy, chips, and soft drinks.   How do I count carbohydrates in foods? There are two ways to count carbohydrates in food. You can read food labels or learn standard serving sizes of foods. You can use either  of the methods or a combination of both. Using the Nutrition Facts label The Nutrition Facts list is included on the labels of almost all packaged foods and beverages in the [place] It includes:  The serving size.  Information about nutrients in each serving, including the grams (g) of carbohydrate per serving. To use the Nutrition Facts:  Decide how many servings you will have.  Multiply the number of servings by the number of carbohydrates per serving.  The resulting number is the total amount of carbohydrates that you will be having. Learning the standard serving sizes of foods When you eat carbohydrate foods that are not packaged or do not include Nutrition Facts on the label, you need to measure the servings in order to count the amount of carbohydrates.  Measure the foods that you will eat with a food scale or measuring cup, if needed.  Decide how many standard-size servings you will eat.  Multiply the number of servings by 15. For foods that contain carbohydrates, one serving equals 15 g of carbohydrates. ? For example, if you eat 2 cups or 10 oz (300 g) of strawberries, you will have eaten 2 servings and 30 g of carbohydrates (2 servings x 15 g = 30 g).  For foods that have more than one food mixed, such as soups and casseroles, you must count the carbohydrates in each food that is included. The following list contains standard serving sizes of common carbohydrate-rich foods. Each of these servings has about 15 g of carbohydrates:  1 slice of bread.  1 six-inch (15 cm) tortilla.  ? cup or 2 oz (53 g) cooked rice  or pasta.   cup or 3 oz (85 g) cooked or canned, drained and rinsed beans or lentils.   cup or 3 oz (85 g) starchy vegetable, such as peas, corn, or squash.   cup or 4 oz (120 g) hot cereal.   cup or 3 oz (85 g) boiled or mashed potatoes, or  or 3 oz (85 g) of a large baked potato.   cup or 4 fl oz (118 mL) fruit juice.  1 cup or 8 fl oz (237 mL)  milk.  1 small or 4 oz (106 g) apple.   or 2 oz (63 g) of a medium banana.  1 cup or 5 oz (150 g) strawberries.  3 cups or 1 oz (24 g) popped popcorn. What is an example of carbohydrate counting? To calculate the number of carbohydrates in this sample meal, follow the steps shown below. Sample meal  3 oz (85 g) chicken breast.  ? cup or 4 oz (106 g) brown rice.   cup or 3 oz (85 g) corn.  1 cup or 8 fl oz (237 mL) milk.  1 cup or 5 oz (150 g) strawberries with sugar-free whipped topping. Carbohydrate calculation 1. Identify the foods that contain carbohydrates: ? Rice. ? Corn. ? Milk. ? Strawberries. 2. Calculate how many servings you have of each food: ? 2 servings rice. ? 1 serving corn. ? 1 serving milk. ? 1 serving strawberries. 3. Multiply each number of servings by 15 g: ? 2 servings rice x 15 g = 30 g. ? 1 serving corn x 15 g = 15 g. ? 1 serving milk x 15 g = 15 g. ? 1 serving strawberries x 15 g = 15 g. 4. Add together all of the amounts to find the total grams of carbohydrates eaten: ? 30 g + 15 g + 15 g + 15 g = 75 g of carbohydrates total. What are tips for following this plan? Shopping  Develop a meal plan and then make a shopping list.  Buy fresh and frozen vegetables, fresh and frozen fruit, dairy, eggs, beans, lentils, and whole grains.  Look at food labels. Choose foods that have more fiber and less sugar.  Avoid processed foods and foods with added sugars. Meal planning  Aim to have the same amount of carbohydrates at each meal and for each snack time.  Plan to have regular, balanced meals and snacks. Where to find more information  American Diabetes Association: www.diabetes.org  Centers for Disease Control and Prevention: [employer]Ex[employer].com.br Summary  Carbohydrate counting is a method of keeping track of how many carbohydrates you eat.  Eating carbohydrates naturally increases the amount of sugar (glucose) in the blood.  Counting how many  carbohydrates you eat improves your blood glucose control, which helps you manage your diabetes.  A dietitian can help you make a meal plan and calculate how many carbohydrates you should have at each meal and snack. This information is not intended to replace advice given to you by your health care provider. Make sure you discuss any questions you have with your health care provider. Document Revised: 06/12/2019 Document Reviewed: 06/13/2019 Elsevier Patient Education  2021 [employer].

## 2020-11-09 ENCOUNTER — Other Ambulatory Visit: Payer: Self-pay

## 2020-11-10 DIAGNOSIS — E1165 Type 2 diabetes mellitus with hyperglycemia: Secondary | ICD-10-CM | POA: Insufficient documentation

## 2020-11-11 ENCOUNTER — Ambulatory Visit (INDEPENDENT_AMBULATORY_CARE_PROVIDER_SITE_OTHER): Payer: Self-pay | Admitting: Primary Care

## 2021-01-14 ENCOUNTER — Other Ambulatory Visit: Payer: Self-pay

## 2021-01-18 ENCOUNTER — Other Ambulatory Visit: Payer: Self-pay

## 2021-02-14 ENCOUNTER — Other Ambulatory Visit: Payer: Self-pay

## 2021-02-16 ENCOUNTER — Other Ambulatory Visit: Payer: Self-pay

## 2021-03-10 ENCOUNTER — Other Ambulatory Visit: Payer: Self-pay

## 2021-03-11 ENCOUNTER — Other Ambulatory Visit: Payer: Self-pay

## 2021-03-15 ENCOUNTER — Other Ambulatory Visit: Payer: Self-pay

## 2021-04-06 ENCOUNTER — Ambulatory Visit (INDEPENDENT_AMBULATORY_CARE_PROVIDER_SITE_OTHER): Payer: Self-pay | Admitting: Primary Care

## 2021-04-21 ENCOUNTER — Ambulatory Visit (INDEPENDENT_AMBULATORY_CARE_PROVIDER_SITE_OTHER): Payer: Self-pay | Admitting: Primary Care

## 2021-05-16 ENCOUNTER — Ambulatory Visit (INDEPENDENT_AMBULATORY_CARE_PROVIDER_SITE_OTHER): Payer: Self-pay

## 2021-05-16 NOTE — Telephone Encounter (Signed)
Pt called back, states she has yeast infection that is itchy and burns and burns worse when she urinates. Says this has been going on for 10 days. Bought OTC supp for yeast and they are not working. She says she's had yeast infections before and wants to know if something can be called into the pharamcy. Attempted to schedule appt but nothing available until 06/07/21. Mobile unit is not working this week which is where pt has went in the past. I advised pt that I will send this note over to the office and let Marcelino Duster, NP know and she wants someone to call her whether or not medication can be called in. Advised pt she can do virtual UC visit but she wants to see if something can be called in first. Care advice given and pt verbalized understanding. No other questions/concerns noted.     Reason for Disposition  [1] Vaginal itching AND [2] not improved > 3 days following CARE ADVICE  Answer Assessment - Initial Assessment Questions 1. SYMPTOM: "What's the main symptom you're concerned about?" (e.g., pain, itching, dryness)     Itching, burning, and burning when she pees 2. LOCATION: "Where is the  *No Answer* located?" (e.g., inside/outside, left/right)     *No Answer* 3. ONSET: "When did the  yeast symptoms  start?"     10 days  4. PAIN: "Is there any pain?" If Yes, ask: "How bad is it?" (Scale: 1-10; mild, moderate, severe)     6-7 5. ITCHING: "Is there any itching?" If Yes, ask: "How bad is it?" (Scale: 1-10; mild, moderate, severe)     8 and night increases 6. CAUSE: "What do you think is causing the discharge?" "Have you had the same problem before? What happened then?"     Yeast infection 7. OTHER SYMPTOMS: "Do you have any other symptoms?" (e.g., fever, itching, vaginal bleeding, pain with urination, injury to genital area, vaginal foreign body)     Itching, pain with urination  8. PREGNANCY: "Is there any chance you are pregnant?" "When was your last menstrual period?"     No  Protocols  used: Vaginal Symptoms-A-AH

## 2021-06-13 ENCOUNTER — Other Ambulatory Visit: Payer: Self-pay

## 2021-06-13 ENCOUNTER — Encounter (INDEPENDENT_AMBULATORY_CARE_PROVIDER_SITE_OTHER): Payer: Self-pay | Admitting: Primary Care

## 2021-06-13 ENCOUNTER — Ambulatory Visit (INDEPENDENT_AMBULATORY_CARE_PROVIDER_SITE_OTHER): Payer: Self-pay | Admitting: Primary Care

## 2021-06-13 ENCOUNTER — Other Ambulatory Visit (HOSPITAL_COMMUNITY)
Admission: RE | Admit: 2021-06-13 | Discharge: 2021-06-13 | Disposition: A | Discharge status: Home / Self Care | Payer: Self-pay | Source: Ambulatory Visit | Attending: Primary Care | Admitting: Primary Care

## 2021-06-13 VITALS — BP 127/86 | HR 105 | Temp 97.3°F | Ht 60.0 in | Wt 252.4 lb

## 2021-06-13 DIAGNOSIS — Z131 Encounter for screening for diabetes mellitus: Secondary | ICD-10-CM

## 2021-06-13 DIAGNOSIS — E1165 Type 2 diabetes mellitus with hyperglycemia: Secondary | ICD-10-CM

## 2021-06-13 DIAGNOSIS — N898 Other specified noninflammatory disorders of vagina: Secondary | ICD-10-CM | POA: Insufficient documentation

## 2021-06-13 DIAGNOSIS — D508 Other iron deficiency anemias: Secondary | ICD-10-CM

## 2021-06-13 DIAGNOSIS — R300 Dysuria: Secondary | ICD-10-CM

## 2021-06-13 LAB — POCT URINALYSIS DIP (CLINITEK)
Bilirubin, UA: NEGATIVE
Blood, UA: NEGATIVE
Glucose, UA: 500 mg/dL — AB
Ketones, POC UA: NEGATIVE mg/dL
Leukocytes, UA: NEGATIVE
Nitrite, UA: NEGATIVE
POC PROTEIN,UA: NEGATIVE
Spec Grav, UA: 1.01 (ref 1.010–1.025)
Urobilinogen, UA: 0.2 E.U./dL
pH, UA: 5 (ref 5.0–8.0)

## 2021-06-13 LAB — POCT GLYCOSYLATED HEMOGLOBIN (HGB A1C): Hemoglobin A1C: 11.2 % — AB (ref 4.0–5.6)

## 2021-06-13 MED ORDER — EMPAGLIFLOZIN 25 MG PO TABS
25.0000 mg | ORAL_TABLET | Freq: Every day | ORAL | 1 refills | Status: AC
  Filled 2021-06-13 – 2022-02-07 (×2): qty 30, 30d supply, fill #0

## 2021-06-13 MED ORDER — GLIPIZIDE 10 MG PO TABS
10.0000 mg | ORAL_TABLET | Freq: Two times a day (BID) | ORAL | 1 refills | Status: AC
  Filled 2021-06-13 – 2022-02-07 (×2): qty 60, 30d supply, fill #0

## 2021-06-13 MED ORDER — TERCONAZOLE 0.4 % VA CREA
1.0000 | TOPICAL_CREAM | Freq: Every day | VAGINAL | 0 refills | Status: DC
  Filled 2021-06-13: qty 45, 7d supply, fill #0

## 2021-06-13 MED ORDER — METFORMIN HCL 1000 MG PO TABS
1000.0000 mg | ORAL_TABLET | Freq: Two times a day (BID) | ORAL | 1 refills | Status: DC
  Filled 2021-06-13: qty 60, 30d supply, fill #0

## 2021-06-13 NOTE — Progress Notes (Signed)
Nuvaring ??????need prescription for it

## 2021-06-13 NOTE — Patient Instructions (Signed)
Plan de accin para la diabetes mellitus Diabetes Mellitus Action Plan Seguir un plan de accin para la diabetes es una forma de controlar sus sntomas de diabetes (diabetes mellitus). El plan se codifica con colores para ayudarlo a comprender qu acciones necesita tomar en funcin de los sntomas que est teniendo. Si tiene sntomas pertenecientes a la zona roja, necesita buscar atencin mdica inmediatamente. Si tiene sntomas pertenecientes a la zona amarilla, est teniendo problemas. Si tiene sntomas pertenecientes a la zona verde, significa que se encuentra bien. Aprender y comprender la diabetes puede llevar tiempo. Siga el plan que elabor con el mdico. Conozca el rango deseado para su nivel de azcar en la sangre (glucosa), y revise su plan de tratamiento con su mdico en cada visita. El rango deseado para mi nivel de azcar en la sangre es__________________________ mg/dl. Zona roja Obtenga ayuda de inmediato si observa cualquiera de estos sntomas: Un resultado de azcar en la sangre que est por debajo de 54 mg/dl (3 mmol/l). Un nivel de azcar en la sangre mayor o igual que 240 mg/dl (13,3 mmol/l) durante 2 das seguidos. Confusin o dificultad para pensar con claridad. Dificultad para respirar. Malestar o fiebre durante 2 o ms das que no mejora. Niveles moderados o altos de cetonas en la orina. Sentirse cansado o sin energa. Si tiene cualquiera de los sntomas pertenecientes a la zona roja, no espere para ver si desaparecen. Solicite atencin mdica de inmediato. Comunquese con el servicio de emergencias de su localidad (911 en los [place]). No conduzca por sus propios medios hasta el hospital. Si tiene un nivel de azcar en la sangre muy bajo (hipoglucemia grave) y no puede ingerir ningn alimento ni bebida, tal vez necesite glucagn. Asegrese de que un familiar o amigo sepa controlarle el nivel de azcar en la sangre y aplicarle glucagn. Puede necesitar tratamiento en  un hospital paraesta afeccin. Zona amarilla Si tiene alguno de los siguientes sntomas, su diabetes no est controlada y usted necesitar hacer algunos cambios: Un nivel de azcar en la sangre mayor o igual que 240 mg/dl (13,3 mmol/l) durante 2 das seguidos. Un resultado de azcar en la sangre que est por debajo de 70 mg/dl (3,9 mmol/l). Otros sntomas de hipoglucemia, como: Temblores o sensacin de desvanecimiento. Confusin o irritabilidad. Sensacin de hambre. Latidos cardacos acelerados. Si tiene algn sntoma de la zona amarilla: Trate la hipoglucemia comiendo o bebiendo 15 gramos de hidratos de carbono de accin rpida. Siga las regla 15:15: Consuma 15 gramos de hidratos de carbono de accin rpida, como: 1 pomo de glucosa en gel. 4 comprimidos de glucosa. 4 onzas (120 ml) de jugo de frutas. 4 onzas (120 ml) de refresco comn (no diettico). Controle su nivel de azcar en la sangre 15 minutos despus de ingerir el hidrato de carbono. Si este nuevo nivel de azcar en la sangre todava es igual o menor que 70 mg/dl (3,9 mmol/l), ingiera nuevamente 15 gramos de un hidrato de carbono. Si el nivel de azcar en la sangre no supera los 70 mg/dl (3,9 mmol/l) despus de 3 intentos, solicite ayuda mdica de inmediato. Ingiera una comida o un refrigerio en el transcurso de 1 hora despus de que el nivel de azcar en la sangre se haya normalizado. Siga tomando los medicamentos diarios como se lo haya indicado el mdico. Controle su nivel de azcar en la sangre con ms frecuencia que lo hara normalmente. Anote los resultados. Llame al mdico si tiene problemas para mantener el nivel de azcar en la   sangre dentro del rango deseado.  Zona verde Estos signos significan que se encuentra bien y puede continuar haciendo lo que est haciendo para controlar la diabetes: Su nivel de azcar en la sangre est en el rango deseado. En la mayora de las personas, el nivel de azcar en la sangre antes de  una comida (preprandial) debera ser de 80 a 130 mg/dl (de 4.4 a 7.2 mmol/l). Se siente bien, y pueda volver a realizar las actividades diarias. Si se encuentra en la zona verde, contine controlando su diabetes como se lo haya indicado el mdico. Para hacer esto: Siga una dieta saludable. Haga ejercicio regularmente. Controle su nivel de azcar en la sangre como se lo haya indicado el mdico. Tome los medicamentos como se lo haya indicado el mdico.  Dnde buscar ms informacin American Diabetes Association (ADA) (Asociacin [place] de la Diabetes): diabetes.org Association of Diabetes Care & Education Specialists (ADCES) (Asociacin de Especialistas en Atencin y Educacin sobre la Diabetes): diabeteseducator.org Resumen Seguir un plan de accin para la diabetes, es una forma de controlar sus sntomas de diabetes. El plan se codifica con colores para ayudarlo a comprender qu acciones necesita tomar en funcin de los sntomas que est teniendo. Siga el plan que elabor con el mdico. Asegrese de conocer su nivel deseado de azcar en la sangre. Revise el plan de tratamiento con el mdico en todas las consultas. Esta informacin no tiene como fin reemplazar el consejo del mdico. Asegresede hacerle al mdico cualquier pregunta que tenga. Document Revised: 01/22/2020 Document Reviewed: 01/22/2020 Elsevier Patient Education  2022 Elsevier Inc.  

## 2021-06-13 NOTE — Progress Notes (Signed)
Nancy [name] is a 40 y.o. female ( patient gave husband permission to be present at her apptFuller [name]) who complains of burning with urination and vaginal discharge  for 1 months.  Patient also complains of vaginal discharge. Patient denies congestion, cough, fever, headache, rhinitis, and sorethroat.  Patient does have a history of complaints UTI and candida.   Patient does not have a history of pyelonephritis. Labs indicating elevated glucose which can explained s/s.  The following portions of the patient's history were reviewed and updated as appropriate: allergies, current medications, past family history, past medical history, past social history, and past surgical history. Review of Systems Pertinent items noted in HPI and remainder of comprehensive ROS otherwise negative.    Objective:  BP 127/86 (BP Location: Right Arm, Patient Position: Sitting, Cuff Size: Large)    Pulse (!) 105    Temp (!) 97.3 F (36.3 C) (Temporal)    Ht 5' (1.524 m)    Wt 252 lb 6.4 oz (114.5 kg)    LMP 05/14/2021 (Approximate)    SpO2 96%    BMI 49.29 kg/m     Assessment:  Nancy [name] was seen today for vaginal itching.  Diagnoses and all orders for this visit:  Dysuria -     POCT URINALYSIS DIP (CLINITEK)  Vaginal itching -     Cervicovaginal ancillary only -     terconazole (TERAZOL 7) 0.4 % vaginal cream; Place 1 applicator vaginally at bedtime.  Screening for diabetes mellitus -     HgB A1c 11.2 new dx  Other iron deficiency anemia -     CBC with Differential/Platelet; Future  Uncontrolled type 2 diabetes mellitus with hyperglycemia ([place]) ADA recommends the following therapeutic goals for glycemic control related to A1c measurements: Goal of therapy: Less than 6.5 hemoglobin A1c.  Reference clinical practice recommendations. Foods that are high in carbohydrates are the following rice, potatoes, breads, sugars, and pastas.  Reduction in the intake  (eating) will assist in lowering your blood sugars. Refused medication until Discussed  co- morbidities with uncontrol diabetes  Complications -diabetic retinopathy, (close your eyes ? What do you see nothing) nephropathy decrease in kidney function- can lead to dialysis-on a machine 3 days a week to filter your kidney, neuropathy- numbness and tinging in your hands and feet,  increase risk of heart attack and stroke, and amputation due to decrease wound healing and circulation. Decrease your risk by taking medication daily as prescribed, monitor carbohydrates- foods that are high in carbohydrates are the following rice, potatoes, breads, sugars, and pastas.  Reduction in the intake (eating) will assist in lowering your blood sugars. Exercise daily at least 30 minutes daily.  -     CMP14+EGFR; Future -     Lipid Panel; Future -     CBC with Differential/Platelet; Future  Other orders -     metFORMIN (GLUCOPHAGE) 1000 MG tablet; Take 1 tablet (1,000 mg total) by mouth 2 (two) times daily with a meal. -     glipiZIDE (GLUCOTROL) 10 MG tablet; Take 1 tablet (10 mg total) by mouth 2 (two) times daily before a meal. -     empagliflozin (JARDIANCE) 25 MG TABS tablet; Take 1 tablet (25 mg total) by mouth daily before breakfast.     Nancy [name]

## 2021-06-14 ENCOUNTER — Other Ambulatory Visit: Payer: Self-pay

## 2021-06-14 ENCOUNTER — Other Ambulatory Visit (INDEPENDENT_AMBULATORY_CARE_PROVIDER_SITE_OTHER): Payer: Self-pay | Admitting: Primary Care

## 2021-06-14 DIAGNOSIS — E1165 Type 2 diabetes mellitus with hyperglycemia: Secondary | ICD-10-CM

## 2021-06-14 LAB — CERVICOVAGINAL ANCILLARY ONLY
Bacterial Vaginitis (gardnerella): NEGATIVE
Candida Glabrata: NEGATIVE
Candida Vaginitis: POSITIVE — AB
Chlamydia: NEGATIVE
Comment: NEGATIVE
Comment: NEGATIVE
Comment: NEGATIVE
Comment: NEGATIVE
Comment: NEGATIVE
Comment: NORMAL
Neisseria Gonorrhea: NEGATIVE
Trichomonas: NEGATIVE

## 2021-06-14 MED ORDER — FLUCONAZOLE 150 MG PO TABS
150.0000 mg | ORAL_TABLET | Freq: Once | ORAL | 0 refills | Status: AC
  Filled 2021-06-14: qty 2, 2d supply, fill #0

## 2021-06-16 ENCOUNTER — Other Ambulatory Visit: Payer: Self-pay

## 2021-06-22 ENCOUNTER — Other Ambulatory Visit: Payer: Self-pay

## 2021-07-04 ENCOUNTER — Other Ambulatory Visit (INDEPENDENT_AMBULATORY_CARE_PROVIDER_SITE_OTHER): Payer: Self-pay

## 2021-07-28 ENCOUNTER — Other Ambulatory Visit: Payer: Self-pay

## 2021-09-12 ENCOUNTER — Ambulatory Visit (INDEPENDENT_AMBULATORY_CARE_PROVIDER_SITE_OTHER): Payer: Self-pay | Admitting: Primary Care

## 2021-09-22 ENCOUNTER — Ambulatory Visit (INDEPENDENT_AMBULATORY_CARE_PROVIDER_SITE_OTHER): Payer: Self-pay | Admitting: Primary Care

## 2021-10-17 ENCOUNTER — Other Ambulatory Visit: Payer: Self-pay

## 2021-10-18 ENCOUNTER — Other Ambulatory Visit (HOSPITAL_COMMUNITY): Payer: Self-pay

## 2022-01-16 ENCOUNTER — Other Ambulatory Visit: Payer: Self-pay

## 2022-01-18 ENCOUNTER — Other Ambulatory Visit: Payer: Self-pay

## 2022-01-18 ENCOUNTER — Other Ambulatory Visit (INDEPENDENT_AMBULATORY_CARE_PROVIDER_SITE_OTHER): Payer: Self-pay | Admitting: Primary Care

## 2022-01-19 ENCOUNTER — Other Ambulatory Visit: Payer: Self-pay

## 2022-01-19 ENCOUNTER — Ambulatory Visit (INDEPENDENT_AMBULATORY_CARE_PROVIDER_SITE_OTHER): Payer: Self-pay | Admitting: *Deleted

## 2022-01-19 ENCOUNTER — Other Ambulatory Visit (INDEPENDENT_AMBULATORY_CARE_PROVIDER_SITE_OTHER): Payer: Self-pay | Admitting: Primary Care

## 2022-01-19 DIAGNOSIS — N898 Other specified noninflammatory disorders of vagina: Secondary | ICD-10-CM

## 2022-01-19 NOTE — Telephone Encounter (Signed)
  Chief Complaint: Vaginal burning and itching  She gets yeast infections when it's hot. Symptoms: For a week now having vaginal burning and itching.   Frequency: Daily for a week Pertinent Negatives: Patient denies discharge odor or urinary symptoms.   Disposition: [] ED /[] Urgent Care (no appt availability in office) / [x] Appointment(In office/virtual)/ []  [facility]/ [] Home Care/ [] Refused Recommended Disposition /[] [facility]/ []  Follow-up with PCP Additional Notes: Appt made with , NP for 02/13/2022 at 11:10.   Husband called in but wife with him.   He was interpreting.  They have requested for to refill her Nuvaring rx.   That was submitted by agent.

## 2022-01-19 NOTE — Telephone Encounter (Signed)
Reason for Disposition  [1] Vaginal itching AND [2] not improved > 3 days following Care Advice  Answer Assessment - Initial Assessment Questions 1. SYMPTOM: "What's the main symptom you're concerned about?" (e.g., pain, itching, dryness)     Having itching, burning and swelling.   Discharge none.  Painful.   2. LOCATION: "Where is the  burning  located?" (e.g., inside/outside, left/right)     Vaginal area 3. ONSET: "When did the  burning  start?"     A week ago 4. PAIN: "Is there any pain?" If Yes, ask: "How bad is it?" (Scale: 1-10; mild, moderate, severe)   -  MILD (1-3): Doesn't interfere with normal activities.    -  MODERATE (4-7): Interferes with normal activities (e.g., work or school) or awakens from sleep.     -  SEVERE (8-10): Excruciating pain, unable to do any normal activities.     Yes  burning 5. ITCHING: "Is there any itching?" If Yes, ask: "How bad is it?" (Scale: 1-10; mild, moderate, severe)     Itching This happens when it's hot. 6. CAUSE: "What do you think is causing the discharge?" "Have you had the same problem before? What happened then?"     When it's hot I have this problem. 7. OTHER SYMPTOMS: "Do you have any other symptoms?" (e.g., fever, itching, vaginal bleeding, pain with urination, injury to genital area, vaginal foreign body)     See above 8. PREGNANCY: "Is there any chance you are pregnant?" "When was your last menstrual period?"     No  Protocols used: Vaginal Symptoms-A-AH

## 2022-01-19 NOTE — Telephone Encounter (Signed)
Medication Refill - Medication: (NUVARING) 0.12-0.015 MG/24HR vaginal ring and terconazole (TERAZOL 7) 0.4 % vaginal cream   Has the patient contacted their pharmacy? yes (Agent: If no, request that the patient contact the pharmacy for the refill. If patient does not wish to contact the pharmacy document the reason why and proceed with request.) (Agent: If yes, when and what did the pharmacy advise?)contact pcp  Preferred Pharmacy (with phone number or street name): [facility] [address], [address] [place] [address] Phone: [phone] Fax: [phone] Has the patient been seen for an appointment in the last year OR does the patient have an upcoming appointment? yes  Agent: Please be advised that RX refills may take up to 3 business days. We ask that you follow-up with your pharmacy.

## 2022-01-20 ENCOUNTER — Other Ambulatory Visit: Payer: Self-pay

## 2022-01-20 MED ORDER — TERCONAZOLE 0.4 % VA CREA
1.0000 | TOPICAL_CREAM | Freq: Every day | VAGINAL | 0 refills | Status: DC
  Filled 2022-01-20: qty 45, 7d supply, fill #0

## 2022-01-20 MED ORDER — ETONOGESTREL-ETHINYL ESTRADIOL 0.12-0.015 MG/24HR VA RING
VAGINAL_RING | VAGINAL | 1 refills | Status: AC
  Filled 2022-01-20: qty 1, 28d supply, fill #0

## 2022-01-20 NOTE — Telephone Encounter (Signed)
Requested Prescriptions  Pending Prescriptions Disp Refills  . etonogestrel-ethinyl estradiol (NUVARING) 0.12-0.015 MG/24HR vaginal ring 1 each 6    Sig: INSERT VAGINALLY AND LEAVE IN PLACE FOR 3 CONSECUTIVE WEEKS, THEN REMOVE FOR 1 WEEK.     OB/GYN:  Contraceptives Passed - 01/19/2022  3:51 PM      Passed - Last BP in normal range    BP Readings from Last 1 Encounters:  06/13/21 127/86         Passed - Valid encounter within last 12 months    Recent Outpatient Visits          7 months ago Dysuria   [facility] Grayce Sessions, NP   2 years ago Adjustment disorder with mixed anxiety and depressed mood   [facility] Grayce Sessions, NP   2 years ago Candidal intertrigo   [facility] Grayce Sessions, NP   3 years ago Menorrhagia with regular cycle   [facility] Grayce Sessions, NP   3 years ago Candidal intertrigo   [facility] Loletta Specter, PA-C      Future Appointments            In 3 weeks Grayce Sessions, NP [facility]           Passed - Patient is not a smoker      . terconazole (TERAZOL 7) 0.4 % vaginal cream 45 g 0    Sig: Place 1 applicator vaginally at bedtime.     Off-Protocol Failed - 01/19/2022  3:51 PM      Failed - Medication not assigned to a protocol, review manually.      Passed - Valid encounter within last 12 months    Recent Outpatient Visits          7 months ago Dysuria   [facility] Grayce Sessions, NP   2 years ago Adjustment disorder with mixed anxiety and depressed mood   [facility] Grayce Sessions, NP   2 years ago Candidal intertrigo   [facility] Grayce Sessions, NP   3 years ago Menorrhagia with regular cycle   [facility] Grayce Sessions, NP   3 years ago Candidal  intertrigo   [facility] Loletta Specter, PA-C      Future Appointments            In 3 weeks Grayce Sessions, NP [facility]

## 2022-01-20 NOTE — Telephone Encounter (Signed)
Late entry..   Patient informed on 7/27 refills refused as she needed to be seen and evaluated for symptoms. As well as have pregnancy test performed since last rx for Nuvaring was 10/2020 and was not written for a yr supply. Patient was upset but advised to go to urgent care since office could not see her until 8/21. On 7/28 office received call from pharmacy questioning refill that was called in today. Spoke with [facility] agent and per their protocol it was ok to refill meds. Explained that testing was required per PCP and that pharmacy had been informed to not fill. If patient calls back upset please route call directly to office. Maryjean Morn, CMA

## 2022-02-06 ENCOUNTER — Other Ambulatory Visit (HOSPITAL_COMMUNITY)
Admission: RE | Admit: 2022-02-06 | Discharge: 2022-02-06 | Disposition: A | Discharge status: Home / Self Care | Payer: Self-pay | Source: Ambulatory Visit | Attending: Obstetrics & Gynecology | Admitting: Obstetrics & Gynecology

## 2022-02-06 ENCOUNTER — Ambulatory Visit (INDEPENDENT_AMBULATORY_CARE_PROVIDER_SITE_OTHER): Payer: Self-pay | Admitting: Obstetrics and Gynecology

## 2022-02-06 ENCOUNTER — Other Ambulatory Visit: Payer: Self-pay

## 2022-02-06 ENCOUNTER — Encounter: Payer: Self-pay | Admitting: Obstetrics and Gynecology

## 2022-02-06 DIAGNOSIS — Z01419 Encounter for gynecological examination (general) (routine) without abnormal findings: Secondary | ICD-10-CM | POA: Insufficient documentation

## 2022-02-06 DIAGNOSIS — Z3044 Encounter for surveillance of vaginal ring hormonal contraceptive device: Secondary | ICD-10-CM

## 2022-02-06 DIAGNOSIS — Z309 Encounter for contraceptive management, unspecified: Secondary | ICD-10-CM | POA: Insufficient documentation

## 2022-02-06 MED ORDER — ETONOGESTREL-ETHINYL ESTRADIOL 0.12-0.015 MG/24HR VA RING
VAGINAL_RING | VAGINAL | 12 refills | Status: DC
  Filled 2022-02-06: qty 1, 28d supply, fill #0
  Filled 2022-03-16: qty 1, 28d supply, fill #1
  Filled 2022-04-27: qty 1, 28d supply, fill #2

## 2022-02-06 NOTE — Patient Instructions (Signed)

## 2022-02-06 NOTE — Progress Notes (Signed)
NGYN presents for annual reports vaginal itching and burning.

## 2022-02-06 NOTE — Progress Notes (Signed)
Nancy [name] is a 41 y.o. [id number] female here for a routine annual gynecologic exam.  Current complaints: yeast infection.   Denies abnormal vaginal bleeding, discharge, pelvic pain, problems with intercourse or other gynecologic concerns.    Gynecologic History Patient's last menstrual period was 02/02/2022. Contraception: NuvaRing vaginal inserts Last Pap: NA.   Obstetric History OB History  Gravida Para Term Preterm AB Living  3 3 3  0 0 3  SAB IAB Ectopic Multiple Live Births  0 0 0 0 3    # Outcome Date GA Lbr Len/2nd Weight Sex Delivery Anes PTL Lv  3 Term 02/12/09 [redacted]w[redacted]d   F Vag-Spont   LIV  2 Term 07/06/05 [redacted]w[redacted]d   M CS-LTranv   LIV  1 Term 05/23/00 [redacted]w[redacted]d   M Vag-Spont   LIV    Past Medical History:  Diagnosis Date   Anemia    Gallstones     Past Surgical History:  Procedure Laterality Date   CESAREAN SECTION     CHOLECYSTECTOMY N/A 10/13/2012   Procedure: LAPAROSCOPIC CHOLECYSTECTOMY WITH INTRAOPERATIVE CHOLANGIOGRAM;  Surgeon: 10/15/2012, MD;  Location: [facility];  Service: General;  Laterality: N/A;    Current Outpatient Medications on File Prior to Visit  Medication Sig Dispense Refill   etonogestrel-ethinyl estradiol (NUVARING) 0.12-0.015 MG/24HR vaginal ring INSERT VAGINALLY AND LEAVE IN PLACE FOR 3 CONSECUTIVE WEEKS, THEN REMOVE FOR 1 WEEK. 1 each 1   metFORMIN (GLUCOPHAGE) 500 MG tablet Take by mouth 2 (two) times daily with a meal.     empagliflozin (JARDIANCE) 25 MG TABS tablet Take 1 tablet (25 mg total) by mouth daily before breakfast. (Patient not taking: Reported on 02/06/2022) 90 tablet 1   glipiZIDE (GLUCOTROL) 10 MG tablet Take 1 tablet (10 mg total) by mouth 2 (two) times daily before a meal. (Patient not taking: Reported on 02/06/2022) 180 tablet 1   glucose blood (TRUE METRIX BLOOD GLUCOSE TEST) test strip Use as instructed (Patient not taking: Reported on 02/06/2022) 100 each 12   TRUEplus Lancets 28G MISC Use as directed twice daily. (Patient  not taking: Reported on 02/06/2022) 100 each 0   No current facility-administered medications on file prior to visit.      Social History   Socioeconomic History   Marital status: Married    Spouse name: Not on file   Number of children: Not on file   Years of education: Not on file   Highest education level: Not on file  Occupational History   Not on file  Tobacco Use   Smoking status: Never    Passive exposure: Never   Smokeless tobacco: Never  Vaping Use   Vaping Use: Never used  Substance and Sexual Activity   Alcohol use: No   Drug use: No   Sexual activity: Yes    Birth control/protection: None  Other Topics Concern   Not on file  Social History Narrative   Not on file   Social Determinants of Health   Financial Resource Strain: Not on file  Food Insecurity: Not on file  Transportation Needs: Not on file  Physical Activity: Not on file  Stress: Not on file  Social Connections: Not on file  Intimate Partner Violence: Not on file    History reviewed. No pertinent family history.  The following portions of the patient's history were reviewed and updated as appropriate: allergies, current medications, past family history, past medical history, past social history, past surgical history and problem list.  Review of Systems  Pertinent items noted in HPI and remainder of comprehensive ROS otherwise negative.   Objective:  BP (!) 142/86   Pulse 99   Ht 5\' 1"  (1.549 m)   Wt 257 lb 8 oz (116.8 kg)   LMP 02/02/2022   BMI 48.65 kg/m  Chaperone present CONSTITUTIONAL: Well-developed, well-nourished female in no acute distress.  HENT:  Normocephalic, atraumatic, External right and left ear normal. Oropharynx is clear and moist EYES: Conjunctivae and EOM are normal. Pupils are equal, round, and reactive to light. No scleral icterus.  NECK: Normal range of motion, supple, no masses.  Normal thyroid.  SKIN: Skin is warm and dry. No rash noted. Not diaphoretic. No  erythema. No pallor. NEUROLGIC: Alert and oriented to person, place, and time. Normal reflexes, muscle tone coordination. No cranial nerve deficit noted. PSYCHIATRIC: Normal mood and affect. Normal behavior. Normal judgment and thought content. CARDIOVASCULAR: Normal heart rate noted, regular rhythm RESPIRATORY: Clear to auscultation bilaterally. Effort and breath sounds normal, no problems with respiration noted. BREASTS: Symmetric in size. No masses, skin changes, nipple drainage, or lymphadenopathy. ABDOMEN: Soft, normal bowel sounds, no distention noted.  No tenderness, rebound or guarding.  PELVIC: Normal appearing external genitalia; normal appearing vaginal mucosa and cervix.  No abnormal discharge noted.  Pap smear obtained.  Normal uterine size, no other palpable masses, no uterine or adnexal tenderness. Limited by pt habitus MUSCULOSKELETAL: Normal range of motion. No tenderness.  No cyanosis, clubbing, or edema.  2+ distal pulses.   Assessment:  Annual gynecologic examination with pap smear Contraception management Plan:  Will follow up results of pap smear and manage accordingly. Pt declined mammogram. Continue with Nuvaring. Discussed relationship with uncontrolled Dm and yeast infection. Also increased vaginal discharge with Nuvaring and that all vaginal discharge does not mean infection Routine preventative health maintenance measures emphasized. Please refer to After Visit Summary for other counseling recommendations.    04/04/2022, MD, FACOG Attending Obstetrician & Gynecologist Center for [facility], [facility]

## 2022-02-07 ENCOUNTER — Other Ambulatory Visit: Payer: Self-pay

## 2022-02-07 ENCOUNTER — Other Ambulatory Visit: Payer: Self-pay | Admitting: *Deleted

## 2022-02-07 ENCOUNTER — Telehealth: Payer: Self-pay | Admitting: *Deleted

## 2022-02-07 DIAGNOSIS — N898 Other specified noninflammatory disorders of vagina: Secondary | ICD-10-CM

## 2022-02-07 DIAGNOSIS — B9689 Other specified bacterial agents as the cause of diseases classified elsewhere: Secondary | ICD-10-CM

## 2022-02-07 LAB — CERVICOVAGINAL ANCILLARY ONLY
Bacterial Vaginitis (gardnerella): POSITIVE — AB
Candida Glabrata: NEGATIVE
Candida Vaginitis: NEGATIVE
Chlamydia: NEGATIVE
Comment: NEGATIVE
Comment: NEGATIVE
Comment: NEGATIVE
Comment: NEGATIVE
Comment: NEGATIVE
Comment: NORMAL
Neisseria Gonorrhea: NEGATIVE
Trichomonas: NEGATIVE

## 2022-02-07 MED ORDER — TERCONAZOLE 0.8 % VA CREA
1.0000 | TOPICAL_CREAM | Freq: Every day | VAGINAL | 0 refills | Status: DC
  Filled 2022-02-07: qty 20, 3d supply, fill #0

## 2022-02-07 MED ORDER — METRONIDAZOLE 0.75 % VA GEL
1.0000 | Freq: Every day | VAGINAL | 0 refills | Status: AC
  Filled 2022-02-07: qty 70, 7d supply, fill #0

## 2022-02-07 NOTE — Telephone Encounter (Signed)
Returned TC to pt.using [place] interpreter. Pt was saying that RX yeast not at pharmacy. Explained that vaginal swab was positive for BV not yeast. Pt insistent on RX for yeast as well. Dr. Alysia Penna consulted. Approved. RX metrogel (pt request) and terconazole sent.

## 2022-02-08 ENCOUNTER — Other Ambulatory Visit: Payer: Self-pay

## 2022-02-08 LAB — CYTOLOGY - PAP
Comment: NEGATIVE
Diagnosis: NEGATIVE
High risk HPV: NEGATIVE

## 2022-02-10 ENCOUNTER — Other Ambulatory Visit: Payer: Self-pay

## 2022-02-13 ENCOUNTER — Ambulatory Visit (INDEPENDENT_AMBULATORY_CARE_PROVIDER_SITE_OTHER): Payer: Self-pay | Admitting: Primary Care

## 2022-02-21 ENCOUNTER — Encounter (INDEPENDENT_AMBULATORY_CARE_PROVIDER_SITE_OTHER): Payer: Self-pay | Admitting: Primary Care

## 2022-03-16 ENCOUNTER — Other Ambulatory Visit: Payer: Self-pay

## 2022-03-20 ENCOUNTER — Other Ambulatory Visit: Payer: Self-pay

## 2022-03-20 ENCOUNTER — Other Ambulatory Visit: Payer: Self-pay | Admitting: Emergency Medicine

## 2022-03-20 ENCOUNTER — Other Ambulatory Visit: Payer: Self-pay | Admitting: Obstetrics and Gynecology

## 2022-03-20 DIAGNOSIS — N898 Other specified noninflammatory disorders of vagina: Secondary | ICD-10-CM

## 2022-03-20 MED ORDER — TERCONAZOLE 0.8 % VA CREA
1.0000 | TOPICAL_CREAM | Freq: Every day | VAGINAL | 1 refills | Status: AC
  Filled 2022-03-20: qty 20, 3d supply, fill #0

## 2022-03-20 MED ORDER — FLUCONAZOLE 150 MG PO TABS
150.0000 mg | ORAL_TABLET | Freq: Once | ORAL | 1 refills | Status: AC
  Filled 2022-03-20: qty 1, 1d supply, fill #0

## 2022-03-27 ENCOUNTER — Other Ambulatory Visit: Payer: Self-pay

## 2022-03-27 ENCOUNTER — Other Ambulatory Visit: Payer: Self-pay | Admitting: *Deleted

## 2022-03-27 DIAGNOSIS — N898 Other specified noninflammatory disorders of vagina: Secondary | ICD-10-CM

## 2022-03-27 MED ORDER — FLUCONAZOLE 150 MG PO TABS
150.0000 mg | ORAL_TABLET | Freq: Once | ORAL | 0 refills | Status: AC
  Filled 2022-03-27: qty 1, 1d supply, fill #0

## 2022-03-27 NOTE — Progress Notes (Signed)
TC from pt's husband reporting pt with continued symptoms of vaginal yeast infection. RX sent on 03/20/22 1 with 1 refill. Husband states pharmacy will not refill due to "expired" RX. RX diflucan X 1 sent. Husband advised Nancy [name] must follow up in the office with a provider for continued symptoms and recurrent infection.

## 2022-03-28 ENCOUNTER — Encounter: Payer: Self-pay | Admitting: Obstetrics & Gynecology

## 2022-03-29 ENCOUNTER — Other Ambulatory Visit: Payer: Self-pay

## 2022-04-27 ENCOUNTER — Other Ambulatory Visit: Payer: Self-pay

## 2022-05-04 ENCOUNTER — Other Ambulatory Visit: Payer: Self-pay

## 2023-05-05 ENCOUNTER — Other Ambulatory Visit: Payer: Self-pay

## 2023-06-18 ENCOUNTER — Other Ambulatory Visit: Payer: Self-pay

## 2023-06-18 ENCOUNTER — Telehealth: Payer: Self-pay

## 2023-06-18 DIAGNOSIS — Z3044 Encounter for surveillance of vaginal ring hormonal contraceptive device: Secondary | ICD-10-CM

## 2023-06-18 MED ORDER — ETONOGESTREL-ETHINYL ESTRADIOL 0.12-0.015 MG/24HR VA RING
VAGINAL_RING | VAGINAL | 12 refills | Status: DC
  Filled 2023-06-18: qty 1, 28d supply, fill #0
  Filled 2023-07-10 – 2023-07-11 (×2): qty 1, 28d supply, fill #1
  Filled 2023-08-09: qty 1, 28d supply, fill #2
  Filled 2023-09-16: qty 1, 28d supply, fill #3
  Filled 2023-10-22 – 2023-11-09 (×3): qty 1, 28d supply, fill #4
  Filled 2023-12-14 – 2023-12-27 (×3): qty 1, 28d supply, fill #5
  Filled 2024-01-28 – 2024-02-19 (×5): qty 1, 28d supply, fill #6
  Filled 2024-03-14 – 2024-03-20 (×4): qty 1, 28d supply, fill #7
  Filled 2024-04-18 (×2): qty 1, 28d supply, fill #8
  Filled 2024-05-16: qty 1, 28d supply, fill #9
  Filled 2024-06-13: qty 1, 28d supply, fill #10

## 2023-06-18 NOTE — Telephone Encounter (Signed)
Pt calling requesting refill of her nuvaring, Pt stating that pharmacy advised her to call us for that refill    Refill sent in for patient

## 2023-07-11 ENCOUNTER — Other Ambulatory Visit: Payer: Self-pay

## 2023-07-16 ENCOUNTER — Other Ambulatory Visit: Payer: Self-pay

## 2023-08-09 ENCOUNTER — Other Ambulatory Visit: Payer: Self-pay

## 2023-08-10 ENCOUNTER — Other Ambulatory Visit: Payer: Self-pay

## 2023-09-17 ENCOUNTER — Other Ambulatory Visit: Payer: Self-pay

## 2023-09-26 ENCOUNTER — Other Ambulatory Visit: Payer: Self-pay

## 2023-09-27 ENCOUNTER — Other Ambulatory Visit: Payer: Self-pay

## 2023-10-22 ENCOUNTER — Other Ambulatory Visit: Payer: Self-pay

## 2023-11-09 ENCOUNTER — Other Ambulatory Visit: Payer: Self-pay

## 2023-12-14 ENCOUNTER — Other Ambulatory Visit: Payer: Self-pay

## 2023-12-17 ENCOUNTER — Other Ambulatory Visit: Payer: Self-pay

## 2023-12-26 ENCOUNTER — Other Ambulatory Visit: Payer: Self-pay

## 2023-12-27 ENCOUNTER — Other Ambulatory Visit: Payer: Self-pay

## 2024-01-28 ENCOUNTER — Other Ambulatory Visit: Payer: Self-pay

## 2024-01-29 ENCOUNTER — Other Ambulatory Visit: Payer: Self-pay

## 2024-02-06 ENCOUNTER — Other Ambulatory Visit: Payer: Self-pay

## 2024-02-15 ENCOUNTER — Other Ambulatory Visit: Payer: Self-pay

## 2024-02-19 ENCOUNTER — Other Ambulatory Visit: Payer: Self-pay

## 2024-02-19 MED ORDER — SULFAMETHOXAZOLE-TRIMETHOPRIM 800-160 MG PO TABS
1.0000 | ORAL_TABLET | Freq: Two times a day (BID) | ORAL | 0 refills | Status: AC
  Filled 2024-02-19: qty 10, 5d supply, fill #0

## 2024-02-19 MED ORDER — FLUCONAZOLE 150 MG PO TABS
150.0000 mg | ORAL_TABLET | ORAL | 0 refills | Status: AC
  Filled 2024-02-19: qty 2, 6d supply, fill #0

## 2024-02-19 MED ORDER — PHENAZOPYRIDINE HCL 200 MG PO TABS
200.0000 mg | ORAL_TABLET | Freq: Three times a day (TID) | ORAL | 0 refills | Status: AC
  Filled 2024-02-19: qty 6, 2d supply, fill #0

## 2024-03-14 ENCOUNTER — Other Ambulatory Visit: Payer: Self-pay

## 2024-03-17 ENCOUNTER — Other Ambulatory Visit: Payer: Self-pay

## 2024-03-18 ENCOUNTER — Other Ambulatory Visit: Payer: Self-pay

## 2024-03-19 ENCOUNTER — Other Ambulatory Visit: Payer: Self-pay

## 2024-03-20 ENCOUNTER — Other Ambulatory Visit: Payer: Self-pay

## 2024-03-21 ENCOUNTER — Other Ambulatory Visit: Payer: Self-pay

## 2024-03-31 ENCOUNTER — Other Ambulatory Visit: Payer: Self-pay

## 2024-04-18 ENCOUNTER — Other Ambulatory Visit: Payer: Self-pay

## 2024-04-21 ENCOUNTER — Other Ambulatory Visit: Payer: Self-pay

## 2024-04-29 ENCOUNTER — Other Ambulatory Visit: Payer: Self-pay

## 2024-05-16 ENCOUNTER — Other Ambulatory Visit: Payer: Self-pay

## 2024-05-19 ENCOUNTER — Other Ambulatory Visit: Payer: Self-pay

## 2024-06-13 ENCOUNTER — Other Ambulatory Visit: Payer: Self-pay

## 2024-06-16 ENCOUNTER — Other Ambulatory Visit: Payer: Self-pay

## 2024-07-14 ENCOUNTER — Other Ambulatory Visit: Payer: Self-pay | Admitting: Obstetrics and Gynecology

## 2024-07-14 ENCOUNTER — Other Ambulatory Visit: Payer: Self-pay

## 2024-07-14 DIAGNOSIS — Z3044 Encounter for surveillance of vaginal ring hormonal contraceptive device: Secondary | ICD-10-CM

## 2024-07-15 ENCOUNTER — Other Ambulatory Visit: Payer: Self-pay

## 2024-07-15 MED ORDER — ETONOGESTREL-ETHINYL ESTRADIOL 0.12-0.015 MG/24HR VA RING
VAGINAL_RING | VAGINAL | 0 refills | Status: AC
  Filled 2024-07-15: qty 3, 84d supply, fill #0

## 2024-07-16 ENCOUNTER — Telehealth: Payer: Self-pay

## 2024-07-16 ENCOUNTER — Other Ambulatory Visit: Payer: Self-pay

## 2024-08-18 ENCOUNTER — Ambulatory Visit: Payer: Self-pay | Admitting: Obstetrics and Gynecology
